# Patient Record
Sex: Male | Born: 1950 | Race: White | Hispanic: No | Marital: Married | Smoking: Former smoker
Health system: Southern US, Community
[De-identification: ages and names within clinical notes are randomized; demographics above are authoritative.]

## PROBLEM LIST (undated history)

## (undated) DIAGNOSIS — E538 Deficiency of other specified B group vitamins: Secondary | ICD-10-CM

## (undated) DIAGNOSIS — E785 Hyperlipidemia, unspecified: Secondary | ICD-10-CM

## (undated) DIAGNOSIS — G20A1 Parkinson's disease without dyskinesia, without mention of fluctuations: Secondary | ICD-10-CM

## (undated) DIAGNOSIS — H269 Unspecified cataract: Secondary | ICD-10-CM

## (undated) DIAGNOSIS — G500 Trigeminal neuralgia: Secondary | ICD-10-CM

## (undated) DIAGNOSIS — G20 Parkinson's disease: Secondary | ICD-10-CM

## (undated) DIAGNOSIS — M1990 Unspecified osteoarthritis, unspecified site: Secondary | ICD-10-CM

## (undated) DIAGNOSIS — I10 Essential (primary) hypertension: Secondary | ICD-10-CM

## (undated) DIAGNOSIS — K219 Gastro-esophageal reflux disease without esophagitis: Secondary | ICD-10-CM

## (undated) DIAGNOSIS — F32A Depression, unspecified: Secondary | ICD-10-CM

## (undated) DIAGNOSIS — C801 Malignant (primary) neoplasm, unspecified: Secondary | ICD-10-CM

## (undated) DIAGNOSIS — E039 Hypothyroidism, unspecified: Secondary | ICD-10-CM

## (undated) DIAGNOSIS — N189 Chronic kidney disease, unspecified: Secondary | ICD-10-CM

## (undated) HISTORY — DX: Malignant (primary) neoplasm, unspecified: C80.1

## (undated) HISTORY — PX: POLYPECTOMY: SHX149

## (undated) HISTORY — DX: Trigeminal neuralgia: G50.0

## (undated) HISTORY — DX: Essential (primary) hypertension: I10

## (undated) HISTORY — DX: Unspecified cataract: H26.9

## (undated) HISTORY — DX: Deficiency of other specified B group vitamins: E53.8

## (undated) HISTORY — DX: Hypothyroidism, unspecified: E03.9

## (undated) HISTORY — DX: Gastro-esophageal reflux disease without esophagitis: K21.9

## (undated) HISTORY — DX: Parkinson's disease without dyskinesia, without mention of fluctuations: G20.A1

## (undated) HISTORY — PX: COLONOSCOPY: SHX174

## (undated) HISTORY — DX: Parkinson's disease: G20

## (undated) HISTORY — DX: Hyperlipidemia, unspecified: E78.5

---
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
## 1989-05-31 HISTORY — PX: OTHER SURGICAL HISTORY: SHX169

## 1993-05-31 HISTORY — PX: OTHER SURGICAL HISTORY: SHX169

## 2003-06-01 HISTORY — PX: OTHER SURGICAL HISTORY: SHX169

## 2004-05-20 ENCOUNTER — Ambulatory Visit: Payer: Self-pay | Admitting: Internal Medicine

## 2004-08-03 ENCOUNTER — Ambulatory Visit: Payer: Self-pay | Admitting: Internal Medicine

## 2005-01-11 ENCOUNTER — Ambulatory Visit: Payer: Self-pay | Admitting: Internal Medicine

## 2005-07-20 ENCOUNTER — Encounter: Admission: RE | Admit: 2005-07-20 | Discharge: 2005-07-20 | Payer: Self-pay | Admitting: Neurology

## 2005-08-20 ENCOUNTER — Ambulatory Visit: Payer: Self-pay | Admitting: Endocrinology

## 2006-02-08 ENCOUNTER — Ambulatory Visit: Payer: Self-pay | Admitting: Internal Medicine

## 2006-05-31 HISTORY — PX: TRIGEMINAL NERVE DECOMPRESSION: SHX2579

## 2006-07-28 ENCOUNTER — Ambulatory Visit: Payer: Self-pay | Admitting: Internal Medicine

## 2006-09-02 ENCOUNTER — Ambulatory Visit: Payer: Self-pay | Admitting: Internal Medicine

## 2006-09-02 LAB — CONVERTED CEMR LAB
Basophils Absolute: 0 10*3/uL (ref 0.0–0.1)
Basophils Relative: 0.4 % (ref 0.0–1.0)
Bilirubin, Direct: 0.1 mg/dL (ref 0.0–0.3)
Eosinophils Absolute: 0.1 10*3/uL (ref 0.0–0.6)
Eosinophils Relative: 2.8 % (ref 0.0–5.0)
Hemoglobin: 14 g/dL (ref 13.0–17.0)
Monocytes Absolute: 0.5 10*3/uL (ref 0.2–0.7)
Neutro Abs: 1.9 10*3/uL (ref 1.4–7.7)
Neutrophils Relative %: 46.1 % (ref 43.0–77.0)
Platelets: 160 10*3/uL (ref 150–400)
WBC: 4.2 10*3/uL — ABNORMAL LOW (ref 4.5–10.5)

## 2006-09-23 ENCOUNTER — Ambulatory Visit: Payer: Self-pay | Admitting: Internal Medicine

## 2006-10-01 ENCOUNTER — Ambulatory Visit: Payer: Self-pay | Admitting: Family Medicine

## 2007-01-16 ENCOUNTER — Encounter
Admission: RE | Admit: 2007-01-16 | Discharge: 2007-04-16 | Payer: Self-pay | Admitting: Physical Medicine & Rehabilitation

## 2007-01-17 ENCOUNTER — Ambulatory Visit: Payer: Self-pay | Admitting: Physical Medicine & Rehabilitation

## 2007-01-23 ENCOUNTER — Ambulatory Visit: Payer: Self-pay | Admitting: Internal Medicine

## 2007-01-23 LAB — CONVERTED CEMR LAB
ALT: 27 units/L (ref 0–53)
AST: 20 units/L (ref 0–37)
Basophils Relative: 0.4 % (ref 0.0–1.0)
CO2: 27 meq/L (ref 19–32)
Creatinine, Ser: 1.1 mg/dL (ref 0.4–1.5)
Eosinophils Relative: 4.1 % (ref 0.0–5.0)
GFR calc non Af Amer: 74 mL/min
Glucose, Bld: 106 mg/dL — ABNORMAL HIGH (ref 70–99)
HCT: 40.5 % (ref 39.0–52.0)
HDL: 24 mg/dL — ABNORMAL LOW (ref >39.0)
Hemoglobin, Urine: NEGATIVE
Ketones, ur: NEGATIVE mg/dL
LDL Cholesterol: 68 mg/dL (ref 0–99)
Lymphocytes Relative: 44 % (ref 12.0–46.0)
MCV: 88.5 fL (ref 78.0–100.0)
Neutrophils Relative %: 40.2 % — ABNORMAL LOW (ref 43.0–77.0)
Nitrite: NEGATIVE
PSA: 0.65 ng/mL (ref 0.10–4.00)
RBC: 4.58 M/uL (ref 4.22–5.81)
Specific Gravity, Urine: >=1.03 (ref 1.000–1.03)
Total Bilirubin: 0.7 mg/dL (ref 0.3–1.2)
Total Protein: 6.4 g/dL (ref 6.0–8.3)
Triglycerides: 102 mg/dL (ref 0–149)
Urobilinogen, UA: 0.2 (ref 0.0–1.0)
VLDL: 20 mg/dL (ref 0–40)
WBC: 4.7 10*3/uL (ref 4.5–10.5)

## 2007-01-26 ENCOUNTER — Ambulatory Visit: Payer: Self-pay | Admitting: Internal Medicine

## 2007-01-27 ENCOUNTER — Encounter: Payer: Self-pay | Admitting: Internal Medicine

## 2007-01-27 DIAGNOSIS — I10 Essential (primary) hypertension: Secondary | ICD-10-CM

## 2007-01-27 DIAGNOSIS — G500 Trigeminal neuralgia: Secondary | ICD-10-CM | POA: Insufficient documentation

## 2007-01-27 DIAGNOSIS — J309 Allergic rhinitis, unspecified: Secondary | ICD-10-CM | POA: Insufficient documentation

## 2007-01-27 DIAGNOSIS — K219 Gastro-esophageal reflux disease without esophagitis: Secondary | ICD-10-CM

## 2007-02-28 ENCOUNTER — Ambulatory Visit: Payer: Self-pay | Admitting: Physical Medicine & Rehabilitation

## 2007-03-18 ENCOUNTER — Encounter: Payer: Self-pay | Admitting: Internal Medicine

## 2007-04-19 ENCOUNTER — Encounter
Admission: RE | Admit: 2007-04-19 | Discharge: 2007-05-19 | Payer: Self-pay | Admitting: Physical Medicine & Rehabilitation

## 2007-04-19 ENCOUNTER — Ambulatory Visit: Payer: Self-pay | Admitting: Physical Medicine & Rehabilitation

## 2007-05-17 ENCOUNTER — Ambulatory Visit: Payer: Self-pay | Admitting: Physical Medicine & Rehabilitation

## 2007-06-14 ENCOUNTER — Encounter
Admission: RE | Admit: 2007-06-14 | Discharge: 2007-09-12 | Payer: Self-pay | Admitting: Physical Medicine & Rehabilitation

## 2007-07-26 ENCOUNTER — Ambulatory Visit: Payer: Self-pay | Admitting: Physical Medicine & Rehabilitation

## 2007-09-07 ENCOUNTER — Ambulatory Visit: Payer: Self-pay | Admitting: Physical Medicine & Rehabilitation

## 2007-09-18 ENCOUNTER — Ambulatory Visit: Payer: Self-pay | Admitting: Internal Medicine

## 2007-09-18 DIAGNOSIS — R310 Gross hematuria: Secondary | ICD-10-CM

## 2007-09-18 LAB — CONVERTED CEMR LAB
Basophils Relative: 0.8 % (ref 0.0–1.0)
Bilirubin Urine: NEGATIVE
Calcium: 8.8 mg/dL (ref 8.4–10.5)
Crystals: NEGATIVE
Eosinophils Relative: 3.6 % (ref 0.0–5.0)
GFR calc non Af Amer: 61 mL/min
Glucose, Bld: 126 mg/dL — ABNORMAL HIGH (ref 70–99)
MCHC: 34.4 g/dL (ref 30.0–36.0)
MCV: 88.3 fL (ref 78.0–100.0)
Monocytes Absolute: 0.5 10*3/uL (ref 0.1–1.0)
Monocytes Relative: 8.4 % (ref 3.0–12.0)
Neutro Abs: 2.9 10*3/uL (ref 1.4–7.7)
Neutrophils Relative %: 53.2 % (ref 43.0–77.0)
RDW: 12.4 % (ref 11.5–14.6)
Sed Rate: 8 mm/hr (ref 0–16)
Sodium: 141 meq/L (ref 135–145)
Total Protein, Urine: NEGATIVE mg/dL
Urine Glucose: NEGATIVE mg/dL
Urobilinogen, UA: 0.2 (ref 0.0–1.0)
WBC: 5.5 10*3/uL (ref 4.5–10.5)
pH: 5.5 (ref 5.0–8.0)

## 2007-09-19 ENCOUNTER — Telehealth: Payer: Self-pay | Admitting: Internal Medicine

## 2007-09-20 ENCOUNTER — Telehealth: Payer: Self-pay | Admitting: Internal Medicine

## 2007-09-21 ENCOUNTER — Encounter: Payer: Self-pay | Admitting: Internal Medicine

## 2007-10-03 ENCOUNTER — Ambulatory Visit: Payer: Self-pay | Admitting: Internal Medicine

## 2007-10-03 DIAGNOSIS — W57XXXA Bitten or stung by nonvenomous insect and other nonvenomous arthropods, initial encounter: Secondary | ICD-10-CM

## 2007-10-03 DIAGNOSIS — T148 Other injury of unspecified body region: Secondary | ICD-10-CM | POA: Insufficient documentation

## 2007-10-11 ENCOUNTER — Encounter: Payer: Self-pay | Admitting: Internal Medicine

## 2007-10-17 ENCOUNTER — Encounter
Admission: RE | Admit: 2007-10-17 | Discharge: 2008-01-15 | Payer: Self-pay | Admitting: Physical Medicine & Rehabilitation

## 2007-10-19 ENCOUNTER — Ambulatory Visit: Payer: Self-pay | Admitting: Physical Medicine & Rehabilitation

## 2007-10-25 ENCOUNTER — Ambulatory Visit: Payer: Self-pay | Admitting: Internal Medicine

## 2007-10-26 ENCOUNTER — Ambulatory Visit: Payer: Self-pay | Admitting: Physical Medicine & Rehabilitation

## 2007-11-17 ENCOUNTER — Ambulatory Visit: Payer: Self-pay | Admitting: Physical Medicine & Rehabilitation

## 2007-11-17 ENCOUNTER — Telehealth: Payer: Self-pay | Admitting: Internal Medicine

## 2007-12-11 ENCOUNTER — Ambulatory Visit: Payer: Self-pay | Admitting: Physical Medicine & Rehabilitation

## 2007-12-19 ENCOUNTER — Encounter: Payer: Self-pay | Admitting: Internal Medicine

## 2007-12-26 ENCOUNTER — Ambulatory Visit: Payer: Self-pay | Admitting: Physical Medicine & Rehabilitation

## 2007-12-26 ENCOUNTER — Ambulatory Visit: Payer: Self-pay | Admitting: Internal Medicine

## 2007-12-26 LAB — CONVERTED CEMR LAB
CO2: 27 meq/L (ref 19–32)
Chloride: 103 meq/L (ref 96–112)
Creatinine, Ser: 1.2 mg/dL (ref 0.4–1.5)

## 2007-12-27 ENCOUNTER — Encounter: Admission: RE | Admit: 2007-12-27 | Discharge: 2007-12-27 | Payer: Self-pay | Admitting: Internal Medicine

## 2007-12-28 ENCOUNTER — Telehealth (INDEPENDENT_AMBULATORY_CARE_PROVIDER_SITE_OTHER): Payer: Self-pay | Admitting: *Deleted

## 2007-12-29 ENCOUNTER — Encounter: Payer: Self-pay | Admitting: Internal Medicine

## 2008-01-01 ENCOUNTER — Ambulatory Visit: Payer: Self-pay | Admitting: Internal Medicine

## 2008-01-03 ENCOUNTER — Telehealth: Payer: Self-pay | Admitting: Internal Medicine

## 2008-01-11 ENCOUNTER — Emergency Department (HOSPITAL_COMMUNITY): Admission: EM | Admit: 2008-01-11 | Discharge: 2008-01-11 | Payer: Self-pay | Admitting: Family Medicine

## 2008-01-12 ENCOUNTER — Telehealth (INDEPENDENT_AMBULATORY_CARE_PROVIDER_SITE_OTHER): Payer: Self-pay | Admitting: *Deleted

## 2008-03-12 ENCOUNTER — Telehealth: Payer: Self-pay | Admitting: Internal Medicine

## 2008-05-31 DIAGNOSIS — E039 Hypothyroidism, unspecified: Secondary | ICD-10-CM

## 2008-05-31 DIAGNOSIS — E538 Deficiency of other specified B group vitamins: Secondary | ICD-10-CM

## 2008-05-31 HISTORY — DX: Hypothyroidism, unspecified: E03.9

## 2008-05-31 HISTORY — DX: Deficiency of other specified B group vitamins: E53.8

## 2008-06-28 ENCOUNTER — Telehealth: Payer: Self-pay | Admitting: Internal Medicine

## 2008-07-30 ENCOUNTER — Ambulatory Visit: Payer: Self-pay | Admitting: Internal Medicine

## 2008-07-30 LAB — CONVERTED CEMR LAB
AST: 22 units/L (ref 0–37)
Alkaline Phosphatase: 45 units/L (ref 39–117)
BUN: 14 mg/dL (ref 6–23)
Basophils Relative: 0.5 % (ref 0.0–3.0)
Bilirubin Urine: NEGATIVE
Bilirubin, Direct: 0.1 mg/dL (ref 0.0–0.3)
Calcium: 9.7 mg/dL (ref 8.4–10.5)
Cholesterol: 159 mg/dL (ref 0–200)
Eosinophils Absolute: 0.1 10*3/uL (ref 0.0–0.7)
Eosinophils Relative: 2.9 % (ref 0.0–5.0)
GFR calc Af Amer: 80 mL/min
Glucose, Bld: 123 mg/dL — ABNORMAL HIGH (ref 70–99)
Hemoglobin: 14.4 g/dL (ref 13.0–17.0)
Ketones, ur: NEGATIVE mg/dL
LDL Cholesterol: 97 mg/dL (ref 0–99)
Leukocytes, UA: NEGATIVE
Lymphocytes Relative: 41.1 % (ref 12.0–46.0)
Monocytes Absolute: 0.5 10*3/uL (ref 0.1–1.0)
Neutro Abs: 1.9 10*3/uL (ref 1.4–7.7)
Neutrophils Relative %: 44 % (ref 43.0–77.0)
Nitrite: NEGATIVE
RBC: 4.68 M/uL (ref 4.22–5.81)
Specific Gravity, Urine: 1.02 (ref 1.000–1.035)
TSH: 5.07 microintl units/mL (ref 0.35–5.50)
Total Bilirubin: 0.8 mg/dL (ref 0.3–1.2)
Total CHOL/HDL Ratio: 5.5
Triglycerides: 169 mg/dL — ABNORMAL HIGH (ref 0–149)
Urine Glucose: NEGATIVE mg/dL
Urobilinogen, UA: 0.2 (ref 0.0–1.0)
VLDL: 34 mg/dL (ref 0–40)

## 2008-08-05 ENCOUNTER — Ambulatory Visit: Payer: Self-pay | Admitting: Internal Medicine

## 2008-08-05 DIAGNOSIS — R7989 Other specified abnormal findings of blood chemistry: Secondary | ICD-10-CM | POA: Insufficient documentation

## 2008-08-05 DIAGNOSIS — E785 Hyperlipidemia, unspecified: Secondary | ICD-10-CM

## 2008-08-29 ENCOUNTER — Telehealth: Payer: Self-pay | Admitting: Internal Medicine

## 2008-10-10 ENCOUNTER — Telehealth: Payer: Self-pay | Admitting: Internal Medicine

## 2008-11-01 ENCOUNTER — Telehealth: Payer: Self-pay | Admitting: Internal Medicine

## 2008-11-26 ENCOUNTER — Encounter (INDEPENDENT_AMBULATORY_CARE_PROVIDER_SITE_OTHER): Payer: Self-pay | Admitting: *Deleted

## 2008-12-04 ENCOUNTER — Ambulatory Visit: Payer: Self-pay | Admitting: Internal Medicine

## 2008-12-04 LAB — CONVERTED CEMR LAB
BUN: 18 mg/dL (ref 6–23)
Basophils Relative: 0.5 % (ref 0.0–3.0)
CO2: 30 meq/L (ref 19–32)
Calcium: 9.3 mg/dL (ref 8.4–10.5)
Creatinine, Ser: 1.1 mg/dL (ref 0.4–1.5)
Eosinophils Absolute: 0.1 10*3/uL (ref 0.0–0.7)
Eosinophils Relative: 3.1 % (ref 0.0–5.0)
GFR calc non Af Amer: 73.1 mL/min (ref >60)
HCT: 45 % (ref 39.0–52.0)
Hgb A1c MFr Bld: 5.6 % (ref 4.6–6.5)
MCHC: 34.3 g/dL (ref 30.0–36.0)
MCV: 90.7 fL (ref 78.0–100.0)
Neutro Abs: 2.3 10*3/uL (ref 1.4–7.7)
Potassium: 4.4 meq/L (ref 3.5–5.1)
RBC: 4.96 M/uL (ref 4.22–5.81)
RDW: 12.6 % (ref 11.5–14.6)
TSH: 5.21 microintl units/mL (ref 0.35–5.50)
Vitamin B-12: 231 pg/mL (ref 211–911)
WBC: 4.7 10*3/uL (ref 4.5–10.5)

## 2008-12-10 ENCOUNTER — Ambulatory Visit: Payer: Self-pay | Admitting: Internal Medicine

## 2008-12-10 DIAGNOSIS — E039 Hypothyroidism, unspecified: Secondary | ICD-10-CM

## 2008-12-10 DIAGNOSIS — E538 Deficiency of other specified B group vitamins: Secondary | ICD-10-CM | POA: Insufficient documentation

## 2008-12-27 ENCOUNTER — Telehealth: Payer: Self-pay | Admitting: Internal Medicine

## 2009-03-11 ENCOUNTER — Ambulatory Visit: Payer: Self-pay | Admitting: Internal Medicine

## 2009-03-12 LAB — CONVERTED CEMR LAB
ALT: 20 units/L (ref 0–53)
Albumin: 4 g/dL (ref 3.5–5.2)
BUN: 17 mg/dL (ref 6–23)
Basophils Absolute: 0 10*3/uL (ref 0.0–0.1)
Basophils Relative: 0.2 % (ref 0.0–3.0)
Bilirubin, Direct: 0.1 mg/dL (ref 0.0–0.3)
CO2: 29 meq/L (ref 19–32)
Chloride: 103 meq/L (ref 96–112)
Eosinophils Relative: 2.7 % (ref 0.0–5.0)
Glucose, Bld: 103 mg/dL — ABNORMAL HIGH (ref 70–99)
Lymphs Abs: 1.7 10*3/uL (ref 0.7–4.0)
MCHC: 34.4 g/dL (ref 30.0–36.0)
MCV: 91 fL (ref 78.0–100.0)
Monocytes Absolute: 0.5 10*3/uL (ref 0.1–1.0)
Neutro Abs: 2.4 10*3/uL (ref 1.4–7.7)
Platelets: 147 10*3/uL — ABNORMAL LOW (ref 150.0–400.0)
Potassium: 4.7 meq/L (ref 3.5–5.1)
RBC: 4.62 M/uL (ref 4.22–5.81)
Rhuematoid fact SerPl-aCnc: <20 intl units/mL (ref 0.0–20.0)
TSH: 3.32 microintl units/mL (ref 0.35–5.50)

## 2009-03-17 ENCOUNTER — Ambulatory Visit: Payer: Self-pay | Admitting: Internal Medicine

## 2009-03-17 DIAGNOSIS — Z720 Tobacco use: Secondary | ICD-10-CM | POA: Insufficient documentation

## 2009-03-17 DIAGNOSIS — Z87891 Personal history of nicotine dependence: Secondary | ICD-10-CM | POA: Insufficient documentation

## 2009-03-19 ENCOUNTER — Telehealth: Payer: Self-pay | Admitting: Internal Medicine

## 2009-06-17 ENCOUNTER — Ambulatory Visit: Payer: Self-pay | Admitting: Internal Medicine

## 2009-06-17 LAB — CONVERTED CEMR LAB
CO2: 27 meq/L (ref 19–32)
Eosinophils Absolute: 0.1 10*3/uL (ref 0.0–0.7)
Eosinophils Relative: 3.2 % (ref 0.0–5.0)
Glucose, Bld: 100 mg/dL — ABNORMAL HIGH (ref 70–99)
Lymphs Abs: 1.9 10*3/uL (ref 0.7–4.0)
MCHC: 33.8 g/dL (ref 30.0–36.0)
MCV: 91.3 fL (ref 78.0–100.0)
Monocytes Absolute: 0.4 10*3/uL (ref 0.1–1.0)
Monocytes Relative: 9.2 % (ref 3.0–12.0)
Neutro Abs: 1.8 10*3/uL (ref 1.4–7.7)
Platelets: 135 10*3/uL — ABNORMAL LOW (ref 150.0–400.0)
RBC: 4.83 M/uL (ref 4.22–5.81)
WBC: 4.2 10*3/uL — ABNORMAL LOW (ref 4.5–10.5)

## 2009-06-23 ENCOUNTER — Ambulatory Visit: Payer: Self-pay | Admitting: Internal Medicine

## 2009-08-11 ENCOUNTER — Telehealth: Payer: Self-pay | Admitting: Internal Medicine

## 2009-12-11 ENCOUNTER — Ambulatory Visit: Payer: Self-pay | Admitting: Internal Medicine

## 2009-12-11 LAB — CONVERTED CEMR LAB
ALT: 25 units/L (ref 0–53)
AST: 18 units/L (ref 0–37)
BUN: 13 mg/dL (ref 6–23)
Basophils Relative: 0.8 % (ref 0.0–3.0)
Bilirubin Urine: NEGATIVE
CO2: 29 meq/L (ref 19–32)
Calcium: 9.2 mg/dL (ref 8.4–10.5)
Creatinine, Ser: 1.1 mg/dL (ref 0.4–1.5)
Eosinophils Relative: 2.3 % (ref 0.0–5.0)
GFR calc non Af Amer: 76.86 mL/min (ref >60)
Glucose, Bld: 102 mg/dL — ABNORMAL HIGH (ref 70–99)
Lymphocytes Relative: 40.7 % (ref 12.0–46.0)
MCV: 89.7 fL (ref 78.0–100.0)
Neutrophils Relative %: 46 % (ref 43.0–77.0)
Nitrite: NEGATIVE
Specific Gravity, Urine: 1.01 (ref 1.000–1.030)
TSH: 5.14 microintl units/mL (ref 0.35–5.50)
Total Bilirubin: 0.6 mg/dL (ref 0.3–1.2)
Total CHOL/HDL Ratio: 4
Triglycerides: 76 mg/dL (ref 0.0–149.0)
Urine Glucose: NEGATIVE mg/dL
Vitamin B-12: 1077 pg/mL — ABNORMAL HIGH (ref 211–911)
pH: 5.5 (ref 5.0–8.0)

## 2009-12-16 ENCOUNTER — Telehealth: Payer: Self-pay | Admitting: Internal Medicine

## 2009-12-16 ENCOUNTER — Ambulatory Visit: Payer: Self-pay | Admitting: Internal Medicine

## 2009-12-16 DIAGNOSIS — R011 Cardiac murmur, unspecified: Secondary | ICD-10-CM

## 2009-12-19 ENCOUNTER — Encounter: Payer: Self-pay | Admitting: Internal Medicine

## 2010-05-01 ENCOUNTER — Telehealth: Payer: Self-pay | Admitting: Internal Medicine

## 2010-06-09 ENCOUNTER — Ambulatory Visit: Admit: 2010-06-09 | Payer: Self-pay | Admitting: Internal Medicine

## 2010-07-01 NOTE — Assessment & Plan Note (Signed)
Summary: 4-6 MO ROV / NWS #   Vital Signs:  Patient profile:   60 year old male Weight:      214 pounds Temp:     98.3 degrees F oral Pulse rate:   59 / minute BP sitting:   116 / 80  (left arm)  Vitals Entered By: Tora Perches (June 23, 2009 8:24 AM) CC: f/u Is Patient Diabetic? No   CC:  f/u.  Preventive Screening-Counseling & Management  Alcohol-Tobacco     Smoking Status: quit  Current Medications (verified): 1)  Trazodone Hcl 100 Mg Tabs (Trazodone Hcl) .Marland Kitchen.. 1 By Mouth At Bedtime 2)  Claritin 10 Mg Tabs (Loratadine) .... Once Daily 3)  Ranitidine Hcl 150 Mg  Tabs (Ranitidine Hcl) .... Two Times A Day 4)  Vitamin D3 1000 Unit  Tabs (Cholecalciferol) .Marland Kitchen.. 1 By Mouth Daily 5)  Vitamin B-12 Cr 1000 Mcg  Tbcr (Cyanocobalamin) .... Take One Tablet By Mouth Daily 6)  Synthroid 25 Mcg Tabs (Levothyroxine Sodium) .... 1/2 By Mouth Qd 7)  Cozaar 100 Mg Tabs (Losartan Potassium) .Marland Kitchen.. 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  Otoscopic examination reveals clear canals, tympanic membranes are intact bilaterally without bulging, retraction, inflammation or  discharge. Hearing is grossly normal bilaterally. Mouth:  throat clear Lungs:  Normal respiratory effort, chest expands symmetrically. Lungs are clear to auscultation, no crackles or wheezes. Heart:  Normal rate and regular rhythm. S1 and S2 normal without gallop, murmur, click, rub or other extra sounds. Extremities:  No clubbing, cyanosis, edema, or deformity noted with normal full range of motion of all joints.   Neurologic:  No cranial nerve deficits noted. Station and gait are normal. Plantar reflexes are down-going bilaterally. DTRs are symmetrical throughout. Sensory, motor and coordinative functions appear intact. Skin:  several 1-1.5 cm lesions on the abdomen in various stages. Definite surrounding erythema. Cervical Nodes:  No lymphadenopathy noted Inguinal Nodes:  No significant adenopathy Psych:  Cognition and judgment appear intact. Alert and cooperative with normal attention span and concentration. No apparent delusions, illusions, hallucinations   Impression & Recommendations:  Problem # 1:  VITAMIN B12 DEFICIENCY (ICD-266.2)  Problem # 2:  HYPOTHYROIDISM (ICD-244.9)  His updated medication list for this problem includes:    Synthroid 25 Mcg Tabs (Levothyroxine sodium) .Marland Kitchen... 1/2 by mouth qd  Problem # 3:  HYPOTHYROIDISM (ICD-244.9)  His updated medication list for this problem includes:    Synthroid 25 Mcg Tabs (Levothyroxine sodium) .Marland Kitchen... 1/2 by mouth qd  Complete Medication List: 1)  Trazodone Hcl 100 Mg Tabs (Trazodone hcl) .Marland Kitchen.. 1 by mouth at bedtime 2)  Claritin 10 Mg Tabs (Loratadine) .... Once daily 3)  Ranitidine Hcl 150 Mg Tabs (Ranitidine hcl) .... Two times a day 4)  Vitamin D3 1000 Unit Tabs (Cholecalciferol) .Marland Kitchen.. 1 by mouth daily 5)  Vitamin B-12 Cr  1000 Mcg Tbcr (Cyanocobalamin) .... Take one tablet by mouth daily 6)  Synthroid 25 Mcg Tabs (Levothyroxine sodium) .... 1/2 by mouth qd 7)  Cozaar 100 Mg Tabs (Losartan potassium) .Marland Kitchen.. 1 by mouth  daily  Patient Instructions: 1)  Please schedule a follow-up appointment in 4- 6 months well w/labs and B12 v70.0  266.20.

## 2010-07-01 NOTE — Progress Notes (Signed)
Summary: Rf Trazodone  Phone Note From Pharmacy   Caller: [facility] Summary of Call: rec rf request for Trazodone 100mg  1 by mouth at bedtime # 30.  I do not see this on active med list. Please advise Initial call taken by: Lanier Prude, [facility]),  May 01, 2010 1:17 PM  Follow-up for Phone Call        ok x 6 months  Follow-up by: Tresa Garter MD,  May 01, 2010 5:35 PM  Additional Follow-up for Phone Call Additional follow up Details #1::        Pt informed  Additional Follow-up by: Lamar Sprinkles, CMA,  May 01, 2010 5:40 PM    New/Updated Medications: TRAZODONE HCL 100 MG TABS (TRAZODONE HCL) 1 at bedtime as needed Prescriptions: TRAZODONE HCL 100 MG TABS (TRAZODONE HCL) 1 at bedtime as needed  #30 x 5   Entered by:   Lamar Sprinkles, CMA   Authorized by:   Tresa Garter MD   Signed by:   Lamar Sprinkles, CMA on 05/01/2010   Method used:   Telephoned to ...       [facility] [phone]* (retail)       [address] [place], [place]  [address]       Ph: [phone]       Fax: [phone]   RxID:   [id number]

## 2010-07-01 NOTE — Assessment & Plan Note (Signed)
Summary: cpx / # cd   Vital Signs:  Patient profile:   60 year old male Height:      69 inches (175.26 cm) Weight:      212 pounds (96.36 kg) BMI:     31.42 O2 Sat:      96 % on Room air Temp:     97.5 degrees F (36.39 degrees C) oral Pulse rate:   58 / minute Pulse rhythm:   regular Resp:     16 per minute BP sitting:   114 / 82  (left arm) Cuff size:   regular  Vitals Entered By: Lanier Prude, CMA(AAMA) (December 16, 2009 8:40 AM)  O2 Flow:  Room air CC: CPX Is Patient Diabetic? No   Primary Care Provider:  Norins  CC:  CPX.  History of Present Illness: The patient presents for a wellness examination   Current Medications (verified): 1)  Claritin 10 Mg Tabs (Loratadine) .... Once Daily 2)  Ranitidine Hcl 150 Mg  Tabs (Ranitidine Hcl) .... Two Times A Day 3)  Vitamin D3 1000 Unit  Tabs (Cholecalciferol) .Marland Kitchen.. 1 By Mouth Daily 4)  Vitamin B-12 Cr 1000 Mcg  Tbcr (Cyanocobalamin) .... Take One Tablet By Mouth Daily 5)  Synthroid 25 Mcg Tabs (Levothyroxine Sodium) .... 1/2 By Mouth Qd 6)  Cozaar 100 Mg Tabs (Losartan Potassium) .... 1/2 By Mouth Daily 7)  Fish Oil 1200 Mg Caps (Omega-3 Fatty Acids) .Marland Kitchen.. 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: 12 years 1st;  15 years ('93) 2nd one daughter, step daughter, step-son likes to bike  Past Surgical History: Inguinal herniorrhaphy-left Tonsillectomy gamma knife surgery for trigeminal neuralgia 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  Dr Garnette Czech Colon 8 y ago   Social History: Occupation: Presenter, 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: 12 years 1st;  15 years ('93) 2nd one daughter, step daughter, step-son likes to bike  Review of Systems  The patient denies anorexia, fever, weight loss, weight gain, vision loss, decreased hearing, hoarseness, chest pain, syncope, dyspnea on exertion, peripheral edema, prolonged cough, headaches, hemoptysis, abdominal pain, melena, hematochezia, severe indigestion/heartburn, hematuria, incontinence, genital sores, muscle weakness, suspicious skin lesions, transient blindness, difficulty walking, depression, unusual weight change, abnormal bleeding, enlarged lymph nodes, angioedema, and testicular masses.    Physical Exam  General:  Well-developed,well-nourished,in no acute distress; alert,appropriate and cooperative throughout examination Head:  Normocephalic and atraumatic without obvious abnormalities. No apparent alopecia or balding. Eyes:  No corneal or conjunctival inflammation noted. EOMI. Perrla.  Ears:  dry wax B Nose:  External nasal examination shows no deformity or inflammation. Nasal mucosa are pink and moist without lesions or exudates. Mouth:  Oral mucosa and oropharynx without lesions or exudates.  Teeth in good repair. Neck:  No deformities, masses, or tenderness noted. Lungs:  Normal respiratory effort, chest expands symmetrically. Lungs are clear to auscultation, no crackles or wheezes. Heart:  1/6 systolic heart murmur  Abdomen:  Bowel sounds positive,abdomen soft and non-tender without masses, organomegaly or hernias noted. Msk:  No deformity or scoliosis noted of thoracic or lumbar spine.   Extremities:  No clubbing, cyanosis, edema, or deformity noted with normal full range of motion of all joints.   Neurologic:  No cranial nerve deficits noted. Station and gait are normal. Plantar reflexes are down-going bilaterally. DTRs are symmetrical throughout. Sensory,  motor and coordinative functions appear intact. Skin:  several 1-1.5 cm lesions on the abdomen in various stages. Definite surrounding erythema. Cervical Nodes:  No lymphadenopathy noted Inguinal Nodes:  No significant adenopathy Psych:  Cognition and judgment appear intact. Alert and cooperative with normal attention span and concentration. No apparent delusions, illusions, hallucinations   Impression & Recommendations:  Problem # 1:  WELL ADULT EXAM (ICD-V70.0) Assessment New Health and age related issues were discussed. Available screening tests and vaccinations were discussed as well. Healthy life style including good diet and execise was discussed.   Problem # 2:  LVH on EKG/ with a very  faint heart murmur - most likely all nl Assessment: New We can get an ECHO later Get old EKG  Problem # 3:  VITAMIN B12 DEFICIENCY (ICD-266.2) Assessment: Comment Only On drug  therapy   Problem # 4:  HYPOTHYROIDISM (ICD-244.9) Assessment: Comment Only  His updated medication list for this problem includes:    Synthroid 25 Mcg Tabs (Levothyroxine sodium) .Marland Kitchen... 1 by mouth qd  Complete Medication List: 1)  Claritin 10 Mg Tabs (Loratadine) .... Once daily 2)  Ranitidine Hcl 150 Mg Tabs (Ranitidine hcl) .... Two times a day 3)  Vitamin D3 1000 Unit Tabs (Cholecalciferol) .Marland Kitchen.. 1 by mouth daily 4)  Vitamin B-12 Cr 1000 Mcg Tbcr (Cyanocobalamin) .... Take one tablet by mouth daily 5)  Synthroid 25 Mcg Tabs (Levothyroxine sodium) .Marland Kitchen.. 1 by mouth qd 6)  Fish Oil 1200 Mg Caps (Omega-3 fatty acids) .Marland Kitchen.. 1 once daily 7)  Cozaar 50 Mg Tabs (Losartan potassium) .Marland Kitchen.. 1 by mouth qd  Other Orders: EKG w/ Interpretation (93000) TD Toxoids IM 7 YR + ([address]) Admin 1st Vaccine (60454) Echo Referral (Echo)  Patient Instructions: 1)  Please schedule a follow-up appointment in 6 months. 2)  BMP prior to visit, ICD-9: 3)  Lipid Panel prior to visit, ICD-9: 4)  TSH prior to visit, ICD-9: 5)  CBC w/ Diff  prior to visit, ICD-9: 6)  PSA prior to visit, ICD-9: 244.80 266 20 995.20 7)  Vit B12 Prescriptions: SYNTHROID 25 MCG TABS (LEVOTHYROXINE SODIUM) 1 by mouth qd  #30 x 12   Entered and Authorized by:   Tresa Garter MD   Signed by:   Tresa Garter MD on 12/16/2009   Method used:   Electronically to        [facility]  [phone]* (retail)       [address]       [place], [place]  [address]       Ph: [phone] or [phone]       Fax: [phone]   RxID:   [id number] SYNTHROID 25 MCG TABS (LEVOTHYROXINE SODIUM) 1/2 by mouth qd  #30 x 12   Entered and Authorized by:   Tresa Garter MD   Signed by:   Tresa Garter MD on 12/16/2009   Method used:   Electronically to        [facility]  [phone]* (retail)       [address]       [place], [place]  [address]  Ph: [phone] or [phone]       Fax: [phone]   RxID:   [id number] COZAAR 50 MG TABS (LOSARTAN POTASSIUM) 1 by mouth qd  #30 x 12   Entered and Authorized by:   Tresa Garter MD   Signed by:   Tresa Garter MD on 12/16/2009   Method used:   Electronically to        [facility]  [phone]* (retail)       [address]       [place], [place]  [address]       Ph: [phone] or [phone]       Fax: [phone]   RxID:   [id number]    Immunizations Administered:  Tetanus Vaccine:    Vaccine Type: Td    Site: left deltoid    Mfr: Sanofi Pasteur    Dose: 0.5 ml    Route: IM    Given by: Lanier Prude, CMA(AAMA)    Exp. Date: 07/02/2011    Lot #: [id number]    VIS given: 04/18/07 version given December 16, 2009.

## 2010-07-01 NOTE — Miscellaneous (Signed)
Summary: Appointment Canceled  Appointment status changed to canceled by [employer] on 12/19/2009 10:59 AM.  Cancellation Comments --------------------- echo/murmur, systolic  Appointment Information ----------------------- Appt Type:  CARDIOLOGY ANCILLARY VISIT      Date:  Thursday, December 25, 2009      Time:  1:00 PM for 60 min   Urgency:  Routine   Made By:  Hoy Finlay Scheduler  To Visit:  [id number]    Reason:  echo/murmur, systolic  Appt Comments ------------- -- 12/19/09 10:59: ([facility]) CANCELED -- echo/murmur, systolic -- 12/16/09 9:57: ([facility]) BOOKED -- Routine CARDIOLOGY ANCILLARY VISIT at 12/25/2009 1:00 PM for 60 min echo/murmur, systolic

## 2010-07-01 NOTE — Progress Notes (Signed)
Summary: EKG  Phone Note Call from Patient Call back at Home Phone [phone]   Caller: Spouse-Diane Summary of Call: Spouse left message on triage stating that patient has had several normal EKGs in the past. An electrocardiogram was done today. She would like to know if EKG can be re-done prior to electrocardiogram due to insurance. She also would like to know if the patient should start Coq10. Please advise. Initial call taken by: Lucious Groves CMA,  December 16, 2009 4:11 PM  Follow-up for Phone Call        No need for CoQ Yes previous EKGs were nl. EKG can be redone. What is the problem w/insurence? Follow-up by: Tresa Garter MD,  December 16, 2009 5:51 PM  Additional Follow-up for Phone Call Additional follow up Details #1::        Do you need to see pt for office visit? Or nurse visit only?  Is heart murmur new? It is a sound dx by exam by MD? Not detected by EKG correct? Murmur should be enough for echo coverage I believe. Additional Follow-up by: Lamar Sprinkles, CMA,  December 16, 2009 6:09 PM    Additional Follow-up for Phone Call Additional follow up Details #2::    Spoke w/MD, No office visit or additional EKG needed prior to echo. Murmur was slight and MD needs confirmation that everything is ok w/pt's heart, which is reason for echo. Need to clarify the reason why insurance may not cover test. left mess to call office back..............Marland KitchenLamar Sprinkles, CMA  December 17, 2009 5:47 PM  Spoke w/pt's wife. They will have to pay out of pocket for ECHO (approx 2,000) due to insurance. No matter what code is used. Pt also is still in 90 day window, during this first 90 days insurance can change the premium at any time. They are worried that this test may cause an increase in premium. Can this wait until next office visit in January or at least until the 90 day period? If it is absolutely necessary pt will pay for test but they wanted to make sure. Please advise. Follow-up by: Lamar Sprinkles,  CMA,  December 18, 2009 11:06 AM  Additional Follow-up for Phone Call Additional follow up Details #3:: Details for Additional Follow-up Action Taken: Yes, let's wait. Thx Additional Follow-up by: Tresa Garter MD,  December 19, 2009 7:28 AM  informed pt /Ami Bullins CMA  December 19, 2009 9:07 AM

## 2010-07-01 NOTE — Progress Notes (Signed)
Summary: STOPPED MED  Phone Note Call from Patient Call back at Home Phone [phone] Call back at [phone]   Summary of Call: Patient is requesting trazodone to be removed from his med list, he has stopped taking this medication.  Initial call taken by: Lamar Sprinkles, CMA,  August 11, 2009 2:16 PM  Follow-up for Phone Call        OK Follow-up by: Tresa Garter MD,  August 13, 2009 5:11 PM

## 2010-08-06 ENCOUNTER — Other Ambulatory Visit: Payer: Self-pay

## 2010-08-13 ENCOUNTER — Ambulatory Visit: Payer: Self-pay | Admitting: Internal Medicine

## 2010-10-13 NOTE — Procedures (Signed)
NAME:  Adrian [name], Adrian [name] NO.:  [id number]   MEDICAL RECORD NO.:  [id number]          PATIENT TYPE:  REC   LOCATION:  [facility]                          FACILITY:  [facility]   PHYSICIAN:  Erick Colace, M.D.DATE OF BIRTH:  [birth year]   DATE OF PROCEDURE:  DATE OF DISCHARGE:                               OPERATIVE REPORT   Mr. Waterworth returns today.  He is for acupuncture treatment.  He has had  about one week intervals between visits and has maintained some pain  control during that period of time.  The pain was 2-3/10.   Treatment today consists of bilateral LI's namely four bilateral ST's  x36.   Left ST-2, BL-2, SI-19 and DU-24.5 as well as GB-13 E-Stim 20 Hz in the  facial needles and 4 Hz in the limb needles.   Patient tolerated the procedure well, 30 minute treatment time.  We will  see him back in 10 days.      Erick Colace, M.D.  Electronically Signed     AEK/MEDQ  D:  03/14/2007 10:58:17  T:  03/14/2007 20:14:55  Job:  [id number]

## 2010-10-13 NOTE — Procedures (Signed)
NAME:  Adrian [name], Adrian [name] NO.:  [id number]   MEDICAL RECORD NO.:  [id number]          PATIENT TYPE:  REC   LOCATION:  [facility]                          FACILITY:  [facility]   PHYSICIAN:  Erick Colace, M.D.DATE OF BIRTH:  [birth year]   DATE OF PROCEDURE:  05/18/2007  DATE OF DISCHARGE:                               OPERATIVE REPORT   PROCEDURE:  Acupuncture.   INDICATIONS FOR PROCEDURE:  Trigeminal neuralgia which is only partially  responsive to other treatments.  Has had good relief with one interval  between treatments.  Acupuncture performed on the left has V2 trigeminal  neuralgia.  Points DU24.5, GB13, SI19, BL2, ST2.  20 hertz stim between  SI19 and BL2, ST2, and 4 hertz stimulation between LI4 and ST36  bilaterally.  Return in one month.  Treatment time 30 minutes.      Erick Colace, M.D.  Electronically Signed     AEK/MEDQ  D:  05/18/2007 09:35:22  T:  05/18/2007 16:16:20  Job:  [id number]

## 2010-10-13 NOTE — Procedures (Signed)
NAME:  [name], Adrian [name]:  [id number]   MEDICAL RECORD [name]:  [id number]          PATIENT TYPE:  REC   LOCATION:  [facility]                          FACILITY:  [facility]   PHYSICIAN:  Erick Colace, M.D.DATE OF BIRTH:  [birth year]   DATE OF PROCEDURE:  01/24/2007  DATE OF DISCHARGE:                               OPERATIVE REPORT   Acupuncture treatment #1 for trigeminal neuralgia.   Treatment today consisted of points placed at left SI19, left ST2, left  CL2, left ST4, left RN24 e stim  between SI19 and ST2 150 Hz x20  minutes.  The patient tolerated the procedure well.  Repeat one week.  Will increase to  30 minute session.      Erick Colace, M.D.  Electronically Signed     AEK/MEDQ  D:  01/24/2007 16:25:31  T:  01/25/2007 11:27:03  Job:  [id number]   cc:   Rosalyn Gess. Norins, MD  [address]  [place]  [place] [address]

## 2010-10-13 NOTE — Procedures (Signed)
NAME:  ESKER, DEVER NO.:  [id number]   MEDICAL RECORD NO.:  [id number]           PATIENT TYPE:   LOCATION:                                 FACILITY:   PHYSICIAN:  Erick Colace, M.D.DATE OF BIRTH:  12-03-1950   DATE OF PROCEDURE:  DATE OF DISCHARGE:                               OPERATIVE REPORT   PROCEDURE:  Acupuncture treatment #5.   OPERATOR:  Erick Colace, M.D.   INDICATIONS FOR PROCEDURE:  For trigeminal neuralgia.   DESCRIPTION OF PROCEDURE:  The needle is placed at DU-24.5, T2, SI-19,  GB-13, estim  between SI-19 and T2 and the L2.  Treatment time was 30  minutes.  The patient tolerated the procedure well.      Erick Colace, M.D.  Electronically Signed     AEK/MEDQ  D:  02/21/2007 15:22:27  T:  02/22/2007 09:34:53  Job:  [id number]

## 2010-10-13 NOTE — Procedures (Signed)
NAME:  Adrian [name], Adrian [name] NO.:  [id number]   MEDICAL RECORD NO.:  [id number]          PATIENT TYPE:  REC   LOCATION:  [facility]                          FACILITY:  [facility]   PHYSICIAN:  Erick Colace, M.D.DATE OF BIRTH:  Aug 01, 1950   DATE OF PROCEDURE:  04/19/2007  DATE OF DISCHARGE:                               OPERATIVE REPORT   Acupuncture treatment performed on the left for trigeminal neuralgia.  DU24.5, GB13, SI19, BL2, and ST2, inbetween SI19, ST1, SI19, and BL2, as  well as LI4 and ST36.  4 hertz stimulation, 30 minutes, with the  exception of 20 hertz between BL2 and SI19.  Return in one month.      Erick Colace, M.D.  Electronically Signed     AEK/MEDQ  D:  04/20/2007 11:12:09  T:  04/20/2007 17:44:04  Job:  [id number]

## 2010-10-13 NOTE — Procedures (Signed)
NAME:  Adrian [name], Adrian [name] NO.:  [id number]   MEDICAL RECORD NO.:  [id number]          PATIENT TYPE:  REC   LOCATION:  [facility]                          FACILITY:  [facility]   PHYSICIAN:  Erick Colace, M.D.DATE OF BIRTH:  28-Jun-1950   DATE OF PROCEDURE:  02/14/2007  DATE OF DISCHARGE:                               OPERATIVE REPORT   REASON FOR VISIT:  Acupuncture treatment #4.   He is being treated for trigeminal neuralgia on V1, V2 on the left side.  Needle was placed on the left at SI19 bilateral BL2, bilateral ST2, and  left GB13 as well as DU24.5.  The stim was between right BL2 and ST2 and  between left SI19 to BL2, left SI19 to ST2 4 Hz x 30 minutes.  The  patient tolerated the procedure well.  Pain level 3 to 4 out of 10  currently.      Erick Colace, M.D.  Electronically Signed     AEK/MEDQ  D:  02/14/2007 15:46:58  T:  02/15/2007 08:05:03  Job:  [id number]   cc:   Rosalyn Gess. Norins, MD  [address]  [place]  [place] [address]

## 2010-10-13 NOTE — Assessment & Plan Note (Signed)
[facility]                                 ON-CALL NOTE   SENAN, UREY                           MRN:          [id number]  DATE:09/17/2007                            DOB:          [birth year]    TELEPHONE TRIAGE NOTE:   TIME RECEIVED:  11:38 a.m.   CALLER:  The patient's wife.   PRIMARY CARE PHYSICIAN:  Rosalyn Gess. Norins, M.D.   Arnoldo Hooker NUMBER:  [phone].   The patient say some blood in his urine last night, and it happened  again this morning.  He has no symptoms at all.  No burning, no pain, no  fever, etc.  My advice is to drink plenty of water today and to be seen  in the clinic tomorrow.     Tera Mater. Clent Ridges, MD  Electronically Signed    SAF/MedQ  DD: 09/17/2007  DT: 09/17/2007  Job #: [id number]

## 2010-10-13 NOTE — Procedures (Signed)
NAME:  Adrian [name], Adrian [name] NO.:  [id number]   MEDICAL RECORD NO.:  [id number]          PATIENT TYPE:  REC   LOCATION:  [facility]                          FACILITY:  [facility]   PHYSICIAN:  Erick Colace, M.D.DATE OF BIRTH:  01/27/1951   DATE OF PROCEDURE:  DATE OF DISCHARGE:                               OPERATIVE REPORT   ACUPUNCTURE TREATMENT   INDICATIONS FOR PROCEDURE:  Trigeminal neuralgia which is only partially  responsive to other treatments.  He has had good relief of a 6 week  interval between treatments.   Acupuncture performed on the left at SI 19, BL 2, ST 2, bilateral LI 4,  bilateral ST 36, left GB 13 and left D 24.5.  E-stem between SI 19, BL  2, ST 2, 20 Hz, and between LI 4, ST 36, 4 Hz.  Treatment time 30  minutes.  Return in 6 weeks for repeat treatment.      Erick Colace, M.D.  Electronically Signed     AEK/MEDQ  D:  09/07/2007 08:38:11  T:  09/07/2007 08:58:31  Job:  [id number]

## 2010-10-13 NOTE — Procedures (Signed)
NAME:  OZ, GAMMEL NO.:  [id number]   MEDICAL RECORD NO.:  [id number]          PATIENT TYPE:  REC   LOCATION:  [facility]                          FACILITY:  [facility]   PHYSICIAN:  Erick Colace, M.D.DATE OF BIRTH:  June 15, 1950   DATE OF PROCEDURE:  11/17/2007  DATE OF DISCHARGE:                               OPERATIVE REPORT   Mr. Groeneveld returns today for acupuncture treatment.  His flare up of  trigeminal neuralgia pain is subsiding down to 3, was about 4-5 at last  visit, which was about 3 weeks ago.   Treatments today consists of needles placed at acupuncture points, 24.5,  GB 13 on the left and left SI 19, BL 2, and ST 2.  E-Stim needle 20 Hz  x30 minutes.  In addition, needles placed between LI 4, ST 36, 4 Hz x30  minutes.  The patient tolerated the procedure well.  Postprocedure  instructions was given.  Return in 3 weeks.      Erick Colace, M.D.  Electronically Signed     AEK/MEDQ  D:  11/17/2007 14:01:43  T:  11/18/2007 03:37:00  Job:  [id number]

## 2010-10-13 NOTE — Procedures (Signed)
NAME:  Adrian [name], Adrian [name] NO.:  [id number]   MEDICAL RECORD NO.:  [id number]          PATIENT TYPE:  REC   LOCATION:  [facility]                          FACILITY:  [facility]   PHYSICIAN:  Erick Colace, M.D.DATE OF BIRTH:  07-14-1950   DATE OF PROCEDURE:  01/31/2007  DATE OF DISCHARGE:                               OPERATIVE REPORT   Acupuncture treatment for trigeminal neuralgia.  Treatment #2.  Treatment today consists of points placed left SI19, left BL2, left ST2,  left GB13.  Electrical stimulation between SI19,CL2 and SI19, SE2 4 hz x  30 minutes.  The patient tolerated the procedure well.      Erick Colace, M.D.  Electronically Signed     AEK/MEDQ  D:  01/31/2007 17:48:49  T:  02/01/2007 05:50:51  Job:  [id number]   cc:   Rosalyn Gess. Norins, MD  [address]  [place]  [place] [address]

## 2010-10-13 NOTE — Procedures (Signed)
NAME:  Adrian [name], Adrian [name] NO.:  [id number]   MEDICAL RECORD NO.:  [id number]          PATIENT TYPE:  REC   LOCATION:  [facility]                          FACILITY:  [facility]   PHYSICIAN:  Erick Colace, M.D.DATE OF BIRTH:  Oct 13, 1950   DATE OF PROCEDURE:  DATE OF DISCHARGE:                               OPERATIVE REPORT   Acupuncture treatment.  Last treatment performed 3 weeks ago.  Has had  gradually diminishing tingling pain in his facial region.  His pain is  down to about a 2 at this point.  He had an exacerbation and we went 5  weeks in terms of spacing between treatments.   Treatment today consisted of gallbladder 13 on the left, DU 24.5, as  well as left SI 19, BL 2, ST 2, 20 Hz stem x30 minutes.  In addition, a  needle was placed between large intestine 4, ST 36.4 Hz x30 minutes.  The patient tolerated the procedure well.  Repeat in 3 weeks.  If doing  well at that point, extend out the 4.      Erick Colace, M.D.  Electronically Signed     AEK/MEDQ  D:  12/11/2007 08:57:32  T:  12/12/2007 01:24:15  Job:  [id number]

## 2010-10-13 NOTE — Procedures (Signed)
NAME:  Adrian [name], Adrian [name] NO.:  [id number]   MEDICAL RECORD NO.:  [id number]          PATIENT TYPE:  REC   LOCATION:  [facility]                          FACILITY:  [facility]   PHYSICIAN:  Erick Colace, M.D.DATE OF BIRTH:  [birth year]   DATE OF PROCEDURE:  02/28/2007  DATE OF DISCHARGE:                               OPERATIVE REPORT   REASON FOR VISIT:  The patient returns for acupuncture treatment,  trigeminal neuralgia.   SUBJECTIVE:  He has had good relief for the first 2 days, then had some  flareup of pain 2 days later, but then some continued relief for the  last 2 days.   No other new issues.  No sinus pain.   TREATMENT:  Needles placed at DU24.5, left GB13, left ST2, left BL2 and  left SI 19.  E-stim between left ST2 and SI 19 and between left BL2 and  SI 19 4 Hz x 30 minutes.  The patient tolerated the procedure well.  Current pain level 3 to 4.      Erick Colace, M.D.  Electronically Signed     AEK/MEDQ  D:  02/28/2007 09:53:59  T:  02/28/2007 11:32:19  Job:  [id number]

## 2010-10-13 NOTE — Procedures (Signed)
NAME:  Adrian [name], Adrian [name] NO.:  [id number]   MEDICAL RECORD NO.:  [id number]          PATIENT TYPE:  REC   LOCATION:  [facility]                          FACILITY:  [facility]   PHYSICIAN:  Erick Colace, M.D.DATE OF BIRTH:  01-Dec-1950   DATE OF PROCEDURE:  DATE OF DISCHARGE:                               OPERATIVE REPORT   Treatment consisted of left SI-19, left ST-2, left BL-2, and left GB-14  as well as DU-25.5 as well as bilateral LI-4 and ST-36.   All limb needles with 4 Hz stim, the facial with 20.  Patient tolerated  the procedure well.      Erick Colace, M.D.  Electronically Signed     AEK/MEDQ  D:  03/14/2007 10:59:09  T:  03/14/2007 20:16:32  Job:  [id number]

## 2010-10-13 NOTE — Procedures (Signed)
NAME:  Adrian [name], CASEBOLT NO.:  [id number]   MEDICAL RECORD NO.:  [id number]          PATIENT TYPE:  REC   LOCATION:  [facility]                          FACILITY:  [facility]   PHYSICIAN:  Erick Colace, M.D.DATE OF BIRTH:  [birth year]   DATE OF PROCEDURE:  10/19/2007  DATE OF DISCHARGE:                               OPERATIVE REPORT   PROCEDURE:  Acupuncture.   Last treatment September 07, 2007.   INDICATIONS FOR PROCEDURE:  Trigeminal neuralgia which is only partially  responsive to other treatments.  He feels like the last treatment only  lasted about 4 weeks.   DESCRIPTION OF PROCEDURE:  Acupuncture performed on the left SI-19, BL-  2, ST-2, bilateral LI-4, bilateral ST-36, left GB-13, and left DU-24.5.  E-stem between SI-19, BL-2, ST-2 at 4 Hz and LI-4, ST-36 at 4 Hz.  Treatment time; 30 minutes.  Return in 4 weeks for repeat treatment.      Erick Colace, M.D.  Electronically Signed     AEK/MEDQ  D:  10/19/2007 13:20:02  T:  10/19/2007 13:46:32  Job:  [id number]

## 2010-10-13 NOTE — Procedures (Signed)
NAME:  LUI, BELLIS NO.:  [id number]   MEDICAL RECORD NO.:  [id number]          PATIENT TYPE:  REC   LOCATION:  [facility]                          FACILITY:  [facility]   PHYSICIAN:  Erick Colace, M.D.DATE OF BIRTH:  03-Jan-1951   DATE OF PROCEDURE:  DATE OF DISCHARGE:                               OPERATIVE REPORT   Mr. Reaver returns today for acupuncture treatment.  He had a flare-up of  his trigeminal neuralgia pain, causing 4-5/10 pain rather consistently  now.  He had last treatment approximately 1 week ago.   Treatment today consisted of needles placed at acupuncture points, DU  24.5, left GB 13.  Needles also placed at SI 19, BL 2, ST 2, with  electrical stimulation between the needles, 20 Hz x30 minutes.  In  addition, needles placed between LI 4, ST 36, 4 Hz x30 minutes.  The  patient tolerated the procedure well.  Postprocedure instructions were  given.       Erick Colace, M.D.  Electronically Signed     AEK/MEDQ  D:  10/26/2007 15:31:43  T:  10/27/2007 02:43:22  Job:  [id number]

## 2010-10-13 NOTE — Procedures (Signed)
NAME:  HALO, LASKI NO.:  [id number]   MEDICAL RECORD NO.:  [id number]          PATIENT TYPE:  REC   LOCATION:  [facility]                          FACILITY:  [facility]   PHYSICIAN:  Erick Colace, M.D.DATE OF BIRTH:  [birth year]   DATE OF PROCEDURE:  02/07/2007  DATE OF DISCHARGE:                               OPERATIVE REPORT   PROCEDURE PERFORMED:  Acupuncture trigeminal neuralgia.   This is the third treatment and he felt about a three day relief after  the last treatment.  Needles placed on the left side at left SI19, left  BL2, left ST2, left GB13.  Electrical stimulation between SI19 and  between BL2 as well as SI19 to ST2 4 Hz x30 minutes.  The patient  tolerated the procedure well.      Erick Colace, M.D.  Electronically Signed     AEK/MEDQ  D:  02/07/2007 16:45:25  T:  02/08/2007 11:24:59  Job:  [id number]   cc:   Rosalyn Gess. Norins, MD  [address]  [place]  [place] [address]

## 2010-10-13 NOTE — Procedures (Signed)
NAME:  JOSEF, TOURIGNY NO.:  [id number]   MEDICAL RECORD NO.:  [id number]          PATIENT TYPE:  REC   LOCATION:  [facility]                          FACILITY:  [facility]   PHYSICIAN:  Erick Colace, M.D.DATE OF BIRTH:  1950-08-02   DATE OF PROCEDURE:  DATE OF DISCHARGE:                               OPERATIVE REPORT   PROCEDURE:  Acupuncture.   INDICATIONS:  Trigeminal neuralgia with only partial relief, using  medication and surgical management.   He has had improvement in pain over the last 10 days.  This has been the  longest he has gone between acupuncture treatments, and his pain is  still down at a 2/10 on most days.  Needles placed at DU-24.5 on the  left, GB-13 on the left, SD-2, left SI-19.  E-Stim between SD-2, SI-19;  in between left BL-2 and SI-19, 20 Hz x30 minutes as well as needle Stim  between LI-4 and ST-36 bilaterally, 4 Hz.   Patient tolerate procedure well.      Erick Colace, M.D.  Electronically Signed     AEK/MEDQ  D:  03/24/2007 15:34:20  T:  03/25/2007 20:10:57  Job:  [id number]

## 2010-10-13 NOTE — Procedures (Signed)
NAME:  Adrian [name], Adrian [name] NO.:  [id number]   MEDICAL RECORD NO.:  [id number]          PATIENT TYPE:  REC   LOCATION:  [facility]                          FACILITY:  [facility]   PHYSICIAN:  Erick Colace, M.D.DATE OF BIRTH:  [birth year]   DATE OF PROCEDURE:  06/15/2007  DATE OF DISCHARGE:                               OPERATIVE REPORT   PROCEDURE:  Acupuncture.   INDICATION:  Trigeminal neuralgia only partially responsive to other  treatments.  He has had good relief over the last four weeks.   DESCRIPTION OF PROCEDURE:  Points DU 24.5, GP 13, BO 2, ST 2, on the  left utilized, as well as bilateral LI 4 and ST 36.  Twenty hertz in  between SI 19, BL 2 and ST 2 and four hertz in between LI 4 and ST 36  bilaterally.   PLAN:  Return in 5 weeks.  The patient tolerated the procedure well.   TREATMENT TIME:  30 minutes.      Erick Colace, M.D.  Electronically Signed     AEK/MEDQ  D:  06/15/2007 11:12:05  T:  06/15/2007 12:16:56  Job:  [id number]

## 2010-10-13 NOTE — Group Therapy Note (Signed)
CONSULT REQUESTED BY:  Dr. Wyonia Hough.   REASON FOR CONSULTATION:  Evaluation of left-sided trigeminal neuralgia.   HISTORY:  The patient is a 60 year old male who had onset of left-sided  facial pain, mainly in the maxillary area and around the ear, starting  shortly after having some dental work in that area.  This was March of  2006 and was initially treated with antibiotics, had some dental imaging  studies, none of which showed any structural lesion or any evidence of  infection.  He went to his PCP, Dr. Debby Bud, who felt this was likely  trigeminal neuralgia, and was referred to neurology and the neurology  evaluation was performed January 12, 2005, which concurred with probable  trigeminal neuralgia.  He was started on carbamazepine which helped with  the pain, but he could not tolerate the sedation and cognitive side  effects.  He, therefore, was switched to Lyrica which did help initially  and he went up to 150 t.i.d.  Upon reevaluation by Dr. Orlin Hilding on  June 06, 2005, was started on a taper, given that he had a good  effect.  However, after being tapered off, he felt his symptoms recur.  At some point, his pain spread from the V2 segment to V1 and V3 as well  with retrial done, Lyrica, but was not successful, and, therefore, went  to a trigeminal pain clinic in [place], and underwent gamma knife  resection, 2007.  He was pain free for several months after this;  however, the pain started coming back this year.  He has had no change  in his symptoms.  He denies any visual problems or balance problems or  swallowing problems.  He does note a bit of numbness on the left side of  the face.  He has no problem with chewing or eating.  His pain is rated  as a 3-4 out of 10 which is certainly better than it was prior to the  gamma knife surgery.  His sleep is good.  Cold tends to exacerbate his  pain.  Rest and aerobic exercise seems to make it better.  His  functional status is  totally independent.  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   He has had a nerve study in the past.   SOCIAL HISTORY:  Married, his wife accompanies him today.   FAMILY HISTORY:  Positive for heart disease, diabetes, high blood  pressure.   PHYSICAL EXAMINATION:  His blood pressure today is 128/73, pulse 18, O2  sat 97% on room air.  He is a moderately obese male in no acute distress.  He is oriented x3.  Affect is alert, gait is normal.  He is able to toe walk, heel walk into a tandem gait.  His Romberg is  negative.  His cranial nerves III, IV, and VI are intact.  His cranial  nerve V has mild diminution to pinprick on the left compared to the  right side in V1, V2, V3.  He has normal neck range of motion, normal  upper extremity strength and reflexes as well as lower extremity  strength and reflexes.  He had no facial asymmetry noted.  He has no  facial swelling or discoloration or rashes.   IMPRESSION:  1. Trigeminal neuralgia involving V1, V2, V3.  His pain is in the mild      to moderate range, and his functional status is independent.  He  does have symptoms, however, that are sufficiently bothersome to      seek further treatment.  We have gone over treatment options.  The      following options were discussed with the patient and his wife:      One is trialing different types of pain medicines.  There are a      number of anticonvulsants, as well as tricyclics that can be      trialed to see if we can find appropriate balance of effective      relief and minimizing side effects.  2. We discussed other more invasive treatments such as trigeminal      nerve block, and if temporarily successful going on to trigeminal      nerve radiofrequency procedure.  Given the mild to moderate      severity of the pain, this was not felt to be something worth      pursuing at this time.  3. Acupuncture and other alternative therapies.  As I discussed with       the patient, an acupuncture trial of weekly visits, totaling at      least 6, would be needed to adequately assess efficacy of this      procedure.  I would also recommend that this be done with      electrical stimulation of the needles.  We went over the mechanism      action, including endorphin stimulation, and stimulation of the      descending inhibitory pain systems.  I also gave him my medical      acupuncture website, [email or web address] which he will also      check.  He would like to schedule visits and will have him do that      today, and initiate treatment at that time.   Thank you very much for this interesting consultation.      Erick Colace, M.D.  Electronically Signed     AEK/MedQ  D:  01/17/2007 13:46:58  T:  01/18/2007 09:45:55  Job #:  [id number]   cc:   Rosalyn Gess. Norins, MD  [address]  [place]  [place] [address]

## 2010-10-13 NOTE — Procedures (Signed)
NAME:  Adrian [name], Adrian [name] NO.:  [id number]   MEDICAL RECORD NO.:  [id number]          PATIENT TYPE:  REC   LOCATION:  [facility]                          FACILITY:  [facility]   PHYSICIAN:  Erick Colace, M.D.DATE OF BIRTH:  03-16-1951   DATE OF PROCEDURE:  07/27/2007  DATE OF DISCHARGE:                               OPERATIVE REPORT   PROCEDURE:  Acupuncture.   INDICATIONS FOR PROCEDURE:  Trigeminal neuralgia only partially  responsive to other treatments including medication management and Gamma  knife surgery.   DESCRIPTION OF PROCEDURE:  DU24.5, left GB13, left BL2, left ST2,  utilized as well as bilateral LI4 and ST36.  20 hertz inbetween SI19,  BL2, and ST2 and 4 hertz stimulation between bilateral LI4 and ST36.  The patient to return in five weeks.  The patient tolerated the  procedure well.  Treatment time 30 minutes.      Erick Colace, M.D.  Electronically Signed     AEK/MEDQ  D:  07/27/2007 08:45:19  T:  07/27/2007 16:54:56  Job:  [id number]

## 2010-10-16 NOTE — Letter (Signed)
July 28, 2006    Clinton D. Maple Hudson, MD, FCCP, FACP  [facility]-Pulmonary Dept  [address]  [place]  [place] [address]   RE:  Adrian [name], Adrian [name]  MRN:  [id number]  /  DOB:  07-13-1950   Dear Levonne Spiller,   Thank you very much for seeing Mr. 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   Mr. Werth has been followed on a regular basis for general medical care.  He has had a problem since he had a Hymenoptera sting some time ago now  being hypersensitive, with increasing problems with pruritus.  He  notices that whenever he does have an episode of pruritus and scratches,  or if he has chronic rubbing irritation-like from his belt, he will  develop erythematous, macular, papular-type rash that appears like welts  or in a long string of red lesions in the area where he scratched.   The patient has generally been healthy.  He is followed for GERD and  chondromalacia.  He does have some mild hypertension for which he takes  Micardis and he does take aspirin.  The patient's last laboratory in our  office was from April 2005 and were totally normal with a normal CBC  with a normal eosinophil count, normal chemistries, normal liver  functions, and normal serum glucose.   Because of the patient's ongoing pruritus and rash, at this point, I  felt that it would be helpful for him to have an allergy evaluation to  rule that out as a cause as opposed to straight forward idiopathic  urticaria.   If I can provide any additional information about this patient or if it  is not included in his chart, please do not hesitate to contact me.  As  always, I appreciate your assistance in the evaluation of patients' and  look forward to hearing from you.    Sincerely,      Rosalyn Gess. Norins, MD  Electronically Signed    MEN/MedQ  DD: 07/28/2006  DT: 07/28/2006  Job #: [id number]

## 2010-10-16 NOTE — Assessment & Plan Note (Signed)
[facility]                             PULMONARY OFFICE NOTE   Adrian [name], Adrian [name]                      MRN:          [id number]  DATE:09/23/2006                            DOB:          06-28-1950    PROBLEM:  Urticaria.   HISTORY:  This gentleman had originally experienced onset of chronic  urticaria after a bee sting.  At his prior visit we had put him on a  prednisone taper. He has had no more outbreaks since he stopped  steroids. He does continue maintenance fexofenadine 180 mg plus  ranitidine 150 mg for broad antihistamine coverage.  He has a feeling  that he comes close to breakouts occasionally with some minor itching,  but he has never tipped over again so far.  There has been no wheeze,  no acute process. He is not aware of fever, sweats or adenopathy.   OBJECTIVE:  Weight 236 pounds.  BP 130/82.  Pulse regular 64.  Room air  saturation 95%.  SKIN:  Is clear.  No urticaria.  He looks well.  No adenopathy, no obvious inflammation.   LABORATORY:  ANA negative.  IgE level easily within normal at 14.5.  Sedimentation rate 5.  WBC 4,200 with 11.3% monocytes (normal range is 3  to 11).  Chemistry profile normal.   IMPRESSION:  We cannot tell if his ongoing antihistamines are  suppressing urticaria which would otherwise flare, but steroids made a  difference and so far he is stable, so we are going to continue present  therapy.   Urticaria, prednisone sensitive.  The original identified trigger was an  insect sting, which has been removed.   PLAN:  Schedule return in 4 months, earlier p.r.n.     Clinton D. Maple Hudson, MD, Tonny Bollman, FACP  Electronically Signed    CDY/MedQ  DD: 09/23/2006  DT: 09/23/2006  Job #: [id number]

## 2010-10-16 NOTE — Assessment & Plan Note (Signed)
[facility]                             PULMONARY OFFICE NOTE   RICH, PAPROCKI                      MRN:          [id number]  DATE:09/02/2006                            DOB:          06-07-1950    PULMONARY OFFICE/ALLERGY CONSULTATION   PROBLEM:  Allergy consultation at the kind request of Dr. Debby Bud for  this 60 year old man concerned about urticaria and itching.   HISTORY:  He had hives around age 27 or 7 when he handled toadstools.  Otherwise, he has had no history of significant skin problems.  Six  months ago, he had an insect sting leaving a stinger and venom sac,  which he was able to easily pull out.  The insect was unidentified, but  was caught in his clothing while he was bike riding.  He had a large  local reaction, which eventually faded.  Since then, he has had  generalized skin sensitivity.  He will develop urticarial rash and  pruritus at pressure areas, around belt lines, et Karie Soda, and wherever  he scratches himself.  It is not triggered by heat or shower.  Allegra  180 mg plus ranitidine 150 mg have helped some, but he quickly develops  the same problems again if he drops those antihistamines.  He thinks he  is made worse by eating peanuts, although that does not trigger symptoms  by itself.  The key is mechanical scratching, rubbing, or pressure on  his skin.   MEDICATIONS:  1. Micardis 40 mg.  2. Aspirin 81 mg.  3. Trazodone 50 mg.  4. Fexofenadine 180 mg.  5. Ranitidine 150 mg.   No medication allergy.   REVIEW OF SYSTEMS:  Generalized itching with visible rash in the areas  of pressure or trauma as noted.  No hot or swollen joints, wheeze, or  cough, nausea or vomiting, or abdominal cramps, adenopathy, or fever.   PAST HISTORY:  Hypertension, mild seasonal allergic rhinitis, worse when  younger.  Urticaria at age 3 or 5.  Dermographism.  Trigeminal neuralgia  treated with trazodone.  Hernia and knee surgeries,  and radial  keratotomy.  No history of intolerance to latex, contrast dye, or  aspirin.   SOCIAL HISTORY:  Quit smoking 10 years ago, 1 glass of wine daily.  Married with children.  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   ENVIRONMENTAL:  He uses scent-free soaps, fabric softeners, et Karie Soda,  and is not aware of any triggering chemical exposures.  He was born in  [place], [place], and told in his teens that based on a chest x-ray image  he had had valley fever.   OBJECTIVE:  Weight 238 pounds, BP 122/68, pulse regular 56, room air  saturation 97%.  He is alert in no evident distress.  SKIN:  Clear now.  No adenopathy.  HEENT:  Conjunctivae and oropharynx clear.  No thyromegaly.  CHEST:  Quiet and clear.  HEART:  Sounds regular without murmur.  ABDOMEN:  No enlargement of liver or spleen.  EXTREMITIES:  No cyanosis, clubbing, or edema.  IMPRESSION:  Onset of urticaria after a triggering bee sting.  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  I told him that the usual expectation would be that  this would fade gradually over a period of 2 to 3 years, unless some  sustaining process was discovered.   PLAN:  1. He will continue his ranitidine and fexofenadine.  2. Prednisone 8-day taper from 40 mg for trial.  3. Blood for CBC with diff, IgE, sedimentation rate, and ANA.  4. Schedule return in 3 weeks, earlier p.r.n.  I appreciate the chance      to meet him.     Clinton D. Maple Hudson, MD, Tonny Bollman, FACP  Electronically Signed    CDY/MedQ  DD: 09/02/2006  DT: 09/03/2006  Job #: [id number]   cc:   Rosalyn Gess. Norins, MD

## 2010-10-16 NOTE — Assessment & Plan Note (Signed)
[facility]                                 ON-CALL NOTE   RYDELL, WIEGEL                           MRN:          [id number]  DATE:10/01/2006                            DOB:          08-Jan-1951    TIME OF INTERACTION:  Oct 01, 2006 at 7:33 in the morning.   PHONE NUMBER:  [phone].   OBJECTIVE:  The patient has blisters on the gluteal crease which he is  worried about possibly having MRSA.  They had been there for about 2  weeks.  It spread somewhat.  Erythematous.   ASSESSMENT:  Possible cellulitis.   PLAN:  We will see him in the office at 10:45.   PRIMARY CARE [facility] Wilhelmi:  Dr. Debby Bud.  Home office is [place].     Arta Silence, MD  Electronically Signed    RNS/MedQ  DD: 10/01/2006  DT: 10/01/2006  Job #: [id number]

## 2010-12-09 ENCOUNTER — Encounter: Payer: Self-pay | Admitting: Internal Medicine

## 2010-12-16 ENCOUNTER — Ambulatory Visit: Payer: Self-pay | Admitting: Endocrinology

## 2010-12-21 ENCOUNTER — Other Ambulatory Visit (INDEPENDENT_AMBULATORY_CARE_PROVIDER_SITE_OTHER): Payer: BC Managed Care – PPO

## 2010-12-21 DIAGNOSIS — Z0389 Encounter for observation for other suspected diseases and conditions ruled out: Secondary | ICD-10-CM

## 2010-12-21 DIAGNOSIS — Z0000 Encounter for general adult medical examination without abnormal findings: Secondary | ICD-10-CM

## 2010-12-21 LAB — HEPATIC FUNCTION PANEL
ALT: 27 U/L (ref 0–53)
AST: 18 U/L (ref 0–37)
Alkaline Phosphatase: 49 U/L (ref 39–117)
Bilirubin, Direct: 0.1 mg/dL (ref 0.0–0.3)
Total Protein: 6.8 g/dL (ref 6.0–8.3)

## 2010-12-21 LAB — BASIC METABOLIC PANEL
BUN: 17 mg/dL (ref 6–23)
CO2: 28 mEq/L (ref 19–32)
Chloride: 104 mEq/L (ref 96–112)
Glucose, Bld: 104 mg/dL — ABNORMAL HIGH (ref 70–99)
Potassium: 4.5 mEq/L (ref 3.5–5.1)

## 2010-12-21 LAB — LIPID PANEL
HDL: 38.4 mg/dL — ABNORMAL LOW (ref >39.00)
LDL Cholesterol: 72 mg/dL (ref 0–99)

## 2010-12-21 LAB — CBC WITH DIFFERENTIAL/PLATELET
Basophils Relative: 0.6 % (ref 0.0–3.0)
Eosinophils Relative: 3.9 % (ref 0.0–5.0)
Lymphocytes Relative: 46.1 % — ABNORMAL HIGH (ref 12.0–46.0)
Lymphs Abs: 2.5 10*3/uL (ref 0.7–4.0)
MCHC: 33.9 g/dL (ref 30.0–36.0)
MCV: 90.1 fl (ref 78.0–100.0)
Monocytes Absolute: 0.5 10*3/uL (ref 0.1–1.0)
Monocytes Relative: 9.8 % (ref 3.0–12.0)
Neutrophils Relative %: 39.6 % — ABNORMAL LOW (ref 43.0–77.0)
Platelets: 127 10*3/uL — ABNORMAL LOW (ref 150.0–400.0)
RDW: 13.5 % (ref 11.5–14.6)

## 2010-12-21 LAB — URINALYSIS
Ketones, ur: NEGATIVE
Leukocytes, UA: NEGATIVE
Nitrite: NEGATIVE
Total Protein, Urine: NEGATIVE
pH: 6 (ref 5.0–8.0)

## 2010-12-21 LAB — TSH: TSH: 6 u[IU]/mL — ABNORMAL HIGH (ref 0.35–5.50)

## 2010-12-24 ENCOUNTER — Ambulatory Visit (INDEPENDENT_AMBULATORY_CARE_PROVIDER_SITE_OTHER): Payer: BC Managed Care – PPO | Admitting: Internal Medicine

## 2010-12-24 ENCOUNTER — Encounter: Payer: Self-pay | Admitting: Internal Medicine

## 2010-12-24 VITALS — BP 130/84 | HR 84 | Temp 98.3°F | Resp 16 | Ht 69.75 in | Wt 218.0 lb

## 2010-12-24 DIAGNOSIS — Z0000 Encounter for general adult medical examination without abnormal findings: Secondary | ICD-10-CM | POA: Insufficient documentation

## 2010-12-24 DIAGNOSIS — E039 Hypothyroidism, unspecified: Secondary | ICD-10-CM

## 2010-12-24 MED ORDER — TRAZODONE HCL 100 MG PO TABS: 100.0000 mg | ORAL_TABLET | Freq: Every day | ORAL | Status: DC

## 2010-12-24 MED ORDER — LOSARTAN POTASSIUM 100 MG PO TABS: 100.0000 mg | ORAL_TABLET | Freq: Every day | ORAL | Status: DC

## 2010-12-24 MED ORDER — LEVOTHYROXINE SODIUM 50 MCG PO TABS: 50.0000 ug | ORAL_TABLET | Freq: Every day | ORAL | Status: DC

## 2010-12-24 NOTE — Patient Instructions (Signed)
Wt Readings from Last 3 Encounters:  12/24/10 218 lb (98.884 kg)  12/16/09 212 lb (96.163 kg)  06/23/09 214 lb (97.07 kg)

## 2010-12-24 NOTE — Progress Notes (Signed)
  Subjective:    Patient ID: Adrian [name], male    DOB: [birth year], 60 y.o.   MRN: [id number]  HPI  The patient is here for a wellness exam. The patient has been doing well overall without major physical or psychological issues going on lately. The patient needs to address  chronic hypertension that has been well controlled with medicines; to address chronic  hyperlipidemia controlled with diet  Review of Systems  Constitutional: Positive for unexpected weight change (wt gain). Negative for appetite change and fatigue.  HENT: Negative for nosebleeds, congestion, sore throat, sneezing, trouble swallowing and neck pain.   Eyes: Negative for itching and visual disturbance.  Respiratory: Negative for cough.   Cardiovascular: Negative for chest pain, palpitations and leg swelling.  Gastrointestinal: Negative for nausea, diarrhea, blood in stool and abdominal distention.  Genitourinary: Negative for frequency and hematuria.  Musculoskeletal: Negative for back pain, joint swelling and gait problem.  Skin: Negative for rash.  Neurological: Negative for dizziness, tremors, speech difficulty and weakness.  Psychiatric/Behavioral: Negative for suicidal ideas, sleep disturbance, dysphoric mood and agitation. The patient is not nervous/anxious.        Objective:   Physical Exam  Constitutional: He is oriented to person, place, and time. He appears well-developed and well-nourished. No distress.  HENT:  Head: Normocephalic and atraumatic.  Right Ear: External ear normal.  Left Ear: External ear normal.  Nose: Nose normal.  Mouth/Throat: Oropharynx is clear and moist. No oropharyngeal exudate.  Eyes: Conjunctivae and EOM are normal. Pupils are equal, round, and reactive to light. Right eye exhibits no discharge. Left eye exhibits no discharge. No scleral icterus.  Neck: Normal range of motion. Neck supple. No JVD present. No tracheal deviation present. No thyromegaly present.  Cardiovascular:  Normal rate, regular rhythm, normal heart sounds and intact distal pulses.  Exam reveals no gallop and no friction rub.   No murmur heard. Pulmonary/Chest: Effort normal and breath sounds normal. No stridor. No respiratory distress. He has no wheezes. He has no rales. He exhibits no tenderness.  Abdominal: Soft. Bowel sounds are normal. He exhibits no distension and no mass. There is no tenderness. There is no rebound and no guarding.  Genitourinary: Rectum normal, prostate normal and penis normal. Guaiac negative stool. No penile tenderness.  Musculoskeletal: Normal range of motion. He exhibits no edema and no tenderness.  Lymphadenopathy:    He has no cervical adenopathy.  Neurological: He is alert and oriented to person, place, and time. He has normal reflexes. No cranial nerve deficit. He exhibits normal muscle tone. Coordination normal.  Skin: Skin is warm and dry. No rash noted. He is not diaphoretic. No erythema. No pallor.  Psychiatric: His behavior is normal. Judgment and thought content normal.       Lab Results  Component Value Date   WBC 5.5 12/21/2010   HGB 14.1 12/21/2010   HCT 41.7 12/21/2010   PLT 127.0* 12/21/2010   CHOL 142 12/21/2010   TRIG 159.0* 12/21/2010   HDL 38.40* 12/21/2010   ALT 27 12/21/2010   AST 18 12/21/2010   NA 138 12/21/2010   K 4.5 12/21/2010   CL 104 12/21/2010   CREATININE 1.1 12/21/2010   BUN 17 12/21/2010   CO2 28 12/21/2010   TSH 6.00* 12/21/2010   PSA 0.61 12/21/2010   INR 1.0 RATIO 09/18/2007   HGBA1C 5.6 12/04/2008      Assessment & Plan:

## 2011-03-01 ENCOUNTER — Ambulatory Visit: Payer: BC Managed Care – PPO

## 2011-03-02 ENCOUNTER — Ambulatory Visit: Payer: BC Managed Care – PPO

## 2011-03-04 ENCOUNTER — Ambulatory Visit (INDEPENDENT_AMBULATORY_CARE_PROVIDER_SITE_OTHER): Payer: BC Managed Care – PPO | Admitting: *Deleted

## 2011-03-04 DIAGNOSIS — Z23 Encounter for immunization: Secondary | ICD-10-CM

## 2011-03-09 ENCOUNTER — Ambulatory Visit: Payer: BC Managed Care – PPO

## 2011-10-19 ENCOUNTER — Encounter: Payer: Self-pay | Admitting: Gastroenterology

## 2011-12-29 ENCOUNTER — Other Ambulatory Visit: Payer: Self-pay | Admitting: Internal Medicine

## 2011-12-31 ENCOUNTER — Telehealth: Payer: Self-pay | Admitting: *Deleted

## 2011-12-31 DIAGNOSIS — Z0389 Encounter for observation for other suspected diseases and conditions ruled out: Secondary | ICD-10-CM

## 2011-12-31 DIAGNOSIS — Z0000 Encounter for general adult medical examination without abnormal findings: Secondary | ICD-10-CM

## 2011-12-31 NOTE — Telephone Encounter (Signed)
Message copied by Merrilyn Puma on Fri Dec 31, 2011  2:44 PM ------      Message from: Etheleen Sia      Created: Wed Dec 29, 2011  9:20 AM      Regarding: LABS       PHYSICAL LABS FOR OCT 28 APPT

## 2011-12-31 NOTE — Telephone Encounter (Signed)
Labs entered.

## 2012-03-06 ENCOUNTER — Encounter: Payer: Self-pay | Admitting: Gastroenterology

## 2012-03-21 ENCOUNTER — Ambulatory Visit (AMBULATORY_SURGERY_CENTER): Payer: BC Managed Care – PPO | Admitting: *Deleted

## 2012-03-21 ENCOUNTER — Other Ambulatory Visit (INDEPENDENT_AMBULATORY_CARE_PROVIDER_SITE_OTHER): Payer: BC Managed Care – PPO

## 2012-03-21 ENCOUNTER — Encounter: Payer: Self-pay | Admitting: Gastroenterology

## 2012-03-21 VITALS — Ht 70.0 in | Wt 208.0 lb

## 2012-03-21 DIAGNOSIS — Z0389 Encounter for observation for other suspected diseases and conditions ruled out: Secondary | ICD-10-CM

## 2012-03-21 DIAGNOSIS — Z0000 Encounter for general adult medical examination without abnormal findings: Secondary | ICD-10-CM

## 2012-03-21 DIAGNOSIS — Z1211 Encounter for screening for malignant neoplasm of colon: Secondary | ICD-10-CM

## 2012-03-21 LAB — BASIC METABOLIC PANEL
CO2: 30 mEq/L (ref 19–32)
Chloride: 105 mEq/L (ref 96–112)
Potassium: 4.2 mEq/L (ref 3.5–5.1)
Sodium: 139 mEq/L (ref 135–145)

## 2012-03-21 LAB — HEPATIC FUNCTION PANEL
ALT: 25 U/L (ref 0–53)
AST: 18 U/L (ref 0–37)
Alkaline Phosphatase: 43 U/L (ref 39–117)
Bilirubin, Direct: 0.1 mg/dL (ref 0.0–0.3)
Total Bilirubin: 0.7 mg/dL (ref 0.3–1.2)
Total Protein: 6.4 g/dL (ref 6.0–8.3)

## 2012-03-21 LAB — URINALYSIS, ROUTINE W REFLEX MICROSCOPIC
Ketones, ur: NEGATIVE
Leukocytes, UA: NEGATIVE
Specific Gravity, Urine: 1.015 (ref 1.000–1.030)
Total Protein, Urine: NEGATIVE
pH: 6.5 (ref 5.0–8.0)

## 2012-03-21 LAB — CBC WITH DIFFERENTIAL/PLATELET
Basophils Relative: 0.5 % (ref 0.0–3.0)
Eosinophils Absolute: 0.2 10*3/uL (ref 0.0–0.7)
HCT: 41.9 % (ref 39.0–52.0)
Lymphs Abs: 2 10*3/uL (ref 0.7–4.0)
MCHC: 33.4 g/dL (ref 30.0–36.0)
MCV: 91.3 fl (ref 78.0–100.0)
Monocytes Absolute: 0.5 10*3/uL (ref 0.1–1.0)
Neutro Abs: 1.7 10*3/uL (ref 1.4–7.7)
Neutrophils Relative %: 38.1 % — ABNORMAL LOW (ref 43.0–77.0)
RBC: 4.59 Mil/uL (ref 4.22–5.81)

## 2012-03-21 LAB — LIPID PANEL
Total CHOL/HDL Ratio: 4
Triglycerides: 74 mg/dL (ref 0.0–149.0)

## 2012-03-21 MED ORDER — MOVIPREP 100 G PO SOLR: ORAL | Status: DC

## 2012-03-23 LAB — PSA: PSA: 0.58 ng/mL (ref 0.10–4.00)

## 2012-03-27 ENCOUNTER — Ambulatory Visit (INDEPENDENT_AMBULATORY_CARE_PROVIDER_SITE_OTHER): Payer: BC Managed Care – PPO | Admitting: Internal Medicine

## 2012-03-27 ENCOUNTER — Encounter: Payer: Self-pay | Admitting: Internal Medicine

## 2012-03-27 VITALS — BP 132/84 | HR 80 | Temp 98.5°F | Resp 16 | Ht 70.0 in | Wt 209.0 lb

## 2012-03-27 DIAGNOSIS — E785 Hyperlipidemia, unspecified: Secondary | ICD-10-CM

## 2012-03-27 DIAGNOSIS — Z0000 Encounter for general adult medical examination without abnormal findings: Secondary | ICD-10-CM

## 2012-03-27 DIAGNOSIS — I10 Essential (primary) hypertension: Secondary | ICD-10-CM

## 2012-03-27 DIAGNOSIS — Z23 Encounter for immunization: Secondary | ICD-10-CM

## 2012-03-27 DIAGNOSIS — R799 Abnormal finding of blood chemistry, unspecified: Secondary | ICD-10-CM

## 2012-03-27 DIAGNOSIS — E039 Hypothyroidism, unspecified: Secondary | ICD-10-CM

## 2012-03-27 DIAGNOSIS — E538 Deficiency of other specified B group vitamins: Secondary | ICD-10-CM

## 2012-03-27 MED ORDER — LEVOTHYROXINE SODIUM 50 MCG PO TABS: 50.0000 ug | ORAL_TABLET | Freq: Every day | ORAL | Status: DC

## 2012-03-27 MED ORDER — TRAZODONE HCL 100 MG PO TABS: 50.0000 mg | ORAL_TABLET | Freq: Every day | ORAL | Status: DC

## 2012-03-27 MED ORDER — ASPIRIN 81 MG PO TABS: 81.0000 mg | ORAL_TABLET | Freq: Every day | ORAL | Status: DC

## 2012-03-27 MED ORDER — LOSARTAN POTASSIUM 100 MG PO TABS: 50.0000 mg | ORAL_TABLET | Freq: Every day | ORAL | Status: DC

## 2012-03-27 NOTE — Assessment & Plan Note (Signed)
We discussed age appropriate health related issues, including available/recomended screening tests and vaccinations. We discussed a need for adhering to healthy diet and exercise. Labs/EKG were reviewed/ordered. All questions were answered.   

## 2012-03-27 NOTE — Assessment & Plan Note (Signed)
Continue with current prescription therapy as reflected on the Med list.  

## 2012-03-27 NOTE — Assessment & Plan Note (Signed)
On MVI 

## 2012-03-27 NOTE — Progress Notes (Signed)
Subjective:    Patient ID: Adrian [name], male    DOB: 04-13-1951, 61 y.o.   MRN: [id number]  HPI  The patient is here for a wellness exam. The patient has been doing well overall without major physical or psychological issues going on lately.  The patient needs to address  chronic hypertension that has been well controlled with medicines; to address chronic abn CBC  BP Readings from Last 3 Encounters:  03/27/12 132/84  12/24/10 130/84  12/16/09 114/82   Wt Readings from Last 3 Encounters:  03/27/12 209 lb (94.802 kg)  03/21/12 208 lb (94.348 kg)  12/24/10 218 lb (98.884 kg)      Review of Systems  Constitutional: Positive for unexpected weight change (wt gain). Negative for appetite change and fatigue.  HENT: Negative for nosebleeds, congestion, sore throat, sneezing, trouble swallowing and neck pain.   Eyes: Negative for itching and visual disturbance.  Respiratory: Negative for cough.   Cardiovascular: Negative for chest pain, palpitations and leg swelling.  Gastrointestinal: Negative for nausea, diarrhea, blood in stool and abdominal distention.  Genitourinary: Negative for frequency and hematuria.  Musculoskeletal: Negative for back pain, joint swelling and gait problem.  Skin: Negative for rash.  Neurological: Negative for dizziness, tremors, speech difficulty and weakness.  Psychiatric/Behavioral: Negative for suicidal ideas, disturbed wake/sleep cycle, dysphoric mood and agitation. The patient is not nervous/anxious.        Objective:   Physical Exam  Constitutional: He is oriented to person, place, and time. He appears well-developed and well-nourished. No distress.  HENT:  Head: Normocephalic and atraumatic.  Right Ear: External ear normal.  Left Ear: External ear normal.  Nose: Nose normal.  Mouth/Throat: Oropharynx is clear and moist. No oropharyngeal exudate.  Eyes: Conjunctivae normal and EOM are normal. Pupils are equal, round, and reactive to light.  Right eye exhibits no discharge. Left eye exhibits no discharge. No scleral icterus.  Neck: Normal range of motion. Neck supple. No JVD present. No tracheal deviation present. No thyromegaly present.  Cardiovascular: Normal rate, regular rhythm, normal heart sounds and intact distal pulses.  Exam reveals no gallop and no friction rub.   No murmur heard. Pulmonary/Chest: Effort normal and breath sounds normal. No stridor. No respiratory distress. He has no wheezes. He has no rales. He exhibits no tenderness.  Abdominal: Soft. Bowel sounds are normal. He exhibits no distension and no mass. There is no tenderness. There is no rebound and no guarding.  Genitourinary: Rectum normal, prostate normal and penis normal. Guaiac negative stool. No penile tenderness.  Musculoskeletal: Normal range of motion. He exhibits no edema and no tenderness.  Lymphadenopathy:    He has no cervical adenopathy.  Neurological: He is alert and oriented to person, place, and time. He has normal reflexes. No cranial nerve deficit. He exhibits normal muscle tone. Coordination normal.  Skin: Skin is warm and dry. No rash noted. He is not diaphoretic. No erythema. No pallor.  Psychiatric: His behavior is normal. Judgment and thought content normal.       Lab Results  Component Value Date   WBC 4.4* 03/21/2012   HGB 14.0 03/21/2012   HCT 41.9 03/21/2012   PLT 145.0* 03/21/2012   CHOL 149 03/21/2012   TRIG 74.0 03/21/2012   HDL 39.10 03/21/2012   ALT 25 03/21/2012   AST 18 03/21/2012   NA 139 03/21/2012   K 4.2 03/21/2012   CL 105 03/21/2012   CREATININE 1.1 03/21/2012   BUN 21 03/21/2012  CO2 30 03/21/2012   TSH 3.48 03/21/2012   PSA 0.58 03/21/2012   INR 1.0 RATIO 09/18/2007   HGBA1C 5.6 12/04/2008      Assessment & Plan:

## 2012-03-27 NOTE — Assessment & Plan Note (Signed)
resolved 

## 2012-03-27 NOTE — Assessment & Plan Note (Signed)
Chronic - stopping ASA did not correct abn CBC

## 2012-04-04 ENCOUNTER — Encounter: Payer: Self-pay | Admitting: Gastroenterology

## 2012-04-04 ENCOUNTER — Ambulatory Visit (AMBULATORY_SURGERY_CENTER): Payer: BC Managed Care – PPO | Admitting: Gastroenterology

## 2012-04-04 VITALS — BP 121/75 | HR 54 | Temp 97.8°F | Resp 21 | Ht 70.0 in | Wt 208.0 lb

## 2012-04-04 DIAGNOSIS — D126 Benign neoplasm of colon, unspecified: Secondary | ICD-10-CM

## 2012-04-04 DIAGNOSIS — Z1211 Encounter for screening for malignant neoplasm of colon: Secondary | ICD-10-CM

## 2012-04-04 MED ORDER — SODIUM CHLORIDE 0.9 % IV SOLN: 500.0000 mL | INTRAVENOUS | Status: DC

## 2012-04-04 NOTE — Patient Instructions (Addendum)
YOU HAD AN ENDOSCOPIC PROCEDURE TODAY AT THE [facility]: Refer to the procedure report that was given to you for any specific questions about what was found during the examination.  If the procedure report does not answer your questions, please call your gastroenterologist to clarify.  If you requested that your care partner not be given the details of your procedure findings, then the procedure report has been included in a sealed envelope for you to review at your convenience later.  YOU SHOULD EXPECT: Some feelings of bloating in the abdomen. Passage of more gas than usual.  Walking can help get rid of the air that was put into your GI tract during the procedure and reduce the bloating. If you had a lower endoscopy (such as a colonoscopy or flexible sigmoidoscopy) you may notice spotting of blood in your stool or on the toilet paper. If you underwent a bowel prep for your procedure, then you may not have a normal bowel movement for a few days.  DIET: Your first meal following the procedure should be a light meal and then it is ok to progress to your normal diet.  A half-sandwich or bowl of soup is an example of a good first meal.  Heavy or fried foods are harder to digest and may make you feel nauseous or bloated.  Likewise meals heavy in dairy and vegetables can cause extra gas to form and this can also increase the bloating.  Drink plenty of fluids but you should avoid alcoholic beverages for 24 hours.  ACTIVITY: Your care partner should take you home directly after the procedure.  You should plan to take it easy, moving slowly for the rest of the day.  You can resume normal activity the day after the procedure however you should NOT DRIVE or use heavy machinery for 24 hours (because of the sedation medicines used during the test).    SYMPTOMS TO REPORT IMMEDIATELY: A gastroenterologist can be reached at any hour.  During normal business hours, 8:30 AM to 5:00 PM Monday through Friday,  call [phone].  After hours and on weekends, please call the GI answering service at [phone] who will take a message and have the physician on call contact you.   Following lower endoscopy (colonoscopy or flexible sigmoidoscopy):  Excessive amounts of blood in the stool  Significant tenderness or worsening of abdominal pains  Swelling of the abdomen that is new, acute  Fever of 100F or higher  Following upper endoscopy (EGD)  Vomiting of blood or coffee ground material  New chest pain or pain under the shoulder blades  Painful or persistently difficult swallowing  New shortness of breath  Fever of 100F or higher  Black, tarry-looking stools  FOLLOW UP: If any biopsies were taken you will be contacted by phone or by letter within the next 1-3 weeks.  Call your gastroenterologist if you have not heard about the biopsies in 3 weeks.  Our staff will call the home number listed on your records the next business day following your procedure to check on you and address any questions or concerns that you may have at that time regarding the information given to you following your procedure. This is a courtesy call and so if there is no answer at the home number and we have not heard from you through the emergency physician on call, we will assume that you have returned to your regular daily activities without incident.  SIGNATURES/CONFIDENTIALITY: You and/or your care   partner have signed paperwork which will be entered into your electronic medical record.  These signatures attest to the fact that that the information above on your After Visit Summary has been reviewed and is understood.  Full responsibility of the confidentiality of this discharge information lies with you and/or your care-partner.  

## 2012-04-04 NOTE — Progress Notes (Signed)
Patient did not experience any of the following events: a burn prior to discharge; a fall within the facility; wrong site/side/patient/procedure/implant event; or a hospital transfer or hospital admission upon discharge from the facility. (G8907) Patient did not have preoperative order for IV antibiotic SSI prophylaxis. (G8918)  

## 2012-04-04 NOTE — Progress Notes (Signed)
NAC noted-VSS

## 2012-04-04 NOTE — Op Note (Signed)
[facility] [address] [place], [address]   COLONOSCOPY PROCEDURE REPORT PATIENT: Adrian [name], Adrian [name]  MR#: [id number] BIRTHDATE: 1950-12-11 , 61  yrs. old GENDER: Male ENDOSCOPIST: Meryl Dare, MD, [facility] PROCEDURE DATE:  04/04/2012 PROCEDURE:   Colonoscopy with snare polypectomy ASA CLASS:   Class II INDICATIONS:average risk screening. MEDICATIONS: MAC sedation, administered by CRNA and propofol (Diprivan) 300mg  IV DESCRIPTION OF PROCEDURE:   After the risks benefits and alternatives of the procedure were thoroughly explained, informed consent was obtained.  A digital rectal exam revealed no abnormalities of the rectum.   The LB [id number]  endoscope was introduced through the anus and advanced to the cecum, which was identified by both the appendix and ileocecal valve. No adverse events experienced.   The quality of the prep was good, using MoviPrep  The instrument was then slowly withdrawn as the colon was fully examined.  COLON FINDINGS: A sessile polyp measuring 1.5 cm in size was found in the ascending colon.  A piecemeal polypectomy was performed using snare cautery.  The resection was complete and the polyp tissue was completely retrieved.  A sessile polyp measuring 1 cm in size was found in the transverse colon.  A polypectomy was performed using snare cautery.  The resection was complete and the polyp tissue was completely retrieved.   Moderate diverticulosis was noted in the sigmoid colon and descending colon.   The colon was otherwise normal.  There was no diverticulosis, inflammation, polyps or cancers unless previously stated.  Retroflexed views revealed small internal hemorrhoids. The time to cecum=3 minutes 29 seconds.  Withdrawal time=13 minutes 18 seconds.  The scope was withdrawn and the procedure completed. COMPLICATIONS: There were no complications.  ENDOSCOPIC IMPRESSION: 1.   Sessile polyp in the ascending colon; piecemeal  polypectomy performed using snare cauter; 2.   Sessile polyp in the transverse colon; polypectomy performed using snare cautery 3.   Moderate diverticulosis was noted in the sigmoid colon and descending colon 4.   Small internal hemorrhoids  RECOMMENDATIONS: 1.  Hold aspirin, aspirin products, and anti-inflammatory medication for 2 weeks 2.  Await biopsy results 3.  Repeat Colonoscopy in 1 year to assess ascending colon polypectomy site 4.  High fiber diet with liberal fluid intake  eSigned:  Meryl Dare, MD, [facility] 04/04/2012 3:42 PM

## 2012-04-05 ENCOUNTER — Telehealth: Payer: Self-pay | Admitting: *Deleted

## 2012-04-05 NOTE — Telephone Encounter (Signed)
  Follow up Call-  Call back number 04/04/2012  Post procedure Call Back phone  # [phone]  Permission to leave phone message Yes     Patient questions:  Do you have a fever, pain , or abdominal swelling? no Pain Score  0 *  Have you tolerated food without any problems? yes  Have you been able to return to your normal activities? yes  Do you have any questions about your discharge instructions: Diet   no Medications  no Follow up visit  no  Do you have questions or concerns about your Care? no  Actions: * If pain score is 4 or above: No action needed, pain <4.

## 2012-04-10 ENCOUNTER — Encounter: Payer: Self-pay | Admitting: Gastroenterology

## 2013-01-13 ENCOUNTER — Encounter: Payer: Self-pay | Admitting: Gastroenterology

## 2013-02-06 ENCOUNTER — Encounter: Payer: Self-pay | Admitting: Gastroenterology

## 2013-02-28 ENCOUNTER — Encounter: Payer: Self-pay | Admitting: Gastroenterology

## 2013-03-01 ENCOUNTER — Ambulatory Visit (INDEPENDENT_AMBULATORY_CARE_PROVIDER_SITE_OTHER): Payer: BC Managed Care – PPO | Admitting: *Deleted

## 2013-03-01 DIAGNOSIS — Z23 Encounter for immunization: Secondary | ICD-10-CM

## 2013-03-06 ENCOUNTER — Other Ambulatory Visit: Payer: Self-pay | Admitting: Internal Medicine

## 2013-03-06 DIAGNOSIS — Z0000 Encounter for general adult medical examination without abnormal findings: Secondary | ICD-10-CM

## 2013-03-06 DIAGNOSIS — E538 Deficiency of other specified B group vitamins: Secondary | ICD-10-CM

## 2013-03-06 DIAGNOSIS — Z7721 Contact with and (suspected) exposure to potentially hazardous body fluids: Secondary | ICD-10-CM

## 2013-03-06 DIAGNOSIS — N320 Bladder-neck obstruction: Secondary | ICD-10-CM

## 2013-03-21 ENCOUNTER — Ambulatory Visit: Payer: BC Managed Care – PPO

## 2013-03-22 ENCOUNTER — Ambulatory Visit (INDEPENDENT_AMBULATORY_CARE_PROVIDER_SITE_OTHER): Payer: BC Managed Care – PPO | Admitting: *Deleted

## 2013-03-22 DIAGNOSIS — Z23 Encounter for immunization: Secondary | ICD-10-CM

## 2013-03-23 ENCOUNTER — Other Ambulatory Visit (INDEPENDENT_AMBULATORY_CARE_PROVIDER_SITE_OTHER): Payer: BC Managed Care – PPO

## 2013-03-23 DIAGNOSIS — Z0000 Encounter for general adult medical examination without abnormal findings: Secondary | ICD-10-CM

## 2013-03-23 DIAGNOSIS — N320 Bladder-neck obstruction: Secondary | ICD-10-CM

## 2013-03-23 DIAGNOSIS — E538 Deficiency of other specified B group vitamins: Secondary | ICD-10-CM

## 2013-03-23 DIAGNOSIS — Z7721 Contact with and (suspected) exposure to potentially hazardous body fluids: Secondary | ICD-10-CM

## 2013-03-23 LAB — URINALYSIS
Bilirubin Urine: NEGATIVE
Hgb urine dipstick: NEGATIVE
Ketones, ur: NEGATIVE
Leukocytes, UA: NEGATIVE
Nitrite: NEGATIVE
Urobilinogen, UA: 0.2 (ref 0.0–1.0)
pH: 6 (ref 5.0–8.0)

## 2013-03-23 LAB — LIPID PANEL
HDL: 33.2 mg/dL — ABNORMAL LOW (ref >39.00)
LDL Cholesterol: 86 mg/dL (ref 0–99)
Total CHOL/HDL Ratio: 5
Triglycerides: 156 mg/dL — ABNORMAL HIGH (ref 0.0–149.0)

## 2013-03-23 LAB — BASIC METABOLIC PANEL
CO2: 27 mEq/L (ref 19–32)
Calcium: 9.3 mg/dL (ref 8.4–10.5)
Chloride: 103 mEq/L (ref 96–112)
Creatinine, Ser: 1.1 mg/dL (ref 0.4–1.5)
Glucose, Bld: 89 mg/dL (ref 70–99)
Sodium: 139 mEq/L (ref 135–145)

## 2013-03-23 LAB — CBC WITH DIFFERENTIAL/PLATELET
Basophils Absolute: 0 10*3/uL (ref 0.0–0.1)
Basophils Relative: 0.5 % (ref 0.0–3.0)
Eosinophils Absolute: 0.2 10*3/uL (ref 0.0–0.7)
Hemoglobin: 14.4 g/dL (ref 13.0–17.0)
Lymphocytes Relative: 42.3 % (ref 12.0–46.0)
Lymphs Abs: 2 10*3/uL (ref 0.7–4.0)
MCHC: 33.7 g/dL (ref 30.0–36.0)
MCV: 88.9 fl (ref 78.0–100.0)
Monocytes Absolute: 0.5 10*3/uL (ref 0.1–1.0)
Neutro Abs: 1.9 10*3/uL (ref 1.4–7.7)
Neutrophils Relative %: 41.2 % — ABNORMAL LOW (ref 43.0–77.0)
RBC: 4.8 Mil/uL (ref 4.22–5.81)
RDW: 13.7 % (ref 11.5–14.6)

## 2013-03-23 LAB — HEPATIC FUNCTION PANEL
ALT: 31 U/L (ref 0–53)
Albumin: 4 g/dL (ref 3.5–5.2)
Bilirubin, Direct: 0.1 mg/dL (ref 0.0–0.3)
Total Protein: 6.5 g/dL (ref 6.0–8.3)

## 2013-03-23 LAB — VITAMIN B12: Vitamin B-12: 650 pg/mL (ref 211–911)

## 2013-03-27 ENCOUNTER — Ambulatory Visit (AMBULATORY_SURGERY_CENTER): Payer: Self-pay | Admitting: *Deleted

## 2013-03-27 ENCOUNTER — Encounter: Payer: Self-pay | Admitting: Gastroenterology

## 2013-03-27 VITALS — Ht 70.0 in | Wt 222.4 lb

## 2013-03-27 DIAGNOSIS — Z86010 Personal history of colonic polyps: Secondary | ICD-10-CM

## 2013-03-27 MED ORDER — MOVIPREP 100 G PO SOLR: 1.0000 | Freq: Once | ORAL | Status: DC

## 2013-03-27 NOTE — Progress Notes (Signed)
Denies allergies to eggs or soy products. Denies complications with sedation or anesthesia. 

## 2013-03-28 ENCOUNTER — Ambulatory Visit (INDEPENDENT_AMBULATORY_CARE_PROVIDER_SITE_OTHER): Payer: BC Managed Care – PPO | Admitting: Internal Medicine

## 2013-03-28 ENCOUNTER — Encounter: Payer: Self-pay | Admitting: Internal Medicine

## 2013-03-28 VITALS — BP 122/86 | HR 57 | Temp 97.8°F | Ht 70.0 in | Wt 220.5 lb

## 2013-03-28 DIAGNOSIS — D126 Benign neoplasm of colon, unspecified: Secondary | ICD-10-CM

## 2013-03-28 DIAGNOSIS — Z0000 Encounter for general adult medical examination without abnormal findings: Secondary | ICD-10-CM

## 2013-03-28 DIAGNOSIS — E039 Hypothyroidism, unspecified: Secondary | ICD-10-CM

## 2013-03-28 DIAGNOSIS — K635 Polyp of colon: Secondary | ICD-10-CM | POA: Insufficient documentation

## 2013-03-28 DIAGNOSIS — I10 Essential (primary) hypertension: Secondary | ICD-10-CM

## 2013-03-28 DIAGNOSIS — E538 Deficiency of other specified B group vitamins: Secondary | ICD-10-CM

## 2013-03-28 DIAGNOSIS — R799 Abnormal finding of blood chemistry, unspecified: Secondary | ICD-10-CM

## 2013-03-28 MED ORDER — HALOBETASOL PROPIONATE 0.05 % EX CREA: 1.0000 "application " | TOPICAL_CREAM | Freq: Two times a day (BID) | CUTANEOUS | Status: DC | PRN

## 2013-03-28 MED ORDER — TRAZODONE HCL 100 MG PO TABS: 50.0000 mg | ORAL_TABLET | Freq: Every day | ORAL | Status: DC

## 2013-03-28 MED ORDER — GENTAMICIN SULFATE 0.1 % EX OINT: TOPICAL_OINTMENT | Freq: Three times a day (TID) | CUTANEOUS | Status: DC

## 2013-03-28 MED ORDER — LOSARTAN POTASSIUM 100 MG PO TABS: 50.0000 mg | ORAL_TABLET | Freq: Every day | ORAL | Status: DC

## 2013-03-28 MED ORDER — LEVOTHYROXINE SODIUM 50 MCG PO TABS: 50.0000 ug | ORAL_TABLET | Freq: Every day | ORAL | Status: DC

## 2013-03-28 NOTE — Assessment & Plan Note (Signed)
Last test was ok

## 2013-03-28 NOTE — Assessment & Plan Note (Signed)
We discussed age appropriate health related issues, including available/recomended screening tests and vaccinations. We discussed a need for adhering to healthy diet and exercise. Labs/EKG were reviewed/ordered. All questions were answered.   

## 2013-03-28 NOTE — Assessment & Plan Note (Signed)
Continue with current prescription therapy as reflected on the Med list.  

## 2013-03-28 NOTE — Progress Notes (Signed)
Subjective:    HPI  The patient is here for a wellness exam. The patient has been doing well overall without major physical or psychological issues going on lately.  The patient needs to address  chronic hypertension that has been well controlled with medicines; to address chronic abn CBC  BP Readings from Last 3 Encounters:  03/28/13 122/86  04/04/12 121/75  03/27/12 132/84   Wt Readings from Last 3 Encounters:  03/28/13 220 lb 8 oz (100.018 kg)  03/27/13 222 lb 6.4 oz (100.88 kg)  04/04/12 208 lb (94.348 kg)      Review of Systems  Constitutional: Positive for unexpected weight change (wt gain). Negative for appetite change and fatigue.  HENT: Negative for congestion, nosebleeds, sneezing, sore throat and trouble swallowing.   Eyes: Negative for itching and visual disturbance.  Respiratory: Negative for cough.   Cardiovascular: Negative for chest pain, palpitations and leg swelling.  Gastrointestinal: Negative for nausea, diarrhea, blood in stool and abdominal distention.  Genitourinary: Negative for frequency and hematuria.  Musculoskeletal: Negative for back pain, gait problem, joint swelling and neck pain.  Skin: Negative for rash.  Neurological: Negative for dizziness, tremors, speech difficulty and weakness.  Psychiatric/Behavioral: Negative for suicidal ideas, sleep disturbance, dysphoric mood and agitation. The patient is not nervous/anxious.        Objective:   Physical Exam  Constitutional: He is oriented to person, place, and time. He appears well-developed and well-nourished. No distress.  HENT:  Head: Normocephalic and atraumatic.  Right Ear: External ear normal.  Left Ear: External ear normal.  Nose: Nose normal.  Mouth/Throat: Oropharynx is clear and moist. No oropharyngeal exudate.  Eyes: Conjunctivae and EOM are normal. Pupils are equal, round, and reactive to light. Right eye exhibits no discharge. Left eye exhibits no discharge. No scleral  icterus.  Neck: Normal range of motion. Neck supple. No JVD present. No tracheal deviation present. No thyromegaly present.  Cardiovascular: Normal rate, regular rhythm, normal heart sounds and intact distal pulses.  Exam reveals no gallop and no friction rub.   No murmur heard. Pulmonary/Chest: Effort normal and breath sounds normal. No stridor. No respiratory distress. He has no wheezes. He has no rales. He exhibits no tenderness.  Abdominal: Soft. Bowel sounds are normal. He exhibits no distension and no mass. There is no tenderness. There is no rebound and no guarding.  Genitourinary: Penis normal. Guaiac negative stool. No penile tenderness.  Per GI  Musculoskeletal: Normal range of motion. He exhibits no edema and no tenderness.  Lymphadenopathy:    He has no cervical adenopathy.  Neurological: He is alert and oriented to person, place, and time. He has normal reflexes. No cranial nerve deficit. He exhibits normal muscle tone. Coordination normal.  Skin: Skin is warm and dry. No rash noted. He is not diaphoretic. No erythema. No pallor.  Psychiatric: His behavior is normal. Judgment and thought content normal.       Lab Results  Component Value Date   WBC 4.6 03/23/2013   HGB 14.4 03/23/2013   HCT 42.7 03/23/2013   PLT 141.0* 03/23/2013   CHOL 150 03/23/2013   TRIG 156.0* 03/23/2013   HDL 33.20* 03/23/2013   ALT 31 03/23/2013   AST 20 03/23/2013   NA 139 03/23/2013   K 4.3 03/23/2013   CL 103 03/23/2013   CREATININE 1.1 03/23/2013   BUN 23 03/23/2013   CO2 27 03/23/2013   TSH 3.48 03/21/2012   PSA 0.63 03/23/2013   INR 1.0  RATIO 09/18/2007   HGBA1C 5.6 12/04/2008      Assessment & Plan:

## 2013-03-28 NOTE — Assessment & Plan Note (Signed)
Colonoscopy in 2014

## 2013-04-10 ENCOUNTER — Encounter: Payer: BC Managed Care – PPO | Admitting: Gastroenterology

## 2013-04-20 ENCOUNTER — Ambulatory Visit (INDEPENDENT_AMBULATORY_CARE_PROVIDER_SITE_OTHER): Payer: BC Managed Care – PPO

## 2013-04-20 DIAGNOSIS — Z23 Encounter for immunization: Secondary | ICD-10-CM

## 2013-05-01 ENCOUNTER — Encounter: Payer: BC Managed Care – PPO | Admitting: Gastroenterology

## 2013-05-03 ENCOUNTER — Ambulatory Visit (AMBULATORY_SURGERY_CENTER): Payer: BC Managed Care – PPO | Admitting: Gastroenterology

## 2013-05-03 ENCOUNTER — Encounter: Payer: Self-pay | Admitting: Gastroenterology

## 2013-05-03 VITALS — BP 129/81 | HR 54 | Temp 97.7°F | Resp 32 | Ht 70.0 in | Wt 222.0 lb

## 2013-05-03 DIAGNOSIS — D126 Benign neoplasm of colon, unspecified: Secondary | ICD-10-CM

## 2013-05-03 DIAGNOSIS — Z86010 Personal history of colonic polyps: Secondary | ICD-10-CM

## 2013-05-03 MED ORDER — SODIUM CHLORIDE 0.9 % IV SOLN: 500.0000 mL | INTRAVENOUS | Status: DC

## 2013-05-03 NOTE — Patient Instructions (Signed)

## 2013-05-03 NOTE — Op Note (Signed)
[facility] [address] [place], [address]   COLONOSCOPY PROCEDURE REPORT  PATIENT: Adrian, [name]  MR#: [id number] BIRTHDATE: 03/29/1951 , 62  yrs. old GENDER: Male ENDOSCOPIST: Meryl Dare, MD, [facility] PROCEDURE DATE:  05/03/2013 PROCEDURE:   Colonoscopy with snare polypectomy First Screening Colonoscopy - Avg.  risk and is 50 yrs.  old or older - No.  Prior Negative Screening - Now for repeat screening. N/A  History of Adenoma - Now for follow-up colonoscopy & has been > or = to 3 yrs.  No.  It has been less than 3 yrs since last colonoscopy.  Medical reason.  Polyps Removed Today? Yes. ASA CLASS:   Class II INDICATIONS:Patient's personal history of adenomatous colon polyps and piecemeal polypectomy in 2013. MEDICATIONS: MAC sedation, administered by CRNA and propofol (Diprivan) 300mg  IV DESCRIPTION OF PROCEDURE:   After the risks benefits and alternatives of the procedure were thoroughly explained, informed consent was obtained.  A digital rectal exam revealed no abnormalities of the rectum.   The LB [id number]  endoscope was introduced through the anus and advanced to the cecum, which was identified by both the appendix and ileocecal valve. No adverse events experienced.   The quality of the prep was good, using MoviPrep  The instrument was then slowly withdrawn as the colon was fully examined.  COLON FINDINGS: Two sessile polyps measuring 5-9 mm in size were found in the transverse colon.  A polypectomy was performed with a cold snare and using snare cautery. There was no residual polyp noted in the ascending colon. Moderate diverticulosis was noted in the descending and sigmoid colon.  The colon was otherwise normal. There was no diverticulosis, inflammation, polyps or cancers unless previously stated.  Retroflexed views revealed internal hemorrhoids. The time to cecum=2 minutes 24 seconds.  Withdrawal time=11 minutes 37 seconds.  The scope was  withdrawn and the procedure completed. COMPLICATIONS: There were no complications.  ENDOSCOPIC IMPRESSION: 1.   Two sessile polyps, 5-9 mm, in the transverse colon; polypectomy performed with a cold snare and using snare cautery 2.   Moderate diverticulosis in the descending and sigmoid colon 3.   Internal hemorrhoids  RECOMMENDATIONS: 1.  Hold aspirin, aspirin products, and anti-inflammatory medication for 2 weeks. 2.  Await pathology results 3.  High fiber diet with liberal fluid intake. 4.  Repeat Colonoscopy in 3 years.  eSigned:  Meryl Dare, MD, [facility] 05/03/2013 2:22 PM

## 2013-05-03 NOTE — Progress Notes (Signed)
Called to room to assist during endoscopic procedure.  Patient ID and intended procedure confirmed with present staff. Received instructions for my participation in the procedure from the performing physician.  

## 2013-05-03 NOTE — Progress Notes (Signed)
Report to pacu rn, vss, bbs=clear 

## 2013-05-03 NOTE — Progress Notes (Signed)
No complaints noted in the recovery room. Maw   

## 2013-05-03 NOTE — Progress Notes (Signed)
No egg or soy allergy. ewm 

## 2013-05-04 ENCOUNTER — Telehealth: Payer: Self-pay

## 2013-05-04 NOTE — Telephone Encounter (Signed)
No answer, left message

## 2013-05-08 ENCOUNTER — Encounter: Payer: Self-pay | Admitting: Gastroenterology

## 2013-09-21 ENCOUNTER — Ambulatory Visit: Payer: BC Managed Care – PPO

## 2013-09-22 ENCOUNTER — Encounter: Payer: Self-pay | Admitting: Family Medicine

## 2013-09-22 ENCOUNTER — Ambulatory Visit (INDEPENDENT_AMBULATORY_CARE_PROVIDER_SITE_OTHER): Payer: BC Managed Care – PPO | Admitting: Family Medicine

## 2013-09-22 VITALS — BP 158/88 | HR 55 | Temp 97.2°F | Wt 230.0 lb

## 2013-09-22 DIAGNOSIS — J019 Acute sinusitis, unspecified: Secondary | ICD-10-CM

## 2013-09-22 MED ORDER — AMOXICILLIN-POT CLAVULANATE 875-125 MG PO TABS: 1.0000 | ORAL_TABLET | Freq: Two times a day (BID) | ORAL | Status: DC

## 2013-09-22 NOTE — Progress Notes (Signed)
   Subjective:    Patient ID: Adrian [name], male    DOB: 1951/03/16, 63 y.o.   MRN: [id number]  HPI Here for one week of sinus pressure, HA, ST, chest tightness and a dry cough.   Review of Systems  Constitutional: Negative.   HENT: Positive for congestion and sinus pressure.   Eyes: Negative.   Respiratory: Positive for cough and chest tightness.        Objective:   Physical Exam  Constitutional: He appears well-developed and well-nourished.  HENT:  Right Ear: External ear normal.  Left Ear: External ear normal.  Nose: Nose normal.  Mouth/Throat: Oropharynx is clear and moist.  Eyes: Conjunctivae are normal.  Pulmonary/Chest: Effort normal and breath sounds normal.  Lymphadenopathy:    He has no cervical adenopathy.          Assessment & Plan:  Add Mucinex

## 2013-09-24 ENCOUNTER — Ambulatory Visit: Payer: BC Managed Care – PPO

## 2013-09-24 ENCOUNTER — Ambulatory Visit: Payer: BC Managed Care – PPO | Admitting: Internal Medicine

## 2013-10-02 ENCOUNTER — Encounter: Payer: Self-pay | Admitting: Internal Medicine

## 2013-10-02 ENCOUNTER — Ambulatory Visit (INDEPENDENT_AMBULATORY_CARE_PROVIDER_SITE_OTHER)
Admission: RE | Admit: 2013-10-02 | Discharge: 2013-10-02 | Disposition: A | Discharge status: Home / Self Care | Payer: BC Managed Care – PPO | Source: Ambulatory Visit | Attending: Internal Medicine | Admitting: Internal Medicine

## 2013-10-02 ENCOUNTER — Ambulatory Visit (INDEPENDENT_AMBULATORY_CARE_PROVIDER_SITE_OTHER): Payer: BC Managed Care – PPO | Admitting: Internal Medicine

## 2013-10-02 ENCOUNTER — Ambulatory Visit: Payer: BC Managed Care – PPO

## 2013-10-02 VITALS — BP 140/80 | HR 80 | Temp 99.4°F | Resp 16 | Wt 229.0 lb

## 2013-10-02 DIAGNOSIS — R059 Cough, unspecified: Secondary | ICD-10-CM

## 2013-10-02 DIAGNOSIS — J45909 Unspecified asthma, uncomplicated: Secondary | ICD-10-CM | POA: Insufficient documentation

## 2013-10-02 DIAGNOSIS — R05 Cough: Secondary | ICD-10-CM

## 2013-10-02 MED ORDER — LEVOFLOXACIN 500 MG PO TABS: 500.0000 mg | ORAL_TABLET | Freq: Every day | ORAL | Status: DC

## 2013-10-02 MED ORDER — FLUTICASONE FUROATE-VILANTEROL 100-25 MCG/INH IN AEPB: 1.0000 | INHALATION_SPRAY | Freq: Every day | RESPIRATORY_TRACT | Status: DC

## 2013-10-02 MED ORDER — PROMETHAZINE-CODEINE 6.25-10 MG/5ML PO SYRP: 5.0000 mL | ORAL_SOLUTION | ORAL | Status: DC | PRN

## 2013-10-02 NOTE — Progress Notes (Deleted)
Pre visit review using our clinic review tool, if applicable. No additional management support is needed unless otherwise documented below in the visit note. 

## 2013-10-02 NOTE — Assessment & Plan Note (Signed)
CXR Start Breo Levaquin x 10 or 14 days

## 2013-10-02 NOTE — Patient Instructions (Signed)
Use over-the-counter  "cold" medicines  such as "Tylenol cold" , "Advil cold",  "Mucinex" or" Mucinex D"  for cough and congestion.   Avoid decongestants if you have high blood pressure and use "Afrin" nasal spray for nasal congestion as directed instead. Use" Delsym" or" Robitussin" cough syrup varietis for cough.  You can use plain "Tylenol" or "Advil" for fever, chills and achyness.  Please, make an appointment if you are not better or if you're worse.  

## 2013-10-02 NOTE — Progress Notes (Signed)
   Subjective:    Patient ID: Adrian [name], male    DOB: 1950-08-30, 63 y.o.   MRN: [id number]  URI  This is a new problem. The current episode started 1 to 4 weeks ago (3 wks). The problem has been gradually worsening. The maximum temperature recorded prior to his arrival was 100 - 100.9 F. The fever has been present for 5 days or more. Associated symptoms include congestion, coughing, headaches, rhinorrhea, sinus pain, a sore throat and wheezing. He has tried decongestant and acetaminophen (zpac) for the symptoms. The treatment provided no relief.      Review of Systems  Constitutional: Positive for fever.  HENT: Positive for congestion, rhinorrhea, sinus pressure and sore throat.   Respiratory: Positive for cough, chest tightness, shortness of breath and wheezing.   Genitourinary: Negative for frequency.  Neurological: Positive for weakness and headaches.       Objective:   Physical Exam  Constitutional: He is oriented to person, place, and time. He appears well-developed. No distress.  NAD  HENT:  Mouth/Throat: Oropharynx is clear and moist. No oropharyngeal exudate.  Eyes: Conjunctivae are normal. Pupils are equal, round, and reactive to light. Left eye exhibits no discharge.  Neck: Normal range of motion. No JVD present. No thyromegaly present.  Cardiovascular: Normal rate, regular rhythm, normal heart sounds and intact distal pulses.  Exam reveals no gallop and no friction rub.   No murmur heard. Pulmonary/Chest: Effort normal. No respiratory distress. He has wheezes. He has rales. He exhibits no tenderness.  Abdominal: Soft. Bowel sounds are normal. He exhibits no distension and no mass. There is no tenderness. There is no rebound and no guarding.  Musculoskeletal: Normal range of motion. He exhibits no edema and no tenderness.  Lymphadenopathy:    He has no cervical adenopathy.  Neurological: He is alert and oriented to person, place, and time. He has normal reflexes. No  cranial nerve deficit. He exhibits normal muscle tone. He displays a negative Romberg sign. Coordination and gait normal.  No meningeal signs  Skin: Skin is warm and dry. No rash noted.  Psychiatric: He has a normal mood and affect. His behavior is normal. Judgment and thought content normal.    I personally provided Breo inhaler use teaching. After the teaching patient was able to demonstrate it's use effectively. All questions were answered        Assessment & Plan:

## 2013-10-02 NOTE — Assessment & Plan Note (Signed)
Breo Levaquin CXR Prom-cod syr

## 2013-11-28 ENCOUNTER — Ambulatory Visit (INDEPENDENT_AMBULATORY_CARE_PROVIDER_SITE_OTHER): Payer: BC Managed Care – PPO | Admitting: Internal Medicine

## 2013-11-28 ENCOUNTER — Encounter: Payer: Self-pay | Admitting: Internal Medicine

## 2013-11-28 VITALS — BP 110/70 | HR 72 | Temp 98.3°F | Resp 16 | Wt 225.0 lb

## 2013-11-28 DIAGNOSIS — I10 Essential (primary) hypertension: Secondary | ICD-10-CM

## 2013-11-28 DIAGNOSIS — R05 Cough: Secondary | ICD-10-CM

## 2013-11-28 DIAGNOSIS — J309 Allergic rhinitis, unspecified: Secondary | ICD-10-CM

## 2013-11-28 DIAGNOSIS — R059 Cough, unspecified: Secondary | ICD-10-CM

## 2013-11-28 DIAGNOSIS — J45901 Unspecified asthma with (acute) exacerbation: Secondary | ICD-10-CM

## 2013-11-28 MED ORDER — PROMETHAZINE-CODEINE 6.25-10 MG/5ML PO SYRP: 5.0000 mL | ORAL_SOLUTION | ORAL | Status: DC | PRN

## 2013-11-28 MED ORDER — AZITHROMYCIN 250 MG PO TABS: ORAL_TABLET | ORAL | Status: DC

## 2013-11-28 MED ORDER — METHYLPREDNISOLONE ACETATE 80 MG/ML IJ SUSP
80.0000 mg | Freq: Once | INTRAMUSCULAR | Status: AC
  Administered 2013-11-28: 80 mg via INTRAMUSCULAR

## 2013-11-28 MED ORDER — FLUTICASONE FUROATE-VILANTEROL 100-25 MCG/INH IN AEPB: 1.0000 | INHALATION_SPRAY | Freq: Every day | RESPIRATORY_TRACT | Status: DC

## 2013-11-28 NOTE — Progress Notes (Signed)
Pre visit review using our clinic review tool, if applicable. No additional management support is needed unless otherwise documented below in the visit note. 

## 2013-11-28 NOTE — Assessment & Plan Note (Signed)
Prom-cod syr Zpac Breo Depomedrol 80 mg im

## 2013-11-29 ENCOUNTER — Ambulatory Visit: Payer: BC Managed Care – PPO | Admitting: Internal Medicine

## 2013-12-04 ENCOUNTER — Encounter: Payer: Self-pay | Admitting: Internal Medicine

## 2013-12-06 ENCOUNTER — Other Ambulatory Visit: Payer: Self-pay | Admitting: Internal Medicine

## 2013-12-06 MED ORDER — AZITHROMYCIN 250 MG PO TABS: ORAL_TABLET | ORAL | Status: DC

## 2013-12-18 ENCOUNTER — Other Ambulatory Visit: Payer: Self-pay | Admitting: Dermatology

## 2013-12-18 NOTE — Assessment & Plan Note (Signed)
See Rx 

## 2013-12-18 NOTE — Assessment & Plan Note (Signed)
Continue with current prescription therapy as reflected on the Med list.  

## 2013-12-18 NOTE — Progress Notes (Signed)
Patient ID: Adrian [name], male   DOB: [birth year], 63 y.o.   MRN: [id number]   Subjective:    Patient ID: Adrian [name], male    DOB: 11/24/1950, 63 y.o.   MRN: [id number]  URI  This is a new problem. The current episode started in the past 7 days (3 wks). The problem has been gradually worsening. The maximum temperature recorded prior to his arrival was 100 - 100.9 F. The fever has been present for 3 to 4 days. Associated symptoms include congestion, coughing, headaches, rhinorrhea, sinus pain, a sore throat and wheezing. Pertinent negatives include no chest pain. He has tried decongestant and acetaminophen (zpac) for the symptoms. The treatment provided mild relief.      Review of Systems  Constitutional: Positive for fever.  HENT: Positive for congestion, rhinorrhea, sinus pressure and sore throat.   Respiratory: Positive for cough, chest tightness, shortness of breath and wheezing.   Cardiovascular: Negative for chest pain.  Genitourinary: Negative for frequency.  Neurological: Positive for weakness and headaches.       Objective:   Physical Exam  Constitutional: He is oriented to person, place, and time. He appears well-developed. No distress.  NAD  HENT:  Mouth/Throat: Oropharynx is clear and moist. No oropharyngeal exudate.  Eyes: Conjunctivae are normal. Pupils are equal, round, and reactive to light. Left eye exhibits no discharge.  Neck: Normal range of motion. No JVD present. No thyromegaly present.  Cardiovascular: Normal rate, regular rhythm, normal heart sounds and intact distal pulses.  Exam reveals no gallop and no friction rub.   No murmur heard. Pulmonary/Chest: Effort normal. No respiratory distress. He has wheezes. He has rales. He exhibits no tenderness.  Abdominal: Soft. Bowel sounds are normal. He exhibits no distension and no mass. There is no tenderness. There is no rebound and no guarding.  Musculoskeletal: Normal range of motion. He exhibits no edema and no  tenderness.  Lymphadenopathy:    He has no cervical adenopathy.  Neurological: He is alert and oriented to person, place, and time. He has normal reflexes. No cranial nerve deficit. He exhibits normal muscle tone. He displays a negative Romberg sign. Coordination and gait normal.  No meningeal signs  Skin: Skin is warm and dry. No rash noted.  Psychiatric: He has a normal mood and affect. His behavior is normal. Judgment and thought content normal.            Assessment & Plan:

## 2013-12-27 ENCOUNTER — Ambulatory Visit (INDEPENDENT_AMBULATORY_CARE_PROVIDER_SITE_OTHER): Payer: BC Managed Care – PPO

## 2013-12-27 DIAGNOSIS — Z23 Encounter for immunization: Secondary | ICD-10-CM

## 2014-02-22 ENCOUNTER — Ambulatory Visit (INDEPENDENT_AMBULATORY_CARE_PROVIDER_SITE_OTHER): Payer: BC Managed Care – PPO | Admitting: *Deleted

## 2014-02-22 DIAGNOSIS — Z23 Encounter for immunization: Secondary | ICD-10-CM

## 2014-02-26 ENCOUNTER — Ambulatory Visit: Payer: BC Managed Care – PPO

## 2014-03-29 ENCOUNTER — Encounter: Payer: BC Managed Care – PPO | Admitting: Internal Medicine

## 2014-04-08 ENCOUNTER — Other Ambulatory Visit (INDEPENDENT_AMBULATORY_CARE_PROVIDER_SITE_OTHER): Payer: BC Managed Care – PPO

## 2014-04-08 ENCOUNTER — Encounter: Payer: Self-pay | Admitting: Internal Medicine

## 2014-04-08 ENCOUNTER — Ambulatory Visit (INDEPENDENT_AMBULATORY_CARE_PROVIDER_SITE_OTHER): Payer: BC Managed Care – PPO | Admitting: Internal Medicine

## 2014-04-08 VITALS — BP 140/90 | HR 57 | Temp 98.5°F | Ht 70.0 in | Wt 216.0 lb

## 2014-04-08 DIAGNOSIS — E538 Deficiency of other specified B group vitamins: Secondary | ICD-10-CM

## 2014-04-08 DIAGNOSIS — K2100 Gastro-esophageal reflux disease with esophagitis, without bleeding: Secondary | ICD-10-CM

## 2014-04-08 DIAGNOSIS — I10 Essential (primary) hypertension: Secondary | ICD-10-CM

## 2014-04-08 DIAGNOSIS — Z0000 Encounter for general adult medical examination without abnormal findings: Secondary | ICD-10-CM

## 2014-04-08 DIAGNOSIS — E038 Other specified hypothyroidism: Secondary | ICD-10-CM

## 2014-04-08 DIAGNOSIS — E034 Atrophy of thyroid (acquired): Secondary | ICD-10-CM

## 2014-04-08 DIAGNOSIS — R319 Hematuria, unspecified: Secondary | ICD-10-CM | POA: Insufficient documentation

## 2014-04-08 LAB — URINALYSIS
BILIRUBIN URINE: NEGATIVE
Hgb urine dipstick: NEGATIVE
Ketones, ur: NEGATIVE
Leukocytes, UA: NEGATIVE
NITRITE: NEGATIVE
Specific Gravity, Urine: <=1.005 — AB (ref 1.000–1.030)
TOTAL PROTEIN, URINE-UPE24: NEGATIVE
Urine Glucose: NEGATIVE
Urobilinogen, UA: 0.2 (ref 0.0–1.0)
pH: 6 (ref 5.0–8.0)

## 2014-04-08 LAB — LIPID PANEL
CHOL/HDL RATIO: 5
Cholesterol: 173 mg/dL (ref 0–200)
HDL: 33.6 mg/dL — ABNORMAL LOW (ref >39.00)
LDL Cholesterol: 116 mg/dL — ABNORMAL HIGH (ref 0–99)
NonHDL: 139.4
TRIGLYCERIDES: 115 mg/dL (ref 0.0–149.0)
VLDL: 23 mg/dL (ref 0.0–40.0)

## 2014-04-08 LAB — CBC WITH DIFFERENTIAL/PLATELET
BASOS PCT: 0.3 % (ref 0.0–3.0)
Basophils Absolute: 0 10*3/uL (ref 0.0–0.1)
Eosinophils Absolute: 0.2 10*3/uL (ref 0.0–0.7)
Eosinophils Relative: 2.9 % (ref 0.0–5.0)
HCT: 45.2 % (ref 39.0–52.0)
Hemoglobin: 14.9 g/dL (ref 13.0–17.0)
Lymphocytes Relative: 27 % (ref 12.0–46.0)
Lymphs Abs: 2.1 10*3/uL (ref 0.7–4.0)
MCHC: 33 g/dL (ref 30.0–36.0)
MCV: 89.2 fl (ref 78.0–100.0)
Monocytes Absolute: 0.5 10*3/uL (ref 0.1–1.0)
Monocytes Relative: 7 % (ref 3.0–12.0)
Neutro Abs: 4.9 10*3/uL (ref 1.4–7.7)
Neutrophils Relative %: 62.8 % (ref 43.0–77.0)
PLATELETS: 173 10*3/uL (ref 150.0–400.0)
RBC: 5.07 Mil/uL (ref 4.22–5.81)
RDW: 13.4 % (ref 11.5–15.5)
WBC: 7.8 10*3/uL (ref 4.0–10.5)

## 2014-04-08 LAB — HEPATIC FUNCTION PANEL
ALBUMIN: 3.9 g/dL (ref 3.5–5.2)
ALT: 31 U/L (ref 0–53)
AST: 20 U/L (ref 0–37)
Alkaline Phosphatase: 70 U/L (ref 39–117)
BILIRUBIN DIRECT: 0.1 mg/dL (ref 0.0–0.3)
TOTAL PROTEIN: 7.3 g/dL (ref 6.0–8.3)
Total Bilirubin: 0.8 mg/dL (ref 0.2–1.2)

## 2014-04-08 LAB — TSH: TSH: 4.93 u[IU]/mL — ABNORMAL HIGH (ref 0.35–4.50)

## 2014-04-08 LAB — BASIC METABOLIC PANEL
BUN: 18 mg/dL (ref 6–23)
CHLORIDE: 101 meq/L (ref 96–112)
CO2: 27 mEq/L (ref 19–32)
Calcium: 9.7 mg/dL (ref 8.4–10.5)
Creatinine, Ser: 1.3 mg/dL (ref 0.4–1.5)
GFR: 58.69 mL/min — ABNORMAL LOW (ref >60.00)
GLUCOSE: 102 mg/dL — AB (ref 70–99)
Potassium: 4.7 mEq/L (ref 3.5–5.1)
Sodium: 138 mEq/L (ref 135–145)

## 2014-04-08 LAB — PSA: PSA: 0.75 ng/mL (ref 0.10–4.00)

## 2014-04-08 MED ORDER — VITAMIN D 1000 UNITS PO TABS: 1000.0000 [IU] | ORAL_TABLET | Freq: Every day | ORAL | Status: AC

## 2014-04-08 MED ORDER — LOSARTAN POTASSIUM 100 MG PO TABS: 50.0000 mg | ORAL_TABLET | Freq: Every day | ORAL | Status: DC

## 2014-04-08 MED ORDER — LEVOTHYROXINE SODIUM 50 MCG PO TABS: 50.0000 ug | ORAL_TABLET | Freq: Every day | ORAL | Status: DC

## 2014-04-08 MED ORDER — LEVOTHYROXINE SODIUM 75 MCG PO TABS: 75.0000 ug | ORAL_TABLET | Freq: Every day | ORAL | Status: DC

## 2014-04-08 MED ORDER — TRAZODONE HCL 100 MG PO TABS: 50.0000 mg | ORAL_TABLET | Freq: Every day | ORAL | Status: DC

## 2014-04-08 NOTE — Assessment & Plan Note (Signed)
Continue with current prescription therapy as reflected on the Med list.  

## 2014-04-08 NOTE — Assessment & Plan Note (Signed)
We discussed age appropriate health related issues, including available/recomended screening tests and vaccinations. We discussed a need for adhering to healthy diet and exercise. Labs/EKG were reviewed/ordered. All questions were answered.   

## 2014-04-08 NOTE — Progress Notes (Signed)
Pre visit review using our clinic review tool, if applicable. No additional management support is needed unless otherwise documented below in the visit note. 

## 2014-05-20 ENCOUNTER — Other Ambulatory Visit: Payer: Self-pay | Admitting: Internal Medicine

## 2014-05-31 ENCOUNTER — Encounter: Payer: Self-pay | Admitting: Internal Medicine

## 2014-05-31 DIAGNOSIS — C801 Malignant (primary) neoplasm, unspecified: Secondary | ICD-10-CM

## 2014-05-31 HISTORY — DX: Malignant (primary) neoplasm, unspecified: C80.1

## 2014-06-03 ENCOUNTER — Other Ambulatory Visit: Payer: Self-pay | Admitting: Internal Medicine

## 2014-06-03 ENCOUNTER — Other Ambulatory Visit: Payer: Self-pay

## 2014-06-03 ENCOUNTER — Other Ambulatory Visit: Payer: Self-pay | Admitting: *Deleted

## 2014-06-03 MED ORDER — LEVOTHYROXINE SODIUM 75 MCG PO TABS: 75.0000 ug | ORAL_TABLET | Freq: Every day | ORAL | Status: DC

## 2014-06-04 ENCOUNTER — Encounter: Payer: Self-pay | Admitting: Internal Medicine

## 2014-06-04 ENCOUNTER — Other Ambulatory Visit: Payer: Self-pay | Admitting: *Deleted

## 2014-06-04 ENCOUNTER — Ambulatory Visit (INDEPENDENT_AMBULATORY_CARE_PROVIDER_SITE_OTHER): Payer: BLUE CROSS/BLUE SHIELD | Admitting: Internal Medicine

## 2014-06-04 VITALS — BP 130/80 | HR 54 | Temp 98.2°F | Wt 226.0 lb

## 2014-06-04 DIAGNOSIS — J069 Acute upper respiratory infection, unspecified: Secondary | ICD-10-CM

## 2014-06-04 MED ORDER — LEVOTHYROXINE SODIUM 75 MCG PO TABS: 75.0000 ug | ORAL_TABLET | Freq: Every day | ORAL | Status: DC

## 2014-06-04 MED ORDER — LEVOFLOXACIN 500 MG PO TABS: 500.0000 mg | ORAL_TABLET | Freq: Every day | ORAL | Status: DC

## 2014-06-04 MED ORDER — PROMETHAZINE-CODEINE 6.25-10 MG/5ML PO SYRP: 5.0000 mL | ORAL_SOLUTION | ORAL | Status: DC | PRN

## 2014-06-04 NOTE — Assessment & Plan Note (Signed)
Sinusitis, bronchitis 1/16  Levaquin Prom-cod syr

## 2014-06-04 NOTE — Progress Notes (Signed)
Pre visit review using our clinic review tool, if applicable. No additional management support is needed unless otherwise documented below in the visit note. 

## 2014-06-04 NOTE — Progress Notes (Signed)
Patient ID: Adrian [name], male   DOB: 01/12/1951, 64 y.o.   MRN: [id number]   Subjective:    Patient ID: Adrian [name], male    DOB: 06/05/1950, 64 y.o.   MRN: [id number]  URI  The current episode started 1 to 4 weeks ago. The maximum temperature recorded prior to his arrival was 100.4 - 100.9 F. Associated symptoms include congestion, coughing, headaches and rhinorrhea. Pertinent negatives include no chest pain, sore throat or wheezing.      Review of Systems  Constitutional: Positive for fever.  HENT: Positive for congestion, rhinorrhea and sinus pressure. Negative for sore throat.   Respiratory: Positive for cough, chest tightness and shortness of breath. Negative for wheezing.   Cardiovascular: Negative for chest pain.  Genitourinary: Negative for frequency.  Neurological: Positive for weakness and headaches.       Objective:   Physical Exam  Constitutional: He is oriented to person, place, and time. He appears well-developed. No distress.  NAD  HENT:  Mouth/Throat: Oropharynx is clear and moist.  Eyes: Conjunctivae are normal. Pupils are equal, round, and reactive to light.  Neck: Normal range of motion. No JVD present. No thyromegaly present.  Cardiovascular: Normal rate, regular rhythm, normal heart sounds and intact distal pulses.  Exam reveals no gallop and no friction rub.   No murmur heard. Pulmonary/Chest: Effort normal and breath sounds normal. No respiratory distress. He has no wheezes. He has no rales. He exhibits no tenderness.  Abdominal: Soft. Bowel sounds are normal. He exhibits no distension and no mass. There is no tenderness. There is no rebound and no guarding.  Musculoskeletal: Normal range of motion. He exhibits no edema or tenderness.  Lymphadenopathy:    He has no cervical adenopathy.  Neurological: He is alert and oriented to person, place, and time. He has normal reflexes. No cranial nerve deficit. He exhibits normal muscle tone. He displays a  negative Romberg sign. Coordination and gait normal.  No meningeal signs  Skin: Skin is warm and dry. No rash noted.  Psychiatric: He has a normal mood and affect. His behavior is normal. Judgment and thought content normal.            Assessment & Plan:

## 2014-07-16 ENCOUNTER — Other Ambulatory Visit: Payer: Self-pay | Admitting: Internal Medicine

## 2014-11-15 ENCOUNTER — Other Ambulatory Visit: Payer: Self-pay | Admitting: Internal Medicine

## 2014-12-20 ENCOUNTER — Other Ambulatory Visit: Payer: Self-pay | Admitting: Internal Medicine

## 2014-12-25 ENCOUNTER — Telehealth: Payer: Self-pay | Admitting: Internal Medicine

## 2014-12-25 MED ORDER — JAPAN ENCEPHALITIS VIRUS VACC ~~LOC~~ SOLR: 0.5000 mL | Freq: Once | SUBCUTANEOUS | Status: DC

## 2014-12-25 NOTE — Telephone Encounter (Signed)
Needs Jap B enc vaccine

## 2014-12-26 ENCOUNTER — Other Ambulatory Visit: Payer: Self-pay | Admitting: *Deleted

## 2014-12-26 MED ORDER — JAPAN ENCEPHALITIS VIRUS VACC ~~LOC~~ SOLR: 0.5000 mL | Freq: Once | SUBCUTANEOUS | Status: DC

## 2015-02-20 ENCOUNTER — Ambulatory Visit (INDEPENDENT_AMBULATORY_CARE_PROVIDER_SITE_OTHER): Payer: BLUE CROSS/BLUE SHIELD

## 2015-02-20 DIAGNOSIS — Z23 Encounter for immunization: Secondary | ICD-10-CM

## 2015-02-21 ENCOUNTER — Ambulatory Visit: Payer: BLUE CROSS/BLUE SHIELD

## 2015-04-10 ENCOUNTER — Ambulatory Visit (INDEPENDENT_AMBULATORY_CARE_PROVIDER_SITE_OTHER): Payer: BLUE CROSS/BLUE SHIELD | Admitting: Internal Medicine

## 2015-04-10 ENCOUNTER — Other Ambulatory Visit (INDEPENDENT_AMBULATORY_CARE_PROVIDER_SITE_OTHER): Payer: BLUE CROSS/BLUE SHIELD

## 2015-04-10 ENCOUNTER — Encounter: Payer: Self-pay | Admitting: Internal Medicine

## 2015-04-10 VITALS — BP 116/80 | HR 59 | Ht 70.0 in | Wt 212.0 lb

## 2015-04-10 DIAGNOSIS — Z0000 Encounter for general adult medical examination without abnormal findings: Secondary | ICD-10-CM

## 2015-04-10 DIAGNOSIS — Z23 Encounter for immunization: Secondary | ICD-10-CM

## 2015-04-10 DIAGNOSIS — E538 Deficiency of other specified B group vitamins: Secondary | ICD-10-CM

## 2015-04-10 DIAGNOSIS — E034 Atrophy of thyroid (acquired): Secondary | ICD-10-CM

## 2015-04-10 LAB — CBC WITH DIFFERENTIAL/PLATELET
BASOS ABS: 0 10*3/uL (ref 0.0–0.1)
Basophils Relative: 0.4 % (ref 0.0–3.0)
EOS ABS: 0.3 10*3/uL (ref 0.0–0.7)
Eosinophils Relative: 4.9 % (ref 0.0–5.0)
HEMATOCRIT: 43.4 % (ref 39.0–52.0)
HEMOGLOBIN: 14.7 g/dL (ref 13.0–17.0)
LYMPHS PCT: 25.5 % (ref 12.0–46.0)
Lymphs Abs: 1.7 10*3/uL (ref 0.7–4.0)
MCHC: 33.8 g/dL (ref 30.0–36.0)
MCV: 86.9 fl (ref 78.0–100.0)
Monocytes Absolute: 0.6 10*3/uL (ref 0.1–1.0)
Monocytes Relative: 8.6 % (ref 3.0–12.0)
NEUTROS ABS: 4.1 10*3/uL (ref 1.4–7.7)
Neutrophils Relative %: 60.6 % (ref 43.0–77.0)
PLATELETS: 150 10*3/uL (ref 150.0–400.0)
RBC: 4.99 Mil/uL (ref 4.22–5.81)
RDW: 13.7 % (ref 11.5–15.5)
WBC: 6.8 10*3/uL (ref 4.0–10.5)

## 2015-04-10 LAB — PSA: PSA: 0.71 ng/mL (ref 0.10–4.00)

## 2015-04-10 LAB — URINALYSIS
BILIRUBIN URINE: NEGATIVE
Ketones, ur: NEGATIVE
Leukocytes, UA: NEGATIVE
NITRITE: NEGATIVE
Specific Gravity, Urine: 1.02 (ref 1.000–1.030)
TOTAL PROTEIN, URINE-UPE24: NEGATIVE
URINE GLUCOSE: NEGATIVE
Urobilinogen, UA: 0.2 (ref 0.0–1.0)
pH: 6 (ref 5.0–8.0)

## 2015-04-10 LAB — BASIC METABOLIC PANEL
BUN: 23 mg/dL (ref 6–23)
CALCIUM: 9.6 mg/dL (ref 8.4–10.5)
CO2: 28 mEq/L (ref 19–32)
CREATININE: 1.77 mg/dL — AB (ref 0.40–1.50)
Chloride: 103 mEq/L (ref 96–112)
GFR: 41.34 mL/min — AB (ref >60.00)
Glucose, Bld: 112 mg/dL — ABNORMAL HIGH (ref 70–99)
Potassium: 4.7 mEq/L (ref 3.5–5.1)
SODIUM: 140 meq/L (ref 135–145)

## 2015-04-10 LAB — TSH: TSH: 2.51 u[IU]/mL (ref 0.35–4.50)

## 2015-04-10 LAB — LIPID PANEL
CHOL/HDL RATIO: 3
Cholesterol: 134 mg/dL (ref 0–200)
HDL: 40.2 mg/dL (ref >39.00)
LDL Cholesterol: 77 mg/dL (ref 0–99)
NONHDL: 93.97
TRIGLYCERIDES: 86 mg/dL (ref 0.0–149.0)
VLDL: 17.2 mg/dL (ref 0.0–40.0)

## 2015-04-10 LAB — HEPATIC FUNCTION PANEL
ALT: 17 U/L (ref 0–53)
AST: 12 U/L (ref 0–37)
Albumin: 4.2 g/dL (ref 3.5–5.2)
Alkaline Phosphatase: 63 U/L (ref 39–117)
BILIRUBIN DIRECT: 0.1 mg/dL (ref 0.0–0.3)
TOTAL PROTEIN: 6.9 g/dL (ref 6.0–8.3)
Total Bilirubin: 0.6 mg/dL (ref 0.2–1.2)

## 2015-04-10 MED ORDER — HALOBETASOL PROPIONATE 0.05 % EX CREA: TOPICAL_CREAM | CUTANEOUS | Status: DC

## 2015-04-10 MED ORDER — LEVOTHYROXINE SODIUM 75 MCG PO TABS: 75.0000 ug | ORAL_TABLET | Freq: Every day | ORAL | Status: DC

## 2015-04-10 MED ORDER — TRAZODONE HCL 100 MG PO TABS: 50.0000 mg | ORAL_TABLET | Freq: Every day | ORAL | Status: DC

## 2015-04-10 MED ORDER — GENTAMICIN SULFATE 0.1 % EX OINT: TOPICAL_OINTMENT | Freq: Three times a day (TID) | CUTANEOUS | Status: DC

## 2015-04-10 MED ORDER — LOSARTAN POTASSIUM 100 MG PO TABS: 50.0000 mg | ORAL_TABLET | Freq: Every day | ORAL | Status: DC

## 2015-04-10 NOTE — Assessment & Plan Note (Signed)
On Levothroid 

## 2015-04-10 NOTE — Progress Notes (Signed)
Subjective:  Patient ID: Adrian [name], male    DOB: April 29, 1951  Age: 64 y.o. MRN: [id number]  CC: Annual Exam   HPI Marie Correa presents for a well exam. Pt came back from [place] on Nov 1st. Pt lost wt  Outpatient Prescriptions Prior to Visit  Medication Sig Dispense Refill  . aspirin 81 MG tablet Take 1 tablet (81 mg total) by mouth at bedtime. 30 tablet 11  . levothyroxine (SYNTHROID, LEVOTHROID) 75 MCG tablet Take 1 tablet (75 mcg total) by mouth daily. 90 tablet 1  . loratadine (CLARITIN) 10 MG tablet Take 10 mg by mouth daily.      Marland Kitchen losartan (COZAAR) 100 MG tablet TAKE ONE-HALF TABLET BY MOUTH ONCE DAILY 90 tablet 3  . Magnesium 100 MG CAPS Take 1 capsule by mouth daily.    . Melatonin 10 MG TABS Take 1 each by mouth at bedtime.      . Multiple Vitamins-Minerals (CENTRUM SILVER ADULT 50+ PO) Take 1 tablet by mouth at bedtime.    . Probiotic Product (PROBIOTIC DAILY PO) Take 1 tablet by mouth at bedtime.    . traZODone (DESYREL) 100 MG tablet TAKE ONE-HALF TABLET BY MOUTH AT BEDTIME 90 tablet 0  . VALERIAN ROOT PO Take 1,500 mg by mouth at bedtime.    . Zinc 50 MG TABS Take 1 tablet by mouth every morning.      . [place] encephalitis (JE-VAX) injection Inject 0.5 mLs into the skin once. Repeat in 1 month 2 each 0  . levofloxacin (LEVAQUIN) 500 MG tablet Take 1 tablet (500 mg total) by mouth daily. 10 tablet 0  . Fluticasone Furoate-Vilanterol (BREO ELLIPTA) 100-25 MCG/INH AEPB Inhale 1 Act into the lungs daily. (Patient not taking: Reported on 04/10/2015) 1 each 3  . GARCINIA CAMBOGIA-CHROMIUM PO Take 1 tablet by mouth.    Marland Kitchen gentamicin ointment (GARAMYCIN) 0.1 % Apply topically 3 (three) times daily. (Patient not taking: Reported on 04/10/2015) 30 g 0  . guaiFENesin (MUCINEX) 600 MG 12 hr tablet Take by mouth 2 (two) times daily.    . halobetasol (ULTRAVATE) 0.05 % cream APPLY CREAM TOPICALLY TWICE DAILY AS NEEDED (Patient not taking: Reported on 04/10/2015) 50 g 1  .  promethazine-codeine (PHENERGAN WITH CODEINE) 6.25-10 MG/5ML syrup Take 5 mLs by mouth every 4 (four) hours as needed. (Patient not taking: Reported on 04/10/2015) 300 mL 0  . ranitidine (ZANTAC) 150 MG tablet Take 150 mg by mouth at bedtime.      No facility-administered medications prior to visit.    ROS Review of Systems  Constitutional: Negative for appetite change, fatigue and unexpected weight change.  HENT: Negative for congestion, nosebleeds, sneezing, sore throat and trouble swallowing.   Eyes: Negative for itching and visual disturbance.  Respiratory: Negative for cough.   Cardiovascular: Negative for chest pain, palpitations and leg swelling.  Gastrointestinal: Negative for nausea, diarrhea, blood in stool and abdominal distention.  Genitourinary: Negative for frequency and hematuria.  Musculoskeletal: Negative for back pain, joint swelling, gait problem and neck pain.  Skin: Negative for rash.  Neurological: Negative for dizziness, tremors, speech difficulty and weakness.  Psychiatric/Behavioral: Negative for suicidal ideas, sleep disturbance, dysphoric mood and agitation. The patient is not nervous/anxious.     Objective:  BP 116/80 mmHg  Pulse 59  Ht 5\' 10"  (1.778 m)  Wt 212 lb (96.163 kg)  BMI 30.42 kg/m2  SpO2 97%  BP Readings from Last 3 Encounters:  04/10/15 116/80  06/04/14 130/80  04/08/14 140/90    Wt Readings from Last 3 Encounters:  04/10/15 212 lb (96.163 kg)  06/04/14 226 lb (102.513 kg)  04/08/14 216 lb (97.977 kg)    Physical Exam  Constitutional: He is oriented to person, place, and time. He appears well-developed and well-nourished. No distress.  HENT:  Head: Normocephalic and atraumatic.  Right Ear: External ear normal.  Left Ear: External ear normal.  Nose: Nose normal.  Mouth/Throat: Oropharynx is clear and moist. No oropharyngeal exudate.  Eyes: Conjunctivae and EOM are normal. Pupils are equal, round, and reactive to light. Right eye  exhibits no discharge. Left eye exhibits no discharge. No scleral icterus.  Neck: Normal range of motion. Neck supple. No JVD present. No tracheal deviation present. No thyromegaly present.  Cardiovascular: Normal rate, regular rhythm, normal heart sounds and intact distal pulses.  Exam reveals no gallop and no friction rub.   No murmur heard. Pulmonary/Chest: Effort normal and breath sounds normal. No stridor. No respiratory distress. He has no wheezes. He has no rales. He exhibits no tenderness.  Abdominal: Soft. Bowel sounds are normal. He exhibits no distension and no mass. There is no tenderness. There is no rebound and no guarding.  Genitourinary: Rectum normal, prostate normal and penis normal. Guaiac negative stool. No penile tenderness.  Musculoskeletal: Normal range of motion. He exhibits no edema or tenderness.  Lymphadenopathy:    He has no cervical adenopathy.  Neurological: He is alert and oriented to person, place, and time. He has normal reflexes. No cranial nerve deficit. He exhibits normal muscle tone. Coordination normal.  Skin: Skin is warm and dry. No rash noted. He is not diaphoretic. No erythema. No pallor.  Psychiatric: He has a normal mood and affect. His behavior is normal. Judgment and thought content normal.  scar on the nose tip  Lab Results  Component Value Date   WBC 7.8 04/08/2014   HGB 14.9 04/08/2014   HCT 45.2 04/08/2014   PLT 173.0 04/08/2014   GLUCOSE 102* 04/08/2014   CHOL 173 04/08/2014   TRIG 115.0 04/08/2014   HDL 33.60* 04/08/2014   LDLCALC 116* 04/08/2014   ALT 31 04/08/2014   AST 20 04/08/2014   NA 138 04/08/2014   K 4.7 04/08/2014   CL 101 04/08/2014   CREATININE 1.3 04/08/2014   BUN 18 04/08/2014   CO2 27 04/08/2014   TSH 4.93* 04/08/2014   PSA 0.75 04/08/2014   INR 1.0 RATIO 09/18/2007   HGBA1C 5.6 12/04/2008    Dg Chest 2 View  10/02/2013  CLINICAL DATA:  Cough and congestion EXAM: CHEST  2 VIEW COMPARISON:  None. FINDINGS:  Cardiac shadow is within normal limits. The lungs are well aerated without focal infiltrate or sizable effusion. A few tiny calcified granulomas are seen. No acute bony abnormality is noted. IMPRESSION: No active cardiopulmonary disease. Electronically Signed   By: Inez Catalina M.D.   On: 10/02/2013 16:57    Assessment & Plan:   There are no diagnoses linked to this encounter. I have discontinued Mr. Jacques's levofloxacin and [place] encephalitis. I am also having him maintain his loratadine, ranitidine, Melatonin, Zinc, Multiple Vitamins-Minerals (CENTRUM SILVER ADULT 50+ PO), Probiotic Product (PROBIOTIC DAILY PO), VALERIAN ROOT PO, aspirin, GARCINIA CAMBOGIA-CHROMIUM PO, Magnesium, gentamicin ointment, Fluticasone Furoate-Vilanterol, losartan, guaiFENesin, promethazine-codeine, halobetasol, traZODone, and levothyroxine.  No orders of the defined types were placed in this encounter.     Follow-up: No Follow-up on file.  Walker Kehr, MD

## 2015-04-10 NOTE — Progress Notes (Signed)
Pre visit review using our clinic review tool, if applicable. No additional management support is needed unless otherwise documented below in the visit note. 

## 2015-04-10 NOTE — Assessment & Plan Note (Signed)
We discussed age appropriate health related issues, including available/recomended screening tests and vaccinations. We discussed a need for adhering to healthy diet and exercise. Labs/EKG were reviewed/ordered. All questions were answered.   

## 2015-04-10 NOTE — Addendum Note (Signed)
Addended by: Cresenciano Lick on: 04/10/2015 10:02 AM   Modules accepted: Orders

## 2015-04-10 NOTE — Assessment & Plan Note (Signed)
On B12 

## 2015-06-01 HISTORY — PX: CATARACT EXTRACTION W/ INTRAOCULAR LENS  IMPLANT, BILATERAL: SHX1307

## 2015-06-03 ENCOUNTER — Encounter: Payer: Self-pay | Admitting: Internal Medicine

## 2015-06-04 ENCOUNTER — Ambulatory Visit (INDEPENDENT_AMBULATORY_CARE_PROVIDER_SITE_OTHER): Payer: BLUE CROSS/BLUE SHIELD | Admitting: Internal Medicine

## 2015-06-04 ENCOUNTER — Encounter: Payer: Self-pay | Admitting: Internal Medicine

## 2015-06-04 VITALS — BP 120/82 | HR 56 | Temp 98.5°F | Wt 219.0 lb

## 2015-06-04 DIAGNOSIS — G500 Trigeminal neuralgia: Secondary | ICD-10-CM

## 2015-06-04 DIAGNOSIS — H9209 Otalgia, unspecified ear: Secondary | ICD-10-CM | POA: Insufficient documentation

## 2015-06-04 DIAGNOSIS — H9202 Otalgia, left ear: Secondary | ICD-10-CM | POA: Diagnosis not present

## 2015-06-04 MED ORDER — TRAMADOL-ACETAMINOPHEN 37.5-325 MG PO TABS: 0.5000 | ORAL_TABLET | Freq: Four times a day (QID) | ORAL | Status: DC | PRN

## 2015-06-04 MED ORDER — ACYCLOVIR 800 MG PO TABS: 800.0000 mg | ORAL_TABLET | Freq: Every day | ORAL | Status: DC

## 2015-06-04 MED ORDER — MELOXICAM 15 MG PO TABS: 0.5000 mg | ORAL_TABLET | Freq: Every day | ORAL | Status: DC | PRN

## 2015-06-04 NOTE — Assessment & Plan Note (Signed)
1/17 flare up of neuralgia Empiric Acyclovir Meloxicam pc prn Tramadol/apap prn   Potential benefits of a short/long term opioids use as well as potential risks (i.e. addiction risk, apnea etc) and complications (i.e. Somnolence, constipation and others) were explained to the patient and were aknowledged.

## 2015-06-04 NOTE — Progress Notes (Signed)
Subjective:  Patient ID: Adrian [name], male    DOB: Feb 08, 1951  Age: 65 y.o. MRN: [id number]  CC: Ear Pain   HPI Adrian [name] [facility] presents for L ear pain x 1 week. Pain is 6-7/10 at times. No rash. 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 aspirin 81 MG tablet Take 1 tablet (81 mg total) by mouth at bedtime. 30 tablet 11  . Fluticasone Furoate-Vilanterol (BREO ELLIPTA) 100-25 MCG/INH AEPB Inhale 1 Act into the lungs daily. 1 each 3  . GARCINIA CAMBOGIA-CHROMIUM PO Take 1 tablet by mouth.    Marland Kitchen gentamicin ointment (GARAMYCIN) 0.1 % Apply topically 3 (three) times daily. 30 g 0  . guaiFENesin (MUCINEX) 600 MG 12 hr tablet Take by mouth 2 (two) times daily.    . halobetasol (ULTRAVATE) 0.05 % cream APPLY CREAM TOPICALLY TWICE DAILY AS NEEDED 50 g 1  . levothyroxine (SYNTHROID, LEVOTHROID) 75 MCG tablet Take 1 tablet (75 mcg total) by mouth daily. 90 tablet 3  . loratadine (CLARITIN) 10 MG tablet Take 10 mg by mouth daily.      Marland Kitchen losartan (COZAAR) 100 MG tablet Take 0.5 tablets (50 mg total) by mouth daily. 90 tablet 3  . Magnesium 100 MG CAPS Take 1 capsule by mouth daily.    . Melatonin 10 MG TABS Take 1 each by mouth at bedtime.      . Multiple Vitamins-Minerals (CENTRUM SILVER ADULT 50+ PO) Take 1 tablet by mouth at bedtime.    . Probiotic Product (PROBIOTIC DAILY PO) Take 1 tablet by mouth at bedtime.    . ranitidine (ZANTAC) 150 MG tablet Take 150 mg by mouth at bedtime.     . traZODone (DESYREL) 100 MG tablet Take 0.5 tablets (50 mg total) by mouth at bedtime. 90 tablet 3  . VALERIAN ROOT PO Take 1,500 mg by mouth at bedtime.    . Zinc 50 MG TABS Take 1 tablet by mouth every morning.      . promethazine-codeine (PHENERGAN WITH CODEINE) 6.25-10 MG/5ML syrup Take 5 mLs by mouth every 4 (four) hours as needed. 300 mL 0   No facility-administered medications prior to visit.    ROS Review  of Systems  Constitutional: Negative for fever, appetite change, fatigue and unexpected weight change.  HENT: Positive for ear pain and sinus pressure. Negative for congestion, nosebleeds, sneezing, sore throat and trouble swallowing.   Eyes: Negative for itching and visual disturbance.  Respiratory: Negative for cough.   Cardiovascular: Negative for chest pain, palpitations and leg swelling.  Gastrointestinal: Negative for nausea, diarrhea, blood in stool and abdominal distention.  Genitourinary: Negative for frequency and hematuria.  Musculoskeletal: Negative for back pain, joint swelling, gait problem and neck pain.  Skin: Negative for rash.  Neurological: Negative for dizziness, tremors, speech difficulty and weakness.  Psychiatric/Behavioral: Negative for sleep disturbance, dysphoric mood and agitation. The patient is not nervous/anxious.     Objective:  BP 120/82 mmHg  Pulse 56  Temp(Src) 98.5 F (36.9 C) (Oral)  Wt 219 lb (99.338 kg)  SpO2 97%  BP Readings from Last 3 Encounters:  06/04/15 120/82  04/10/15 116/80  06/04/14 130/80    Wt Readings from Last 3 Encounters:  06/04/15 219 lb (99.338 kg)  04/10/15 212 lb (96.163 kg)  06/04/14 226 lb (102.513 kg)    Physical Exam  Constitutional: He is oriented to person,  place, and time. He appears well-developed. No distress.  NAD  HENT:  Mouth/Throat: Oropharynx is clear and moist.  Eyes: Conjunctivae are normal. Pupils are equal, round, and reactive to light.  Neck: Normal range of motion. No JVD present. No thyromegaly present.  Cardiovascular: Normal rate, regular rhythm, normal heart sounds and intact distal pulses.  Exam reveals no gallop and no friction rub.   No murmur heard. Pulmonary/Chest: Effort normal and breath sounds normal. No respiratory distress. He has no wheezes. He has no rales. He exhibits no tenderness.  Abdominal: Soft. Bowel sounds are normal. He exhibits no distension and no mass. There is no  tenderness. There is no rebound and no guarding.  Musculoskeletal: Normal range of motion. He exhibits no edema or tenderness.  Lymphadenopathy:    He has no cervical adenopathy.  Neurological: He is alert and oriented to person, place, and time. He has normal reflexes. No cranial nerve deficit. He exhibits normal muscle tone. He displays a negative Romberg sign. Coordination and gait normal.  Skin: Skin is warm and dry. No rash noted.  Psychiatric: He has a normal mood and affect. His behavior is normal. Judgment and thought content normal.  L face is sensitive to touch above L eye; L TM WNL  Lab Results  Component Value Date   WBC 6.8 04/10/2015   HGB 14.7 04/10/2015   HCT 43.4 04/10/2015   PLT 150.0 04/10/2015   GLUCOSE 112* 04/10/2015   CHOL 134 04/10/2015   TRIG 86.0 04/10/2015   HDL 40.20 04/10/2015   LDLCALC 77 04/10/2015   ALT 17 04/10/2015   AST 12 04/10/2015   NA 140 04/10/2015   K 4.7 04/10/2015   CL 103 04/10/2015   CREATININE 1.77* 04/10/2015   BUN 23 04/10/2015   CO2 28 04/10/2015   TSH 2.51 04/10/2015   PSA 0.71 04/10/2015   INR 1.0 RATIO 09/18/2007   HGBA1C 5.6 12/04/2008    Dg Chest 2 View  10/02/2013  CLINICAL DATA:  Cough and congestion EXAM: CHEST  2 VIEW COMPARISON:  None. FINDINGS: Cardiac shadow is within normal limits. The lungs are well aerated without focal infiltrate or sizable effusion. A few tiny calcified granulomas are seen. No acute bony abnormality is noted. IMPRESSION: No active cardiopulmonary disease. Electronically Signed   By: Adrian [name] M.D.   On: 10/02/2013 16:57    Assessment & Plan:   Adrian [name] was seen today for ear pain.  Diagnoses and all orders for this visit:  NEURALGIA, TRIGEMINAL  Ear pain, left  Other orders -     acyclovir (ZOVIRAX) 800 MG tablet; Take 1 tablet (800 mg total) by mouth 5 (five) times daily. -     meloxicam (MOBIC) 15 MG tablet; Take 0.5 tablets (7.5 mg total) by mouth daily as needed for pain. -      traMADol-acetaminophen (ULTRACET) 37.5-325 MG tablet; Take 0.5-1 tablets by mouth every 6 (six) hours as needed for moderate pain or severe pain.   I have discontinued Adrian [name]'s promethazine-codeine. I am also having him start on acyclovir, meloxicam, and traMADol-acetaminophen. Additionally, I am having him maintain his loratadine, ranitidine, Melatonin, Zinc, Multiple Vitamins-Minerals (CENTRUM SILVER ADULT 50+ PO), Probiotic Product (PROBIOTIC DAILY PO), VALERIAN ROOT PO, aspirin, GARCINIA CAMBOGIA-CHROMIUM PO, Magnesium, Fluticasone Furoate-Vilanterol, guaiFENesin, levothyroxine, losartan, traZODone, halobetasol, and gentamicin ointment.  Meds ordered this encounter  Medications  . acyclovir (ZOVIRAX) 800 MG tablet    Sig: Take 1 tablet (800 mg total) by mouth 5 (five) times daily.  Dispense:  35 tablet    Refill:  0  . meloxicam (MOBIC) 15 MG tablet    Sig: Take 0.5 tablets (7.5 mg total) by mouth daily as needed for pain.    Dispense:  30 tablet    Refill:  3  . traMADol-acetaminophen (ULTRACET) 37.5-325 MG tablet    Sig: Take 0.5-1 tablets by mouth every 6 (six) hours as needed for moderate pain or severe pain.    Dispense:  100 tablet    Refill:  1     Follow-up: Return in about 4 weeks (around 07/02/2015) for a follow-up visit.  Walker Kehr, MD

## 2015-06-04 NOTE — Assessment & Plan Note (Addendum)
2007 L s/p gamma knife decompression at [facility] 1/17 flare up Empiric Acyclovir Meloxicam pc prn Tramadol/apap prn   Potential benefits of a short/long term opioids use as well as potential risks (i.e. addiction risk, apnea etc) and complications (i.e. Somnolence, constipation and others) were explained to the patient and were aknowledged.

## 2015-06-04 NOTE — Progress Notes (Signed)
Pre visit review using our clinic review tool, if applicable. No additional management support is needed unless otherwise documented below in the visit note. 

## 2015-09-30 ENCOUNTER — Other Ambulatory Visit: Payer: Self-pay | Admitting: Internal Medicine

## 2015-10-01 NOTE — Telephone Encounter (Signed)
Lm with pharmacy with rx and provider information.

## 2015-10-15 ENCOUNTER — Encounter: Payer: Self-pay | Admitting: Internal Medicine

## 2015-10-20 ENCOUNTER — Other Ambulatory Visit: Payer: Self-pay | Admitting: Internal Medicine

## 2015-10-20 DIAGNOSIS — M25579 Pain in unspecified ankle and joints of unspecified foot: Secondary | ICD-10-CM

## 2015-10-24 ENCOUNTER — Encounter: Payer: Self-pay | Admitting: Internal Medicine

## 2015-10-27 ENCOUNTER — Other Ambulatory Visit: Payer: Self-pay | Admitting: Internal Medicine

## 2015-12-31 NOTE — Progress Notes (Signed)
[name] [name], [place] [address] Phone: [phone] Subjective:    I'm seeing this patient by the request  of:    CC: Right foot pain, right hand pain  [id number]  Adrian [name] is a 65 y.o. male coming in with complaint of  Right foot pain. Seems to be the large toe on his right foot.Patient sates his been going on several months. Completely localized around the first toe. Patient states that it seems to be getting bigger. States that hurts more when he walks barefoot or in certain shoes. Patient does like cycling the findings of very difficult to do certain activities because of the discomfort. Seems to be affecting some of the exercises he's been trying to do recently. Denies any nighttime pain. Denies any redness. Rates the severity pain is 6 out of 10  Also complaining of right hand pain. Seems to be more centered around his thumb. Worse when he moves his thumb in certain areas. Patient states patient states that sometimes is a grinding sensation. Sometimes feels weak. Usually a sharp pain that seems to go away after minutes. Has not notice any swelling. Does not remember any true injury. Rates the severity pain is 4 out of 10     Past Medical History:  Diagnosis Date  . Allergic rhinitis   . Cataract   . GERD (gastroesophageal reflux disease)   . Hyperlipidemia   . Hypertension   . Hypothyroidism 2010   mild  . Hypothyroidism   . Trigeminal neuralgia    L.  . Vitamin B12 deficiency 2010   Past Surgical History:  Procedure Laterality Date  . arthroscopic surgery for chondromalacia  1995  . COLONOSCOPY    . gamma knife surgery for trigeminal neuralgia  2005  . inguinal herniorrhahpy  1991   L.  Marland Kitchen POLYPECTOMY    . TONSILLECTOMY  1959  . TRIGEMINAL NERVE DECOMPRESSION  2008   Left  Dr. Claybon Jabs   Social History   Social History  . Marital status: Married    Spouse name: N/A  . Number of children: N/A  . Years of  education: N/A   Occupational History  . 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 Smoking status: Former Smoker    Quit date: 05/31/1992  . Smokeless tobacco: Never Used  . Alcohol use 4.2 oz/week    7 Glasses of wine per week  . Drug use: No  . Sexual activity: Yes   Other Topics Concern  . Not on file   Social History Narrative   UCD   Married x2: 12 years 30st; 15 years ('48) 2nd   One daughter, step daugfhter, step son   Likes to bike   Allergies  Allergen Reactions  . Tape Rash    Irritation  . Triple Antibiotic [Bacitracin-Neomycin-Polymyxin] Rash   Family History  Problem Relation Age of Onset  . Diabetes Other     1st degree relative  . Prostate cancer      1st degree relative <50  . Heart disease Mother 74    MI  . Diabetes Mother   . Cancer Father 53    prostate  . Cancer Sister 64    breast ca  . Stroke Brother   . Colon cancer Neg Hx   . Esophageal cancer Neg Hx   . Rectal cancer Neg Hx   . Stomach cancer Neg Hx  Past medical history, social, surgical and family history all reviewed in electronic medical record.  No pertanent information unless stated regarding to the chief complaint.   Review of Systems: No headache, visual changes, nausea, vomiting, diarrhea, constipation, dizziness, abdominal pain, skin rash, fevers, chills, night sweats, weight loss, swollen lymph nodes, body aches, joint swelling, muscle aches, chest pain, shortness of breath, mood changes.   Objective  There were no vitals taken for this visit.  General: No apparent distress alert and oriented x3 mood and affect normal, dressed appropriately.  HEENT: Pupils equal, extraocular movements intact  Respiratory: Patient's speak in full sentences and does not appear short of breath  Cardiovascular: No lower extremity edema, non tender, no erythema  Skin: Warm dry intact with no signs of infection or rash on extremities or on axial skeleton.    Abdomen: Soft nontender  Neuro: Cranial nerves II through XII are intact, neurovascularly intact in all extremities with 2+ DTRs and 2+ pulses.  Lymph: No lymphadenopathy of posterior or anterior cervical chain or axillae bilaterally.  Gait normal with good balance and coordination.  MSK:  Non tender with full range of motion and good stability and symmetric strength and tone of shoulders, elbows, wrist, hip, knee and ankles bilaterally. Arthritic changes of multiple joints  Hand exam shows Shows some mild arthritic changes of multiple joints. Including DIP joints. Patient has severe grinding with a positive grind test of the [facility] with pain. Pain around the joint as well. Negative Finkelstein's. Neurovascularly intact. No thenar eminence wasting. Contralateral hand unremarkable  Foot exam shows breakdown of the longitudinal and transverse arch. Patient is having minor hammering of the first and second toes. Bunion and bunionette formation noted. Patient's does have hallux rigidus on the right toe that is severe.  Limited muscular skeletal ultrasound was performed and interpreted by Lyndal Pulley  Limited ultrasound patient's first toe shows the patient does have severe bone-on-bone osteophytic changes were no significant capsulitis. Patient also had Limited ultrasound the patient's right CMC joint. Seems see joint does have a synovitis noted with hypoechoic changes. Numerous small calcific loose bodies noted. Moderate arthritic changes.  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    Impression and Recommendations:     This case required medical decision making of moderate complexity.      Note: This dictation was prepared with Dragon dictation along with smaller phrase technology. Any transcriptional errors that result from this process are unintentional.

## 2016-01-01 ENCOUNTER — Encounter: Payer: Self-pay | Admitting: Family Medicine

## 2016-01-01 ENCOUNTER — Telehealth: Payer: Self-pay | Admitting: Family Medicine

## 2016-01-01 ENCOUNTER — Ambulatory Visit (INDEPENDENT_AMBULATORY_CARE_PROVIDER_SITE_OTHER): Payer: Medicare Other | Admitting: Family Medicine

## 2016-01-01 ENCOUNTER — Other Ambulatory Visit: Payer: Self-pay

## 2016-01-01 VITALS — BP 110/70 | HR 76 | Ht 69.5 in | Wt 216.0 lb

## 2016-01-01 DIAGNOSIS — M79671 Pain in right foot: Secondary | ICD-10-CM

## 2016-01-01 DIAGNOSIS — M1990 Unspecified osteoarthritis, unspecified site: Secondary | ICD-10-CM

## 2016-01-01 DIAGNOSIS — M19079 Primary osteoarthritis, unspecified ankle and foot: Secondary | ICD-10-CM | POA: Insufficient documentation

## 2016-01-01 DIAGNOSIS — M19049 Primary osteoarthritis, unspecified hand: Secondary | ICD-10-CM

## 2016-01-01 MED ORDER — VITAMIN D (ERGOCALCIFEROL) 1.25 MG (50000 UNIT) PO CAPS: 50000.0000 [IU] | ORAL_CAPSULE | ORAL | 0 refills | Status: DC

## 2016-01-01 NOTE — Telephone Encounter (Signed)
rx sent into pharmacy

## 2016-01-01 NOTE — Telephone Encounter (Signed)
Patient states he just seen Dr. Tamala Julian and there was to be a script for Vit D sent to [facility] on [address].  Patient states this script is not there.  Please follow up in regard.

## 2016-01-01 NOTE — Assessment & Plan Note (Signed)
Severe with with patient may have a loose body. Significant hypoechoic changes. We started him once weekly vitamin D supplementation. We discussed icing regimen. Do not feel that bracing would be beneficial. Topical anti-inflammatories given. If worsening or no significant improvement at follow-up we'll consider injection.

## 2016-01-01 NOTE — Assessment & Plan Note (Signed)
Patient is in severe arthritis of the first MTP. We discussed icing regimen, home exercises, we discussed avoiding certain activities. We discussed proper shoes and over-the-counter orthotics. Given trial topical anti-inflammatories. We discussed monitoring otherwise. Patient come back again in 3-4 weeks. Worsening symptoms we will consider injection.

## 2016-01-01 NOTE — Patient Instructions (Addendum)
Good to see you  Ice 20 minutes 2 times daily. Usually after activity and before bed. For the foot and had try pennsaid pinkie amount topically 2 times daily as needed.  For the foot Spenco orthotics "total support" online would be great  Good shoes with rigid bottom.  Adrian [name], Merrell or New balance greater then [employer] could also be helpful. This will help the large toe NO WALKING BAREFOOT When cycling do not use the strap near the toes.  Once weekly vitamin D for next 12 weeks Over the counter Turmeric 500mg  at leas daily  Tart cherry extract any dose at night See me again in 3 weeks na dif thumb hurts we will consider injection. We can even talk about injecting the toe as well.

## 2016-01-08 ENCOUNTER — Ambulatory Visit: Payer: BLUE CROSS/BLUE SHIELD | Admitting: Family Medicine

## 2016-01-08 DIAGNOSIS — H52203 Unspecified astigmatism, bilateral: Secondary | ICD-10-CM | POA: Diagnosis not present

## 2016-01-08 DIAGNOSIS — H5203 Hypermetropia, bilateral: Secondary | ICD-10-CM | POA: Diagnosis not present

## 2016-01-08 DIAGNOSIS — H2511 Age-related nuclear cataract, right eye: Secondary | ICD-10-CM | POA: Diagnosis not present

## 2016-01-08 DIAGNOSIS — H524 Presbyopia: Secondary | ICD-10-CM | POA: Diagnosis not present

## 2016-01-08 DIAGNOSIS — H2513 Age-related nuclear cataract, bilateral: Secondary | ICD-10-CM | POA: Diagnosis not present

## 2016-01-09 ENCOUNTER — Ambulatory Visit: Payer: BLUE CROSS/BLUE SHIELD | Admitting: Family Medicine

## 2016-01-22 ENCOUNTER — Ambulatory Visit (INDEPENDENT_AMBULATORY_CARE_PROVIDER_SITE_OTHER): Payer: Medicare Other | Admitting: Family Medicine

## 2016-01-22 ENCOUNTER — Encounter: Payer: Self-pay | Admitting: Family Medicine

## 2016-01-22 DIAGNOSIS — M1990 Unspecified osteoarthritis, unspecified site: Secondary | ICD-10-CM

## 2016-01-22 DIAGNOSIS — M19079 Primary osteoarthritis, unspecified ankle and foot: Secondary | ICD-10-CM

## 2016-01-22 DIAGNOSIS — M19049 Primary osteoarthritis, unspecified hand: Secondary | ICD-10-CM

## 2016-01-22 NOTE — Assessment & Plan Note (Signed)
Patient seems to be doing well with shoes, over-the-counter orthotics, icing and topical anti-inflammatory's. No change. Follow-up as needed.

## 2016-01-22 NOTE — Progress Notes (Signed)
[name] [address] [place], [place] [address] Phone: [phone] Subjective:     CC: Right foot pain, right hand pain Follow-up  [id number]  Adrian [name] is a 65 y.o. male coming in with complaint of  Right foot pain.  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 Patient has been and over-the-counter orthotics, custom shoes, as well as been doing icing and topical anti-inflammatory's. Feels that he is doing 80-95% better. Very minimal discomfort. Able to workout on a regular basis as well which is the first time and multiple months.  Also complaining of right hand pain. Found to have [place] arthritis. Patient states that this is also improved. Very slowly not as much as the foot. Patient states though that only gives him pain in certain motions. No pain at night.     Past Medical History:  Diagnosis Date  . Allergic rhinitis   . Cataract   . GERD (gastroesophageal reflux disease)   . Hyperlipidemia   . Hypertension   . Hypothyroidism 2010   mild  . Hypothyroidism   . Trigeminal neuralgia    L.  . Vitamin B12 deficiency 2010   Past Surgical History:  Procedure Laterality Date  . arthroscopic surgery for chondromalacia  1995  . COLONOSCOPY    . gamma knife surgery for trigeminal neuralgia  2005  . inguinal herniorrhahpy  1991   L.  Marland Kitchen POLYPECTOMY    . TONSILLECTOMY  1959  . TRIGEMINAL NERVE DECOMPRESSION  2008   Left  Dr. Claybon Jabs   Social History   Social History  . Marital status: Married    Spouse name: N/A  . Number of children: N/A  . Years of education: N/A   Occupational History  . 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 Smoking status: Former Smoker    Quit date: 05/31/1992  . Smokeless tobacco: Never Used  . Alcohol use 4.2 oz/week    7 Glasses of wine per week  . Drug use: No  . Sexual activity: Yes   Other Topics Concern  . None   Social History Narrative   UCD   Married x2: 12  years 15st; 15 years ('25) 2nd   One daughter, step daugfhter, step son   Likes to bike   Allergies  Allergen Reactions  . Tape Rash    Irritation  . Triple Antibiotic [Bacitracin-Neomycin-Polymyxin] Rash   Family History  Problem Relation Age of Onset  . Diabetes Other     1st degree relative  . Prostate cancer      1st degree relative <50  . Heart disease Mother 52    MI  . Diabetes Mother   . Cancer Father 6    prostate  . Cancer Sister 60    breast ca  . Stroke Brother   . Colon cancer Neg Hx   . Esophageal cancer Neg Hx   . Rectal cancer Neg Hx   . Stomach cancer Neg Hx     Past medical history, social, surgical and family history all reviewed in electronic medical record.  No pertanent information unless stated regarding to the chief complaint.   Review of Systems: No headache, visual changes, nausea, vomiting, diarrhea, constipation, dizziness, abdominal pain, skin rash, fevers, chills, night sweats, weight loss, swollen lymph nodes, body aches, joint swelling, muscle aches, chest pain, shortness of breath, mood changes.   Objective  Blood pressure 120/82, pulse (!) 53,  weight 217 lb (98.4 kg), SpO2 97 %.  General: No apparent distress alert and oriented x3 mood and affect normal, dressed appropriately.  HEENT: Pupils equal, extraocular movements intact  Respiratory: Patient's speak in full sentences and does not appear short of breath  Cardiovascular: No lower extremity edema, non tender, no erythema  Skin: Warm dry intact with no signs of infection or rash on extremities or on axial skeleton.  Abdomen: Soft nontender  Neuro: Cranial nerves II through XII are intact, neurovascularly intact in all extremities with 2+ DTRs and 2+ pulses.  Lymph: No lymphadenopathy of posterior or anterior cervical chain or axillae bilaterally.  Gait normal with good balance and coordination.  MSK:  Non tender with full range of motion and good stability and symmetric strength and  tone of shoulders, elbows, wrist, hip, knee and ankles bilaterally. Arthritic changes of multiple joints  Hand exam shows Shows some mild arthritic changes of multiple joints. Including DIP joints. Still positive CMC grinding but very minimal compared to previous exam.  Foot exam shows breakdown of the longitudinal and transverse arch. Patient is having minor hammering of the first and second toes. Bunion and bunionette formation noted. Hallux rigidus still noted but no pain.     Impression and Recommendations:     This case required medical decision making of moderate complexity.      Note: This dictation was prepared with Dragon dictation along with smaller phrase technology. Any transcriptional errors that result from this process are unintentional.

## 2016-01-22 NOTE — Assessment & Plan Note (Signed)
Continues to give him some discomfort. We'll continue to monitor. If worsening symptoms we can consider injection.

## 2016-02-11 DIAGNOSIS — H2512 Age-related nuclear cataract, left eye: Secondary | ICD-10-CM | POA: Diagnosis not present

## 2016-02-11 DIAGNOSIS — H2511 Age-related nuclear cataract, right eye: Secondary | ICD-10-CM | POA: Diagnosis not present

## 2016-02-18 DIAGNOSIS — H2512 Age-related nuclear cataract, left eye: Secondary | ICD-10-CM | POA: Diagnosis not present

## 2016-02-24 ENCOUNTER — Ambulatory Visit (INDEPENDENT_AMBULATORY_CARE_PROVIDER_SITE_OTHER): Payer: Medicare Other

## 2016-02-24 DIAGNOSIS — Z23 Encounter for immunization: Secondary | ICD-10-CM | POA: Diagnosis not present

## 2016-02-26 ENCOUNTER — Other Ambulatory Visit: Payer: Self-pay | Admitting: *Deleted

## 2016-02-26 ENCOUNTER — Encounter: Payer: Self-pay | Admitting: Nurse Practitioner

## 2016-02-26 ENCOUNTER — Ambulatory Visit (INDEPENDENT_AMBULATORY_CARE_PROVIDER_SITE_OTHER): Payer: Medicare Other | Admitting: Nurse Practitioner

## 2016-02-26 VITALS — BP 142/92 | HR 88 | Temp 98.4°F | Ht 69.5 in | Wt 219.0 lb

## 2016-02-26 DIAGNOSIS — J069 Acute upper respiratory infection, unspecified: Secondary | ICD-10-CM

## 2016-02-26 DIAGNOSIS — J4531 Mild persistent asthma with (acute) exacerbation: Secondary | ICD-10-CM | POA: Diagnosis not present

## 2016-02-26 MED ORDER — IPRATROPIUM BROMIDE 0.03 % NA SOLN: 2.0000 | Freq: Two times a day (BID) | NASAL | 0 refills | Status: DC

## 2016-02-26 MED ORDER — ALBUTEROL SULFATE HFA 108 (90 BASE) MCG/ACT IN AERS: 2.0000 | INHALATION_SPRAY | Freq: Four times a day (QID) | RESPIRATORY_TRACT | 0 refills | Status: DC | PRN

## 2016-02-26 MED ORDER — AMOXICILLIN-POT CLAVULANATE 875-125 MG PO TABS: 1.0000 | ORAL_TABLET | Freq: Two times a day (BID) | ORAL | 0 refills | Status: DC

## 2016-02-26 MED ORDER — GUAIFENESIN ER 600 MG PO TB12: 600.0000 mg | ORAL_TABLET | Freq: Two times a day (BID) | ORAL | 0 refills | Status: DC | PRN

## 2016-02-26 MED ORDER — FLUTICASONE FUROATE-VILANTEROL 100-25 MCG/INH IN AEPB: 1.0000 | INHALATION_SPRAY | Freq: Every day | RESPIRATORY_TRACT | 3 refills | Status: DC

## 2016-02-26 NOTE — Patient Instructions (Signed)
URI Instructions: Use over-the-counter  "cold" medicines  such as "Tylenol cold" , "Advil cold",  "Mucinex" or" Mucinex D"  for cough and congestion.  Avoid decongestants if you have high blood pressure. Use" Delsym" or" Robitussin" cough syrup varietis for cough.  You can use plain "Tylenol" or "Advi"l for fever, chills and achyness.   "Common cold" symptoms are usually triggered by a virus.  The antibiotics are usually not necessary. On average, a" viral cold" illness would take 4-7 days to resolve. Please, make an appointment if you are not better or if you're worse.  Start oral abx if no improvement in 1week.

## 2016-02-26 NOTE — Progress Notes (Unsigned)
Pre visit review using our clinic review tool, if applicable. No additional management support is needed unless otherwise documented below in the visit note. 

## 2016-02-26 NOTE — Progress Notes (Signed)
Subjective:  Patient ID: Adrian [name], male    DOB: [birth year]  Age: 65 y.o. MRN: [id number]  CC: Sinus Problem (Pt stated coughing, chest tightness, sore throat for about 4 days)   Sinus Problem  This is a new problem. The current episode started in the past 7 days. The problem has been gradually worsening since onset. There has been no fever. His pain is at a severity of 4/10. The pain is moderate. Associated symptoms include chills, congestion, coughing, ear pain, headaches, a hoarse voice, sinus pressure, sneezing, a sore throat and swollen glands. Pertinent negatives include no diaphoresis or shortness of breath. Past treatments include oral decongestants and acetaminophen. The treatment provided mild relief.    Outpatient Medications Prior to Visit  Medication Sig Dispense Refill  . acyclovir (ZOVIRAX) 800 MG tablet Take 1 tablet (800 mg total) by mouth 5 (five) times daily. 35 tablet 0  . aspirin 81 MG tablet Take 1 tablet (81 mg total) by mouth at bedtime. 30 tablet 11  . DUREZOL 0.05 % EMUL     . gentamicin ointment (GARAMYCIN) 0.1 % APPLY  THREE TIMES DAILY 30 g 2  . halobetasol (ULTRAVATE) 0.05 % cream APPLY CREAM TOPICALLY TWICE DAILY AS NEEDED 50 g 1  . levothyroxine (SYNTHROID, LEVOTHROID) 75 MCG tablet Take 1 tablet (75 mcg total) by mouth daily. 90 tablet 3  . loratadine (CLARITIN) 10 MG tablet Take 10 mg by mouth daily.      Marland Kitchen losartan (COZAAR) 100 MG tablet Take 0.5 tablets (50 mg total) by mouth daily. 90 tablet 3  . Magnesium 100 MG CAPS Take 1 capsule by mouth daily.    . Melatonin 10 MG TABS Take 1 each by mouth at bedtime.      . meloxicam (MOBIC) 15 MG tablet Take 0.5 tablets (7.5 mg total) by mouth daily as needed for pain. 30 tablet 3  . Multiple Vitamins-Minerals (CENTRUM SILVER ADULT 50+ PO) Take 1 tablet by mouth at bedtime.    Marland Kitchen ofloxacin (OCUFLOX) 0.3 % ophthalmic solution     . ofloxacin (OCUFLOX) 0.3 % ophthalmic solution Instill 1 drop BID in operative  eye starting 2 days prior to surgery and continue 3 weeks after surgery.    . Probiotic Product (PROBIOTIC DAILY PO) Take 1 tablet by mouth at bedtime.    . promethazine-codeine (PHENERGAN WITH CODEINE) 6.25-10 MG/5ML syrup TAKE 5 ML BY MOUTH EVERY 4 HOURS AS NEEDED 300 mL 0  . ranitidine (ZANTAC) 150 MG tablet Take 150 mg by mouth at bedtime.     . traZODone (DESYREL) 100 MG tablet Take 0.5 tablets (50 mg total) by mouth at bedtime. 90 tablet 3  . VALERIAN ROOT PO Take 1,500 mg by mouth at bedtime.    . Vitamin D, Ergocalciferol, (DRISDOL) 50000 units CAPS capsule Take 1 capsule (50,000 Units total) by mouth every 7 (seven) days. 12 capsule 0  . Zinc 50 MG TABS Take 1 tablet by mouth every morning.      . Fluticasone Furoate-Vilanterol (BREO ELLIPTA) 100-25 MCG/INH AEPB Inhale 1 Act into the lungs daily. 1 each 3   No facility-administered medications prior to visit.     ROS See HPI  Objective:  BP (!) 142/92 (BP Location: Left Arm, Patient Position: Sitting, Cuff Size: Normal)   Pulse 88   Temp 98.4 F (36.9 C)   Ht 5' 9.5" (1.765 m)   Wt 219 lb (99.3 kg)   SpO2 97%   BMI 31.88 kg/m  BP Readings from Last 3 Encounters:  02/26/16 (!) 142/92  01/22/16 120/82  01/01/16 110/70    Wt Readings from Last 3 Encounters:  02/26/16 219 lb (99.3 kg)  01/22/16 217 lb (98.4 kg)  01/01/16 216 lb (98 kg)    Physical Exam  Constitutional: He is oriented to person, place, and time. No distress.  HENT:  Right Ear: No mastoid tenderness. Tympanic membrane is erythematous. A middle ear effusion is present.  Left Ear: No mastoid tenderness. Tympanic membrane is erythematous. A middle ear effusion is present.  Nose: Mucosal edema and rhinorrhea present.  Mouth/Throat: Posterior oropharyngeal erythema present. No oropharyngeal exudate.  Eyes: No scleral icterus.  Neck: Normal range of motion. Neck supple.  Cardiovascular: Normal rate, regular rhythm and normal heart sounds.     Pulmonary/Chest: Effort normal and breath sounds normal.  Musculoskeletal: He exhibits no edema.  Neurological: He is alert and oriented to person, place, and time.  Vitals reviewed.   Lab Results  Component Value Date   WBC 6.8 04/10/2015   HGB 14.7 04/10/2015   HCT 43.4 04/10/2015   PLT 150.0 04/10/2015   GLUCOSE 112 (H) 04/10/2015   CHOL 134 04/10/2015   TRIG 86.0 04/10/2015   HDL 40.20 04/10/2015   LDLCALC 77 04/10/2015   ALT 17 04/10/2015   AST 12 04/10/2015   NA 140 04/10/2015   K 4.7 04/10/2015   CL 103 04/10/2015   CREATININE 1.77 (H) 04/10/2015   BUN 23 04/10/2015   CO2 28 04/10/2015   TSH 2.51 04/10/2015   PSA 0.71 04/10/2015   INR 1.0 RATIO 09/18/2007   HGBA1C 5.6 12/04/2008    Dg Chest 2 View  Result Date: 10/02/2013 CLINICAL DATA:  Cough and congestion EXAM: CHEST  2 VIEW COMPARISON:  None. FINDINGS: Cardiac shadow is within normal limits. The lungs are well aerated without focal infiltrate or sizable effusion. A few tiny calcified granulomas are seen. No acute bony abnormality is noted. IMPRESSION: No active cardiopulmonary disease. Electronically Signed   By: Inez Catalina M.D.   On: 10/02/2013 16:57    Assessment & Plan:   Adrian [name] was seen today for sinus problem.  Diagnoses and all orders for this visit:  URI, acute -     amoxicillin-clavulanate (AUGMENTIN) 875-125 MG tablet; Take 1 tablet by mouth 2 (two) times daily. -     guaiFENesin (MUCINEX) 600 MG 12 hr tablet; Take 1 tablet (600 mg total) by mouth 2 (two) times daily as needed for cough or to loosen phlegm. -     ipratropium (ATROVENT) 0.03 % nasal spray; Place 2 sprays into both nostrils every 12 (twelve) hours. Do not use more than 4days  Asthmatic bronchitis, mild persistent, with acute exacerbation -     fluticasone furoate-vilanterol (BREO ELLIPTA) 100-25 MCG/INH AEPB; Inhale 1 puff into the lungs daily. -     amoxicillin-clavulanate (AUGMENTIN) 875-125 MG tablet; Take 1 tablet by mouth 2  (two) times daily.   I have changed Adrian [name]'s fluticasone furoate-vilanterol. I am also having him start on amoxicillin-clavulanate, guaiFENesin, and ipratropium. Additionally, I am having him maintain his loratadine, ranitidine, Melatonin, Zinc, Multiple Vitamins-Minerals (CENTRUM SILVER ADULT 50+ PO), Probiotic Product (PROBIOTIC DAILY PO), VALERIAN ROOT PO, aspirin, Magnesium, levothyroxine, losartan, traZODone, halobetasol, acyclovir, meloxicam, promethazine-codeine, gentamicin ointment, Vitamin D (Ergocalciferol), DUREZOL, ofloxacin, and ofloxacin.  Meds ordered this encounter  Medications  . fluticasone furoate-vilanterol (BREO ELLIPTA) 100-25 MCG/INH AEPB    Sig: Inhale 1 puff into the lungs daily.  Dispense:  1 each    Refill:  3    Order Specific Question:   Supervising Provider    Answer:   Cassandria Anger [[id number]]  . amoxicillin-clavulanate (AUGMENTIN) 875-125 MG tablet    Sig: Take 1 tablet by mouth 2 (two) times daily.    Dispense:  20 tablet    Refill:  0    Order Specific Question:   Supervising Provider    Answer:   Cassandria Anger [[id number]]  . guaiFENesin (MUCINEX) 600 MG 12 hr tablet    Sig: Take 1 tablet (600 mg total) by mouth 2 (two) times daily as needed for cough or to loosen phlegm.    Dispense:  14 tablet    Refill:  0    Order Specific Question:   Supervising Provider    Answer:   Cassandria Anger [[id number]]  . ipratropium (ATROVENT) 0.03 % nasal spray    Sig: Place 2 sprays into both nostrils every 12 (twelve) hours. Do not use more than 4days    Dispense:  30 mL    Refill:  0    Order Specific Question:   Supervising Provider    Answer:   Cassandria Anger [[id number]]    Follow-up: Return if symptoms worsen or fail to improve.  Wilfred Lacy, NP

## 2016-02-26 NOTE — Progress Notes (Signed)
Pre visit review using our clinic review tool, if applicable. No additional management support is needed unless otherwise documented below in the visit note. 

## 2016-03-01 ENCOUNTER — Ambulatory Visit (INDEPENDENT_AMBULATORY_CARE_PROVIDER_SITE_OTHER): Payer: Medicare Other | Admitting: Podiatry

## 2016-03-01 ENCOUNTER — Encounter: Payer: Self-pay | Admitting: Podiatry

## 2016-03-01 VITALS — BP 148/86 | HR 53 | Resp 16 | Ht 70.0 in | Wt 215.0 lb

## 2016-03-01 DIAGNOSIS — L600 Ingrowing nail: Secondary | ICD-10-CM

## 2016-03-01 NOTE — Progress Notes (Signed)
Subjective:     Patient ID: Adrian [name], male   DOB: March 23, 1951, 65 y.o.   MRN: [id number]  HPI patient presents stating that I have several nails that are really bothering me and making it hard to wear shoe gear and I'm due to go to [place] in 2 weeks and I need to have these taken care of   Review of Systems  All other systems reviewed and are negative.      Objective:   Physical Exam  Constitutional: He is oriented to person, place, and time.  Cardiovascular: Intact distal pulses.   Musculoskeletal: Normal range of motion.  Neurological: He is oriented to person, place, and time.  Skin: Skin is warm.  Nursing note and vitals reviewed.  neurovascular status intact muscle strength adequate range of motion within normal limits with patient found to have incurvated damaged hallux nail right and incurvated second nail right that's thick and painful and also on the left big toenail incurvation of the corners. Patient's found have good digital perfusion is well oriented 3     Assessment:     Ingrown toenail deformity with damage to the right hallux second nails with incurvated nailbed left hallux medial lateral border that's not as tender but present    Plan:     H&P conditions reviewed and recommended nail removal. Focus on the right foot today and I removed spicules on the right big toenail after anesthetizing the 60 [place] times like Marcaine mixture and the second nail was removed entirely. I did explain risk prior to procedure and I utilized phenol on base followed by alcohol lavage and sterile dressing. Gave instructions on soaks and reappoint

## 2016-03-01 NOTE — Patient Instructions (Signed)

## 2016-03-01 NOTE — Progress Notes (Signed)
   Subjective:    Patient ID: Adrian [name], male    DOB: July 11, 1950, 65 y.o.   MRN: [id number]  HPI Chief Complaint  Patient presents with  . Nail Problem    Bilateral; great toes; Right foot-2nd toe; pt stated, "wants those nails removed today; going on a trip on 03/12/16 to [place] and will be driving"      Review of Systems  All other systems reviewed and are negative.      Objective:   Physical Exam        Assessment & Plan:

## 2016-03-09 ENCOUNTER — Other Ambulatory Visit: Payer: Self-pay

## 2016-03-09 ENCOUNTER — Ambulatory Visit (INDEPENDENT_AMBULATORY_CARE_PROVIDER_SITE_OTHER): Payer: Medicare Other | Admitting: Family Medicine

## 2016-03-09 ENCOUNTER — Encounter: Payer: Self-pay | Admitting: Family Medicine

## 2016-03-09 VITALS — BP 122/82 | HR 56 | Wt 215.0 lb

## 2016-03-09 DIAGNOSIS — M19079 Primary osteoarthritis, unspecified ankle and foot: Secondary | ICD-10-CM | POA: Diagnosis not present

## 2016-03-09 DIAGNOSIS — M79674 Pain in right toe(s): Secondary | ICD-10-CM | POA: Diagnosis not present

## 2016-03-09 MED ORDER — VITAMIN D (ERGOCALCIFEROL) 1.25 MG (50000 UNIT) PO CAPS: 50000.0000 [IU] | ORAL_CAPSULE | ORAL | 0 refills | Status: DC

## 2016-03-09 NOTE — Progress Notes (Signed)
[name] [address] [place], [place] [address] Phone: [phone] Subjective:     CC: Right foot pain  [id number]  Adrian [name] is a 65 y.o. male coming in with complaint of  Right foot pain.  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 Was doing well had Toenail removed and unfortunately is having worsening pain. Patient states that having difficulty even moving the toe regular basis. States that can't bend it up at all. Has a trip coming up and wants to be able to walk long distances.     Past Medical History:  Diagnosis Date  . Allergic rhinitis   . Cataract   . GERD (gastroesophageal reflux disease)   . Hyperlipidemia   . Hypertension   . Hypothyroidism 2010   mild  . Hypothyroidism   . Trigeminal neuralgia    L.  . Vitamin B12 deficiency 2010   Past Surgical History:  Procedure Laterality Date  . arthroscopic surgery for chondromalacia  1995  . COLONOSCOPY    . gamma knife surgery for trigeminal neuralgia  2005  . inguinal herniorrhahpy  1991   L.  Marland Kitchen POLYPECTOMY    . TONSILLECTOMY  1959  . TRIGEMINAL NERVE DECOMPRESSION  2008   Left  Dr. Claybon Jabs   Social History   Social History  . Marital status: Married    Spouse name: N/A  . Number of children: N/A  . Years of education: N/A   Occupational History  . 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 Smoking status: Former Smoker    Quit date: 05/31/1992  . Smokeless tobacco: Never Used  . Alcohol use 4.2 oz/week    7 Glasses of wine per week  . Drug use: No  . Sexual activity: Yes   Other Topics Concern  . None   Social History Narrative   UCD   Married x2: 12 years 44st; 15 years ('23) 2nd   One daughter, step daugfhter, step son   Likes to bike   Allergies  Allergen Reactions  . Tape Rash    Irritation  . Triple Antibiotic [Bacitracin-Neomycin-Polymyxin] Rash   Family History  Problem Relation Age of Onset  . Heart disease  Mother 32    MI  . Diabetes Mother   . Cancer Father 83    prostate  . Cancer Sister 9    breast ca  . Stroke Brother   . Diabetes Other     1st degree relative  . Prostate cancer      1st degree relative <50  . Colon cancer Neg Hx   . Esophageal cancer Neg Hx   . Rectal cancer Neg Hx   . Stomach cancer Neg Hx     Past medical history, social, surgical and family history all reviewed in electronic medical record.  No pertanent information unless stated regarding to the chief complaint.   Review of Systems: No headache, visual changes, nausea, vomiting, diarrhea, constipation, dizziness, abdominal pain, skin rash, fevers, chills, night sweats, weight loss, swollen lymph nodes, body aches, joint swelling, muscle aches, chest pain, shortness of breath, mood changes.   Objective  Blood pressure 122/82, pulse (!) 56, weight 215 lb (97.5 kg), SpO2 97 %.  General: No apparent distress alert and oriented x3 mood and affect normal, dressed appropriately.  HEENT: Pupils equal, extraocular movements intact  Respiratory: Patient's speak in full sentences and does not appear short of breath  Cardiovascular: No lower extremity edema, non tender, no erythema  Skin: Warm dry intact with no signs of infection or rash on extremities or on axial skeleton.  Abdomen: Soft nontender  Neuro: Cranial nerves II through XII are intact, neurovascularly intact in all extremities with 2+ DTRs and 2+ pulses.  Lymph: No lymphadenopathy of posterior or anterior cervical chain or axillae bilaterally.  Gait normal with good balance and coordination.  MSK:  Non tender with full range of motion and good stability and symmetric strength and tone of shoulders, elbows, wrist, hip, knee and ankles bilaterally. Arthritic changes of multiple joints   Foot exam shows breakdown of the longitudinal and transverse arch. Patient is having minor hammering of the first and second toes. Bunion and bunionette formation noted.  Hallux rigidus With severe discomfort with any type of dorsiflexion. Mild swelling noted. Patient has had removal of the toenail of the first toe.   Procedure: Real-time Ultrasound Guided Injection of right first metatarsal joint Device: GE Logiq E  Ultrasound guided injection is preferred based studies that show increased duration, increased effect, greater accuracy, decreased procedural pain, increased response rate, and decreased cost with ultrasound guided versus blind injection.  Verbal informed consent obtained.  Time-out conducted.  Noted no overlying erythema, induration, or other signs of local infection.  Skin prepped in a sterile fashion.  Local anesthesia: Topical Ethyl chloride.  With sterile technique and under real time ultrasound guidance:  With a 25-gauge half-inch needle patient was injected with a total of 0.5 mL of 0.5% Marcaine and 0.5 mL of Kenalog 40 mg/dL. Completed without difficulty  Pain immediately resolved suggesting accurate placement of the medication.  Advised to call if fevers/chills, erythema, induration, drainage, or persistent bleeding.  Images permanently stored and available for review in the ultrasound unit.  Impression: Technically successful ultrasound guided injection.      Impression and Recommendations:     This case required medical decision making of moderate complexity.      Note: This dictation was prepared with Dragon dictation along with smaller phrase technology. Any transcriptional errors that result from this process are unintentional.

## 2016-03-09 NOTE — Assessment & Plan Note (Signed)
Worsening symptoms. Given injection today and tolerated the procedure well. We discussed topical anti-inflammatories and icing protocol. We discussed proper shoes. We discussed we can repeat this every 3 months if necessary. Patient will otherwise follow-up with me again in 4-6 weeks.

## 2016-03-09 NOTE — Patient Instructions (Signed)
Good to see you  Injected the toe.  Have a great trip  pennsaid pinkie amount topically 2 times daily as needed.  Ice is your friend if you need it Refilled vitamin D See me again in 4 weeks if not better.

## 2016-03-12 ENCOUNTER — Encounter: Payer: Self-pay | Admitting: Family Medicine

## 2016-03-18 ENCOUNTER — Other Ambulatory Visit: Payer: Medicare Other

## 2016-03-18 DIAGNOSIS — M79674 Pain in right toe(s): Secondary | ICD-10-CM

## 2016-04-01 ENCOUNTER — Telehealth: Payer: Self-pay | Admitting: *Deleted

## 2016-04-01 NOTE — Telephone Encounter (Signed)
Left message for patient at [phone] (Home #) to check to see how they were doing from when they got their nail removed on Monday, March 01, 2016. Waiting for a response.

## 2016-04-02 ENCOUNTER — Encounter: Payer: Self-pay | Admitting: Gastroenterology

## 2016-04-05 ENCOUNTER — Encounter: Payer: Self-pay | Admitting: Gastroenterology

## 2016-04-14 ENCOUNTER — Encounter: Payer: BLUE CROSS/BLUE SHIELD | Admitting: Internal Medicine

## 2016-04-15 ENCOUNTER — Ambulatory Visit: Payer: Medicare Other | Admitting: Podiatry

## 2016-04-26 ENCOUNTER — Encounter: Payer: Self-pay | Admitting: Podiatry

## 2016-04-26 ENCOUNTER — Ambulatory Visit (INDEPENDENT_AMBULATORY_CARE_PROVIDER_SITE_OTHER): Payer: Medicare Other | Admitting: Podiatry

## 2016-04-26 VITALS — BP 132/80 | HR 51 | Resp 16

## 2016-04-26 DIAGNOSIS — L600 Ingrowing nail: Secondary | ICD-10-CM

## 2016-04-26 NOTE — Progress Notes (Signed)
Subjective:     Patient ID: Adrian [name], male   DOB: Apr 21, 1951, 65 y.o.   MRN: [id number]  HPI patient presents with painful incurvated left hallux nail that's thickening and hard for him to wear shoe gear with and wants it removed   Review of Systems     Objective:   Physical Exam  Neurovascular status intact muscle strength adequate with damaged left hallux nailbed that's loose and painful when pressed from a dorsal direction    Assessment:     Chronic damaged the left hallux nail    Plan:     Reviewed condition and discussed treatment options. Due to long-standing length of time and pain nail removal is recommended patient wants this done and understands permanent and understands risk. Today I infiltrated the left hallux 60 mg Xylocaine Marcaine mixture remove the hallux nail exposed matrix and applied phenol 5 applications 30 seconds followed by alcohol lavage and sterile dressing. Gave instructions on soaks and reappoint

## 2016-04-26 NOTE — Patient Instructions (Signed)

## 2016-05-04 ENCOUNTER — Ambulatory Visit (INDEPENDENT_AMBULATORY_CARE_PROVIDER_SITE_OTHER): Payer: Medicare Other | Admitting: Internal Medicine

## 2016-05-04 ENCOUNTER — Encounter: Payer: Self-pay | Admitting: Internal Medicine

## 2016-05-04 VITALS — BP 132/80 | HR 60 | Ht 70.0 in | Wt 223.0 lb

## 2016-05-04 DIAGNOSIS — I10 Essential (primary) hypertension: Secondary | ICD-10-CM | POA: Diagnosis not present

## 2016-05-04 DIAGNOSIS — N320 Bladder-neck obstruction: Secondary | ICD-10-CM | POA: Diagnosis not present

## 2016-05-04 DIAGNOSIS — R7989 Other specified abnormal findings of blood chemistry: Secondary | ICD-10-CM

## 2016-05-04 DIAGNOSIS — Z0000 Encounter for general adult medical examination without abnormal findings: Secondary | ICD-10-CM

## 2016-05-04 DIAGNOSIS — E538 Deficiency of other specified B group vitamins: Secondary | ICD-10-CM | POA: Diagnosis not present

## 2016-05-04 DIAGNOSIS — E034 Atrophy of thyroid (acquired): Secondary | ICD-10-CM | POA: Diagnosis not present

## 2016-05-04 MED ORDER — LEVOTHYROXINE SODIUM 75 MCG PO TABS: 75.0000 ug | ORAL_TABLET | Freq: Every day | ORAL | 3 refills | Status: DC

## 2016-05-04 MED ORDER — TRAZODONE HCL 100 MG PO TABS: 50.0000 mg | ORAL_TABLET | Freq: Every day | ORAL | 3 refills | Status: DC

## 2016-05-04 MED ORDER — LOSARTAN POTASSIUM 100 MG PO TABS: 50.0000 mg | ORAL_TABLET | Freq: Every day | ORAL | 3 refills | Status: DC

## 2016-05-04 NOTE — Assessment & Plan Note (Signed)
Here for medicare wellness/physical  Diet: heart healthy  Physical activity: not sedentary  Depression/mood screen: negative  Hearing: intact to whispered voice  Visual acuity: grossly normal, performs annual eye exam - just had a cataract surgery ADLs: capable  Fall risk: low to none  Home safety: good  Cognitive evaluation: intact to orientation, naming, recall and repetition  EOL planning: adv directives, full code/ I agree  I have personally reviewed and have noted  1. The patient's medical, surgical and social history  2. Their use of alcohol, tobacco or illicit drugs  3. Their current medications and supplements  4. The patient's functional ability including ADL's, fall risks, home safety risks and hearing or visual impairment.  5. Diet and physical activities  6. Evidence for depression or mood disorders 7. The roster of all physicians providing medical care to patient - is listed in the Snapshot section of the chart and reviewed today.    Today patient counseled on age appropriate routine health concerns for screening and prevention, each reviewed and up to date or declined. Immunizations reviewed and up to date or declined. Labs ordered and reviewed. Risk factors for depression reviewed and negative. Hearing function and visual acuity are intact. ADLs screened and addressed as needed. Functional ability and level of safety reviewed and appropriate. Education, counseling and referrals performed based on assessed risks today. Patient provided with a copy of personalized plan for preventive services.

## 2016-05-04 NOTE — Assessment & Plan Note (Signed)
On Levothroid Labs 

## 2016-05-04 NOTE — Progress Notes (Signed)
Subjective:  Patient ID: Adrian [name], male    DOB: December 17, 1950  Age: 65 y.o. MRN: [id number]  CC: No chief complaint on file.   HPI Adrian [name] presents for a well exam  Outpatient Medications Prior to Visit  Medication Sig Dispense Refill  . acyclovir (ZOVIRAX) 800 MG tablet Take 1 tablet (800 mg total) by mouth 5 (five) times daily. 35 tablet 0  . albuterol (PROVENTIL HFA;VENTOLIN HFA) 108 (90 Base) MCG/ACT inhaler Inhale 2 puffs into the lungs every 6 (six) hours as needed for wheezing or shortness of breath. 1 Inhaler 0  . aspirin 81 MG tablet Take 1 tablet (81 mg total) by mouth at bedtime. 30 tablet 11  . DUREZOL 0.05 % EMUL     . fluticasone furoate-vilanterol (BREO ELLIPTA) 100-25 MCG/INH AEPB Inhale 1 puff into the lungs daily. 1 each 3  . gentamicin ointment (GARAMYCIN) 0.1 % APPLY  THREE TIMES DAILY 30 g 2  . guaiFENesin (MUCINEX) 600 MG 12 hr tablet Take 1 tablet (600 mg total) by mouth 2 (two) times daily as needed for cough or to loosen phlegm. 14 tablet 0  . halobetasol (ULTRAVATE) 0.05 % cream APPLY CREAM TOPICALLY TWICE DAILY AS NEEDED 50 g 1  . ipratropium (ATROVENT) 0.03 % nasal spray Place 2 sprays into both nostrils every 12 (twelve) hours. Do not use more than 4days 30 mL 0  . levothyroxine (SYNTHROID, LEVOTHROID) 75 MCG tablet Take 1 tablet (75 mcg total) by mouth daily. 90 tablet 3  . loratadine (CLARITIN) 10 MG tablet Take 10 mg by mouth daily.      Marland Kitchen losartan (COZAAR) 100 MG tablet Take 0.5 tablets (50 mg total) by mouth daily. 90 tablet 3  . Magnesium 100 MG CAPS Take 1 capsule by mouth daily.    . Melatonin 10 MG TABS Take 1 each by mouth at bedtime.      . meloxicam (MOBIC) 15 MG tablet Take 0.5 tablets (7.5 mg total) by mouth daily as needed for pain. 30 tablet 3  . Multiple Vitamins-Minerals (CENTRUM SILVER ADULT 50+ PO) Take 1 tablet by mouth at bedtime.    Marland Kitchen ofloxacin (OCUFLOX) 0.3 % ophthalmic solution Instill 1 drop BID in operative eye starting 2  days prior to surgery and continue 3 weeks after surgery.    . Probiotic Product (PROBIOTIC DAILY PO) Take 1 tablet by mouth at bedtime.    . ranitidine (ZANTAC) 150 MG tablet Take 150 mg by mouth at bedtime.     . traZODone (DESYREL) 100 MG tablet Take 0.5 tablets (50 mg total) by mouth at bedtime. 90 tablet 3  . VALERIAN ROOT PO Take 1,500 mg by mouth at bedtime.    . Vitamin D, Ergocalciferol, (DRISDOL) 50000 units CAPS capsule Take 1 capsule (50,000 Units total) by mouth every 7 (seven) days. 12 capsule 0  . Zinc 50 MG TABS Take 1 tablet by mouth every morning.       No facility-administered medications prior to visit.     ROS Review of Systems  Constitutional: Negative for appetite change, fatigue and unexpected weight change.  HENT: Negative for congestion, nosebleeds, sneezing, sore throat and trouble swallowing.   Eyes: Negative for itching and visual disturbance.  Respiratory: Negative for cough.   Cardiovascular: Negative for chest pain, palpitations and leg swelling.  Gastrointestinal: Negative for abdominal distention, blood in stool, diarrhea and nausea.  Genitourinary: Negative for frequency and hematuria.  Musculoskeletal: Negative for back pain, gait problem, joint  swelling and neck pain.  Skin: Negative for rash.  Neurological: Negative for dizziness, tremors, speech difficulty and weakness.  Psychiatric/Behavioral: Negative for agitation, dysphoric mood and sleep disturbance. The patient is not nervous/anxious.     Objective:  BP 132/80   Pulse 60   Ht 5\' 10"  (1.778 m)   Wt 223 lb (101.2 kg)   SpO2 97%   BMI 32.00 kg/m   BP Readings from Last 3 Encounters:  05/04/16 132/80  04/26/16 132/80  03/09/16 122/82    Wt Readings from Last 3 Encounters:  05/04/16 223 lb (101.2 kg)  03/09/16 215 lb (97.5 kg)  03/01/16 215 lb (97.5 kg)    Physical Exam  Constitutional: He is oriented to person, place, and time. He appears well-developed and well-nourished. No  distress.  HENT:  Head: Normocephalic and atraumatic.  Right Ear: External ear normal.  Left Ear: External ear normal.  Nose: Nose normal.  Mouth/Throat: Oropharynx is clear and moist. No oropharyngeal exudate.  Eyes: Conjunctivae and EOM are normal. Pupils are equal, round, and reactive to light. Right eye exhibits no discharge. Left eye exhibits no discharge. No scleral icterus.  Neck: Normal range of motion. Neck supple. No JVD present. No tracheal deviation present. No thyromegaly present.  Cardiovascular: Normal rate, regular rhythm, normal heart sounds and intact distal pulses.  Exam reveals no gallop and no friction rub.   No murmur heard. Pulmonary/Chest: Effort normal and breath sounds normal. No stridor. No respiratory distress. He has no wheezes. He has no rales. He exhibits no tenderness.  Abdominal: Soft. Bowel sounds are normal. He exhibits no distension and no mass. There is no tenderness. There is no rebound and no guarding.  Genitourinary: Rectum normal, prostate normal and penis normal. Rectal exam shows guaiac negative stool. No penile tenderness.  Musculoskeletal: Normal range of motion. He exhibits no edema or tenderness.  Lymphadenopathy:    He has no cervical adenopathy.  Neurological: He is alert and oriented to person, place, and time. He has normal reflexes. No cranial nerve deficit. He exhibits normal muscle tone. Coordination normal.  Skin: Skin is warm and dry. No rash noted. He is not diaphoretic. No erythema. No pallor.  Psychiatric: He has a normal mood and affect. His behavior is normal. Judgment and thought content normal.  Obese  Lab Results  Component Value Date   WBC 6.8 04/10/2015   HGB 14.7 04/10/2015   HCT 43.4 04/10/2015   PLT 150.0 04/10/2015   GLUCOSE 112 (H) 04/10/2015   CHOL 134 04/10/2015   TRIG 86.0 04/10/2015   HDL 40.20 04/10/2015   LDLCALC 77 04/10/2015   ALT 17 04/10/2015   AST 12 04/10/2015   NA 140 04/10/2015   K 4.7 04/10/2015     CL 103 04/10/2015   CREATININE 1.77 (H) 04/10/2015   BUN 23 04/10/2015   CO2 28 04/10/2015   TSH 2.51 04/10/2015   PSA 0.71 04/10/2015   INR 1.0 RATIO 09/18/2007   HGBA1C 5.6 12/04/2008    Dg Chest 2 View  Result Date: 10/02/2013 CLINICAL DATA:  Cough and congestion EXAM: CHEST  2 VIEW COMPARISON:  None. FINDINGS: Cardiac shadow is within normal limits. The lungs are well aerated without focal infiltrate or sizable effusion. A few tiny calcified granulomas are seen. No acute bony abnormality is noted. IMPRESSION: No active cardiopulmonary disease. Electronically Signed   By: Inez Catalina M.D.   On: 10/02/2013 16:57    Assessment & Plan:   There are no diagnoses  linked to this encounter. I am having Mr. Rule maintain his loratadine, ranitidine, Melatonin, Zinc, Multiple Vitamins-Minerals (CENTRUM SILVER ADULT 50+ PO), Probiotic Product (PROBIOTIC DAILY PO), VALERIAN ROOT PO, aspirin, Magnesium, levothyroxine, losartan, traZODone, halobetasol, acyclovir, meloxicam, gentamicin ointment, DUREZOL, ofloxacin, fluticasone furoate-vilanterol, guaiFENesin, ipratropium, albuterol, and Vitamin D (Ergocalciferol).  No orders of the defined types were placed in this encounter.    Follow-up: No Follow-up on file.  Walker Kehr, MD

## 2016-05-04 NOTE — Patient Instructions (Signed)

## 2016-05-04 NOTE — Progress Notes (Signed)
Pre visit review using our clinic review tool, if applicable. No additional management support is needed unless otherwise documented below in the visit note. 

## 2016-05-04 NOTE — Assessment & Plan Note (Signed)
CBC

## 2016-05-04 NOTE — Assessment & Plan Note (Signed)
On B12 

## 2016-05-04 NOTE — Assessment & Plan Note (Signed)
Losartan 

## 2016-05-10 ENCOUNTER — Other Ambulatory Visit (INDEPENDENT_AMBULATORY_CARE_PROVIDER_SITE_OTHER): Payer: Medicare Other

## 2016-05-10 DIAGNOSIS — R7989 Other specified abnormal findings of blood chemistry: Secondary | ICD-10-CM | POA: Diagnosis not present

## 2016-05-10 DIAGNOSIS — E034 Atrophy of thyroid (acquired): Secondary | ICD-10-CM | POA: Diagnosis not present

## 2016-05-10 DIAGNOSIS — Z0000 Encounter for general adult medical examination without abnormal findings: Secondary | ICD-10-CM | POA: Diagnosis not present

## 2016-05-10 DIAGNOSIS — E538 Deficiency of other specified B group vitamins: Secondary | ICD-10-CM

## 2016-05-10 DIAGNOSIS — N320 Bladder-neck obstruction: Secondary | ICD-10-CM

## 2016-05-10 DIAGNOSIS — Z79899 Other long term (current) drug therapy: Secondary | ICD-10-CM | POA: Diagnosis not present

## 2016-05-10 LAB — CBC WITH DIFFERENTIAL/PLATELET
BASOS ABS: 0 10*3/uL (ref 0.0–0.1)
BASOS PCT: 0.6 % (ref 0.0–3.0)
EOS ABS: 0.3 10*3/uL (ref 0.0–0.7)
Eosinophils Relative: 4.7 % (ref 0.0–5.0)
HEMATOCRIT: 41.9 % (ref 39.0–52.0)
Hemoglobin: 14.4 g/dL (ref 13.0–17.0)
LYMPHS ABS: 2.4 10*3/uL (ref 0.7–4.0)
LYMPHS PCT: 42.5 % (ref 12.0–46.0)
MCHC: 34.2 g/dL (ref 30.0–36.0)
MCV: 88.3 fl (ref 78.0–100.0)
MONO ABS: 0.6 10*3/uL (ref 0.1–1.0)
Monocytes Relative: 10.4 % (ref 3.0–12.0)
NEUTROS ABS: 2.4 10*3/uL (ref 1.4–7.7)
NEUTROS PCT: 41.8 % — AB (ref 43.0–77.0)
PLATELETS: 144 10*3/uL — AB (ref 150.0–400.0)
RBC: 4.75 Mil/uL (ref 4.22–5.81)
RDW: 13.4 % (ref 11.5–15.5)
WBC: 5.7 10*3/uL (ref 4.0–10.5)

## 2016-05-10 LAB — BASIC METABOLIC PANEL
BUN: 23 mg/dL (ref 6–23)
CALCIUM: 9.3 mg/dL (ref 8.4–10.5)
CHLORIDE: 102 meq/L (ref 96–112)
CO2: 30 meq/L (ref 19–32)
CREATININE: 1.53 mg/dL — AB (ref 0.40–1.50)
GFR: 48.74 mL/min — ABNORMAL LOW (ref >60.00)
GLUCOSE: 98 mg/dL (ref 70–99)
Potassium: 4.8 mEq/L (ref 3.5–5.1)
Sodium: 141 mEq/L (ref 135–145)

## 2016-05-10 LAB — HEPATIC FUNCTION PANEL
ALK PHOS: 41 U/L (ref 39–117)
ALT: 22 U/L (ref 0–53)
AST: 15 U/L (ref 0–37)
Albumin: 4.3 g/dL (ref 3.5–5.2)
BILIRUBIN DIRECT: 0.1 mg/dL (ref 0.0–0.3)
BILIRUBIN TOTAL: 0.4 mg/dL (ref 0.2–1.2)
Total Protein: 6.6 g/dL (ref 6.0–8.3)

## 2016-05-10 LAB — URINALYSIS
BILIRUBIN URINE: NEGATIVE
Hgb urine dipstick: NEGATIVE
KETONES UR: NEGATIVE
LEUKOCYTES UA: NEGATIVE
NITRITE: NEGATIVE
PH: 7 (ref 5.0–8.0)
SPECIFIC GRAVITY, URINE: 1.01 (ref 1.000–1.030)
Total Protein, Urine: NEGATIVE
UROBILINOGEN UA: 0.2 (ref 0.0–1.0)
Urine Glucose: NEGATIVE

## 2016-05-10 LAB — TSH: TSH: 4.06 u[IU]/mL (ref 0.35–4.50)

## 2016-05-10 LAB — LDL CHOLESTEROL, DIRECT: Direct LDL: 66 mg/dL

## 2016-05-10 LAB — LIPID PANEL
Cholesterol: 174 mg/dL (ref 0–200)
HDL: 40.2 mg/dL (ref >39.00)
NONHDL: 133.56
TRIGLYCERIDES: 252 mg/dL — AB (ref 0.0–149.0)
Total CHOL/HDL Ratio: 4
VLDL: 50.4 mg/dL — ABNORMAL HIGH (ref 0.0–40.0)

## 2016-05-10 LAB — PSA: PSA: 0.7 ng/mL (ref 0.10–4.00)

## 2016-05-11 ENCOUNTER — Other Ambulatory Visit: Payer: Self-pay | Admitting: Internal Medicine

## 2016-05-11 ENCOUNTER — Telehealth: Payer: Self-pay | Admitting: *Deleted

## 2016-05-11 DIAGNOSIS — E785 Hyperlipidemia, unspecified: Secondary | ICD-10-CM

## 2016-05-11 LAB — HEPATITIS C ANTIBODY: HCV Ab: NEGATIVE

## 2016-05-11 NOTE — Telephone Encounter (Signed)
Called patient at [phone] (Home #) to check to see how they were doing from when they got their nail removed on Monday, April 26, 2016. Pt stated, "Doing great and not in any pan".

## 2016-05-12 ENCOUNTER — Ambulatory Visit (AMBULATORY_SURGERY_CENTER): Payer: Self-pay | Admitting: *Deleted

## 2016-05-12 VITALS — Ht 70.0 in | Wt 225.8 lb

## 2016-05-12 DIAGNOSIS — Z86010 Personal history of colonic polyps: Secondary | ICD-10-CM

## 2016-05-12 MED ORDER — NA SULFATE-K SULFATE-MG SULF 17.5-3.13-1.6 GM/177ML PO SOLN
1.0000 | Freq: Once | ORAL | 0 refills | Status: AC
Start: 2016-05-12 — End: 2016-05-12

## 2016-05-12 NOTE — Progress Notes (Signed)
No allergies to eggs or soy. No problems with anesthesia.  Pt given Emmi instructions for colonoscopy  No oxygen use  No diet drug use  

## 2016-05-26 ENCOUNTER — Encounter: Payer: Self-pay | Admitting: Gastroenterology

## 2016-05-26 ENCOUNTER — Ambulatory Visit (AMBULATORY_SURGERY_CENTER): Payer: Medicare Other | Admitting: Gastroenterology

## 2016-05-26 VITALS — BP 117/78 | HR 49 | Temp 98.9°F | Resp 13 | Ht 70.0 in | Wt 225.0 lb

## 2016-05-26 DIAGNOSIS — D128 Benign neoplasm of rectum: Secondary | ICD-10-CM

## 2016-05-26 DIAGNOSIS — J40 Bronchitis, not specified as acute or chronic: Secondary | ICD-10-CM | POA: Diagnosis not present

## 2016-05-26 DIAGNOSIS — D123 Benign neoplasm of transverse colon: Secondary | ICD-10-CM

## 2016-05-26 DIAGNOSIS — I10 Essential (primary) hypertension: Secondary | ICD-10-CM | POA: Diagnosis not present

## 2016-05-26 DIAGNOSIS — Z86010 Personal history of colonic polyps: Secondary | ICD-10-CM

## 2016-05-26 DIAGNOSIS — J45909 Unspecified asthma, uncomplicated: Secondary | ICD-10-CM | POA: Diagnosis not present

## 2016-05-26 DIAGNOSIS — D124 Benign neoplasm of descending colon: Secondary | ICD-10-CM | POA: Diagnosis not present

## 2016-05-26 DIAGNOSIS — D129 Benign neoplasm of anus and anal canal: Secondary | ICD-10-CM

## 2016-05-26 DIAGNOSIS — E669 Obesity, unspecified: Secondary | ICD-10-CM | POA: Diagnosis not present

## 2016-05-26 HISTORY — PX: COLONOSCOPY: SHX174

## 2016-05-26 MED ORDER — SODIUM CHLORIDE 0.9 % IV SOLN: 500.0000 mL | INTRAVENOUS | Status: DC

## 2016-05-26 NOTE — Op Note (Signed)
[facility] Patient Name: Adrian [name] Procedure Date: 05/26/2016 1:15 PM MRN: [id number] Endoscopist: Ladene Artist , MD Age: 65 Referring MD:  Date of Birth: 08-30-1950 Gender: Male Account #: [id number] Procedure:                Colonoscopy Indications:              Surveillance: Personal history of adenomatous                            polyps on last colonoscopy > 3 years ago Medicines:                Monitored Anesthesia Care Procedure:                Pre-Anesthesia Assessment:                           - Prior to the procedure, a History and Physical                            was performed, and patient medications and                            allergies were reviewed. The patient's tolerance of                            previous anesthesia was also reviewed. The risks                            and benefits of the procedure and the sedation                            options and risks were discussed with the patient.                            All questions were answered, and informed consent                            was obtained. Prior Anticoagulants: The patient has                            taken no previous anticoagulant or antiplatelet                            agents. ASA Grade Assessment: II - A patient with                            mild systemic disease. After reviewing the risks                            and benefits, the patient was deemed in                            satisfactory condition to undergo the procedure.  After obtaining informed consent, the colonoscope                            was passed under direct vision. Throughout the                            procedure, the patient's blood pressure, pulse, and                            oxygen saturations were monitored continuously. The                            Model [id number]-[id number]) scope was introduced                            through the anus and  advanced to the the cecum,                            identified by appendiceal orifice and ileocecal                            valve. The ileocecal valve, appendiceal orifice,                            and rectum were photographed. The quality of the                            bowel preparation was adequate after extensive                            lavage and suctioning. The colonoscopy was                            performed without difficulty. The patient tolerated                            the procedure well. Scope In: 1:43:24 PM Scope Out: 1:58:03 PM Scope Withdrawal Time: 0 hours 11 minutes 15 seconds  Total Procedure Duration: 0 hours 14 minutes 39 seconds  Findings:                 The perianal and digital rectal examinations were                            normal.                           A 7 mm polyp was found in the descending colon. The                            polyp was sessile. The polyp was removed with a                            cold snare. Resection and retrieval were complete.  Internal hemorrhoids were found during                            retroflexion. The hemorrhoids were small and Grade                            I (internal hemorrhoids that do not prolapse).                           Two sessile polyps were found in the rectum and                            transverse colon. The polyps were 5 mm in size.                            These polyps were removed with a cold biopsy                            forceps. Resection and retrieval were complete.                           Multiple medium-mouthed diverticula were found in                            the sigmoid colon and descending colon. There was                            narrowing of the colon in association with the                            diverticular opening. There was evidence of                            diverticular spasm. Erythema was seen in                             association with the diverticular opening. There                            was no evidence of diverticular bleeding.                           The exam was otherwise without abnormality on                            direct and retroflexion views. Complications:            No immediate complications. Estimated blood loss:                            None. Estimated Blood Loss:     Estimated blood loss: none. Impression:               - One 7 mm polyp in the descending colon, removed  with a cold snare. Resected and retrieved.                           - Internal hemorrhoids.                           - Two 5 mm polyps in the rectum and in the                            transverse colon, removed with a cold biopsy                            forceps. Resected and retrieved.                           - Moderate diverticulosis in the sigmoid colon and                            in the descending colon.                           - The examination was otherwise normal on direct                            and retroflexion views. Recommendation:           - Repeat colonoscopy in 5 years for surveillance                            with a more extensive bowel prep.                           - Patient has a contact number available for                            emergencies. The signs and symptoms of potential                            delayed complications were discussed with the                            patient. Return to normal activities tomorrow.                            Written discharge instructions were provided to the                            patient.                           - Continue present medications.                           - Await pathology results.                           -  High fiber diet indefinitely. Ladene Artist, MD 05/26/2016 2:03:48 PM This report has been signed electronically.

## 2016-05-26 NOTE — Progress Notes (Signed)
To recovery, report to [place], RN, VSS 

## 2016-05-26 NOTE — Progress Notes (Signed)
Called to room to assist during endoscopic procedure.  Patient ID and intended procedure confirmed with present staff. Received instructions for my participation in the procedure from the performing physician.  

## 2016-05-26 NOTE — Patient Instructions (Signed)
YOU HAD AN ENDOSCOPIC PROCEDURE TODAY AT [facility]:   Refer to the procedure report that was given to you for any specific questions about what was found during the examination.  If the procedure report does not answer your questions, please call your gastroenterologist to clarify.  If you requested that your care partner not be given the details of your procedure findings, then the procedure report has been included in a sealed envelope for you to review at your convenience later.  YOU SHOULD EXPECT: Some feelings of bloating in the abdomen. Passage of more gas than usual.  Walking can help get rid of the air that was put into your GI tract during the procedure and reduce the bloating. If you had a lower endoscopy (such as a colonoscopy or flexible sigmoidoscopy) you may notice spotting of blood in your stool or on the toilet paper. If you underwent a bowel prep for your procedure, you may not have a normal bowel movement for a few days.  Please Note:  You might notice some irritation and congestion in your nose or some drainage.  This is from the oxygen used during your procedure.  There is no need for concern and it should clear up in a day or so.  SYMPTOMS TO REPORT IMMEDIATELY:   Following lower endoscopy (colonoscopy or flexible sigmoidoscopy):  Excessive amounts of blood in the stool  Significant tenderness or worsening of abdominal pains  Swelling of the abdomen that is new, acute  Fever of 100F or higher    For urgent or emergent issues, a gastroenterologist can be reached at any hour by calling [phone].   DIET:  We do recommend a small meal at first, but then you may proceed to your regular diet.  Drink plenty of fluids but you should avoid alcoholic beverages for 24 hours.  ACTIVITY:  You should plan to take it easy for the rest of today and you should NOT DRIVE or use heavy machinery until tomorrow (because of the sedation medicines used during the test).     FOLLOW UP: Our staff will call the number listed on your records the next business day following your procedure to check on you and address any questions or concerns that you may have regarding the information given to you following your procedure. If we do not reach you, we will leave a message.  However, if you are feeling well and you are not experiencing any problems, there is no need to return our call.  We will assume that you have returned to your regular daily activities without incident.  If any biopsies were taken you will be contacted by phone or by letter within the next 1-3 weeks.  Please call us at [phone] if you have not heard about the biopsies in 3 weeks.    SIGNATURES/CONFIDENTIALITY: You and/or your care partner have signed paperwork which will be entered into your electronic medical record.  These signatures attest to the fact that that the information above on your After Visit Summary has been reviewed and is understood.  Full responsibility of the confidentiality of this discharge information lies with you and/or your care-partner.   Information on polyps,diverticulosis,high fiber diet ,and hemorrhoids  given to you today  Await pathology results

## 2016-05-27 ENCOUNTER — Telehealth: Payer: Self-pay | Admitting: *Deleted

## 2016-05-27 NOTE — Telephone Encounter (Signed)
No answer, left message to call if questions or concerns. 

## 2016-05-27 NOTE — Telephone Encounter (Signed)
  Follow up Call-  Call back number 05/26/2016  Post procedure Call Back phone  # [phone]  Permission to leave phone message Yes  Some recent data might be hidden     No answer at 2nd attempt to reach patient.  No message left on vm.

## 2016-06-04 ENCOUNTER — Encounter: Payer: Self-pay | Admitting: Internal Medicine

## 2016-06-05 ENCOUNTER — Other Ambulatory Visit: Payer: Self-pay | Admitting: Internal Medicine

## 2016-06-05 MED ORDER — AMOXICILLIN-POT CLAVULANATE 875-125 MG PO TABS: 1.0000 | ORAL_TABLET | Freq: Two times a day (BID) | ORAL | 0 refills | Status: DC

## 2016-06-07 ENCOUNTER — Encounter: Payer: Self-pay | Admitting: Gastroenterology

## 2016-06-17 ENCOUNTER — Other Ambulatory Visit: Payer: Self-pay | Admitting: Family Medicine

## 2016-07-07 ENCOUNTER — Encounter: Payer: Self-pay | Admitting: Internal Medicine

## 2016-07-12 DIAGNOSIS — Z961 Presence of intraocular lens: Secondary | ICD-10-CM | POA: Diagnosis not present

## 2016-07-19 ENCOUNTER — Encounter: Payer: Self-pay | Admitting: Internal Medicine

## 2016-09-07 ENCOUNTER — Other Ambulatory Visit: Payer: Self-pay | Admitting: Family Medicine

## 2016-09-28 ENCOUNTER — Other Ambulatory Visit: Payer: Self-pay | Admitting: Internal Medicine

## 2016-12-13 ENCOUNTER — Other Ambulatory Visit: Payer: Self-pay | Admitting: *Deleted

## 2016-12-13 MED ORDER — ZOLPIDEM TARTRATE 10 MG PO TABS
10.0000 mg | ORAL_TABLET | Freq: Every evening | ORAL | 0 refills | Status: DC | PRN
Start: 2016-12-13 — End: 2017-02-24

## 2016-12-13 NOTE — Telephone Encounter (Signed)
Pt wife sent email requesting ND to call in zolpidem on 7/13, MD ok authorization, but its up under wife chart. Updated Med list and called rx into [facility]...Adrian [name]

## 2016-12-26 ENCOUNTER — Other Ambulatory Visit: Payer: Self-pay | Admitting: Family Medicine

## 2017-01-20 ENCOUNTER — Ambulatory Visit (INDEPENDENT_AMBULATORY_CARE_PROVIDER_SITE_OTHER): Payer: Medicare Other | Admitting: Family Medicine

## 2017-01-20 ENCOUNTER — Encounter: Payer: Self-pay | Admitting: Family Medicine

## 2017-01-20 DIAGNOSIS — M19079 Primary osteoarthritis, unspecified ankle and foot: Secondary | ICD-10-CM

## 2017-01-20 NOTE — Patient Instructions (Addendum)
Thank you for coming in,   You can ice the toe to help with the pain. Please try a rigid soled shoe to keep the toe from flexing which can help with the pain.    Please feel free to call with any questions or concerns at any time, at [phone]. --Dr. Raeford Razor

## 2017-01-20 NOTE — Assessment & Plan Note (Addendum)
He did well with previous injection. He has an acute flare today. - First MTP joint injection today. - Advised to wear a stiffer soled shoe and continue his orthotics. - He will continue to ice and apply topical anti-inflammatories. - Can consider another injection in 3 months if needed.

## 2017-01-20 NOTE — Progress Notes (Signed)
Adrian [name] - 66 y.o. male MRN [id number]  Date of birth: 08/20/1950  SUBJECTIVE:  Including CC & ROS.  Chief Complaint  Patient presents with  . Injections    in right big toe, patient states the last couple of days it has been hurting really bad   [name] is a 66 year old male that is presenting with acute on chronic right first MTP joint pain. This is occurring on the dorsal and medial aspect of his first MTP joint. This started acting up about 2 weeks ago. There is pain with flexion and walking. He denies any trauma or injury to his toe. He does not currently take any oral medications for the pain. He has used Pennsaid. He denies any redness or a history of gout. He reports significant improvement of his pain with the previous toe injection.   I have independently reviewed his ultrasound from 03/18/16 which shows first MTP joint spur with effusion. He was seen by Dr. Tamala Julian on 03/09/16 and was provided a first MTP joint injection.  Review of Systems  Cardiovascular: Negative for leg swelling.  Musculoskeletal: Positive for arthralgias and joint swelling. Negative for gait problem.  Skin: Negative for color change.  Neurological: Negative for weakness and numbness.  Hematological: Negative for adenopathy.   otherwise negative  HISTORY: Past Medical, Surgical, Social, and Family History Reviewed & Updated per EMR.   Pertinent Historical Findings include:  Past Medical History:  Diagnosis Date  . Allergic rhinitis   . Cancer ([place]) 2016   basal cell skin cancer nose  . Cataract   . GERD (gastroesophageal reflux disease)   . Hyperlipidemia   . Hypertension   . Hypothyroidism 2010   mild  . Hypothyroidism   . Trigeminal neuralgia    L.  . Vitamin B12 deficiency 2010    Past Surgical History:  Procedure Laterality Date  . arthroscopic surgery for chondromalacia Left 1995  . CATARACT EXTRACTION W/ INTRAOCULAR LENS  IMPLANT, BILATERAL  2017  . COLONOSCOPY    . gamma knife  surgery for trigeminal neuralgia  2005  . inguinal herniorrhahpy  1991   L.  Marland Kitchen POLYPECTOMY    . TONSILLECTOMY  1959  . TRIGEMINAL NERVE DECOMPRESSION  2008   Left  Dr. Claybon Jabs    Allergies  Allergen Reactions  . Tape Rash    Irritation  . Triple Antibiotic [Bacitracin-Neomycin-Polymyxin] Rash    Family History  Problem Relation Age of Onset  . Heart disease Mother 58       MI  . Diabetes Mother   . Cancer Father 57       prostate  . Cancer Sister 26       breast ca  . Stroke Brother   . Diabetes Other        1st degree relative  . Prostate cancer Unknown        1st degree relative <50  . Colon cancer Neg Hx   . Esophageal cancer Neg Hx   . Rectal cancer Neg Hx   . Stomach cancer Neg Hx      Social History   Social History  . Marital status: Married    Spouse name: N/A  . Number of children: N/A  . Years of education: N/A   Occupational History  . 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 Smoking status: Former Smoker    Quit date: 05/31/1992  . Smokeless tobacco: Never Used  .  Alcohol use 4.2 oz/week    7 Glasses of wine per week  . Drug use: No  . Sexual activity: Yes   Other Topics Concern  . Not on file   Social History Narrative   UCD   Married x2: 12 years 58st; 15 years ('59) 2nd   One daughter, step daugfhter, step son   Likes to bike     PHYSICAL EXAM:  VS: BP 130/84 (BP Location: Left Arm, Patient Position: Sitting, Cuff Size: Normal)   Pulse (!) 51   Temp 98.4 F (36.9 C) (Oral)   Ht 5\' 10"  (1.778 m)   Wt 218 lb (98.9 kg)   SpO2 99%   BMI 31.28 kg/m  Physical Exam Gen: NAD, alert, cooperative with exam, well-appearing ENT: normal lips, normal nasal mucosa,  Eye: normal EOM, normal conjunctiva and lids CV:  no edema, +2 pedal pulses   Resp: no accessory muscle use, non-labored,  Skin: no rashes, no areas of induration  Neuro: normal tone, normal sensation to touch Psych:  normal insight,  alert and oriented MSK:  Right foot: He has loss of his transverse and longitudinal arch. He has a Morton's foot. He has hypertrophy of the first MTP joint. He has limited flexion and extension of the great toe. No overlying redness. Neurovascularly intact   Aspiration/Injection Procedure Note Brevan Luberto April 08, 66 1952  Procedure: Injection Indications: Right first MTP joint pain  Procedure Details Consent: Risks of procedure as well as the alternatives and risks of each were explained to the (patient/caregiver).  Consent for procedure obtained. Time Out: Verified patient identification, verified procedure, site/side was marked, verified correct patient position, special equipment/implants available, medications/allergies/relevent history reviewed, required imaging and test results available.  Performed.  The area was cleaned with iodine and alcohol swabs.    The right first MTP joint was injected using 0.5 cc's of 40 mg Kenalog and 0.5 cc's of 1% lidocaine with a 25 1 1/2" needle.  Ultrasound was used. Images were obtained in Transverse views showing the injection.   A sterile dressing was applied.  Patient did tolerate procedure well.        ASSESSMENT & PLAN:   Arthritis of first MTP joint He did well with previous injection. He has an acute flare today. - First MTP joint injection today. - Advised to wear a stiffer soled shoe and continue his orthotics. - He will continue to ice and apply topical anti-inflammatories. - Can consider another injection in 3 months if needed.

## 2017-02-08 ENCOUNTER — Ambulatory Visit (INDEPENDENT_AMBULATORY_CARE_PROVIDER_SITE_OTHER): Payer: Medicare Other | Admitting: General Practice

## 2017-02-08 DIAGNOSIS — Z23 Encounter for immunization: Secondary | ICD-10-CM | POA: Diagnosis not present

## 2017-02-24 ENCOUNTER — Other Ambulatory Visit: Payer: Self-pay

## 2017-02-24 MED ORDER — ZOLPIDEM TARTRATE 10 MG PO TABS: 10.0000 mg | ORAL_TABLET | Freq: Every evening | ORAL | 0 refills | Status: DC | PRN

## 2017-02-24 NOTE — Telephone Encounter (Signed)
Rec'd fax from [place]- please advise in Dr Plotnikovs absence  Has appt 05/26/17

## 2017-02-25 NOTE — Telephone Encounter (Signed)
Called in.

## 2017-03-24 ENCOUNTER — Other Ambulatory Visit: Payer: Self-pay | Admitting: Family Medicine

## 2017-03-24 NOTE — Telephone Encounter (Signed)
Refill denied. Pt completed course of treatment.  

## 2017-03-28 ENCOUNTER — Other Ambulatory Visit: Payer: Self-pay | Admitting: Family Medicine

## 2017-03-28 NOTE — Telephone Encounter (Signed)
Refill denied. Pt has not been seen in over a year.  

## 2017-04-03 ENCOUNTER — Other Ambulatory Visit: Payer: Self-pay | Admitting: Family Medicine

## 2017-04-06 ENCOUNTER — Encounter: Payer: Self-pay | Admitting: Internal Medicine

## 2017-04-06 NOTE — Telephone Encounter (Signed)
Refill denied. Pt has not been seen by dr Tamala Julian in over a year.

## 2017-04-07 MED ORDER — AMITRIPTYLINE HCL 25 MG PO TABS: 25.0000 mg | ORAL_TABLET | Freq: Every day | ORAL | 5 refills | Status: DC

## 2017-04-11 ENCOUNTER — Encounter: Payer: Self-pay | Admitting: Family Medicine

## 2017-04-11 ENCOUNTER — Ambulatory Visit (INDEPENDENT_AMBULATORY_CARE_PROVIDER_SITE_OTHER)
Admission: RE | Admit: 2017-04-11 | Discharge: 2017-04-11 | Disposition: A | Discharge status: Home / Self Care | Payer: Medicare Other | Source: Ambulatory Visit | Attending: Family Medicine | Admitting: Family Medicine

## 2017-04-11 ENCOUNTER — Ambulatory Visit: Payer: Self-pay

## 2017-04-11 ENCOUNTER — Ambulatory Visit: Payer: Medicare Other | Admitting: Family Medicine

## 2017-04-11 VITALS — BP 140/90 | HR 54 | Ht 69.0 in | Wt 218.0 lb

## 2017-04-11 DIAGNOSIS — M19079 Primary osteoarthritis, unspecified ankle and foot: Secondary | ICD-10-CM | POA: Diagnosis not present

## 2017-04-11 DIAGNOSIS — M19071 Primary osteoarthritis, right ankle and foot: Secondary | ICD-10-CM | POA: Diagnosis not present

## 2017-04-11 DIAGNOSIS — M79674 Pain in right toe(s): Secondary | ICD-10-CM

## 2017-04-11 NOTE — Progress Notes (Signed)
[name] [address] [place], [place] [address] Phone: [phone] Subjective:    I'm seeing this patient by the request  of:    CC: Right foot pain  [id number]  Adrian [name] is a 66 y.o. male coming in with complaint of right toe pain.  Onset- 2-3 days ago Location- great toe Duration-  Character- TTP Aggravating factors- Flexion, extension, walking  Reliving factors- Ibuprofen Patient has been seen for this and had a first metatarsal arthritis.  Patient has had worsening symptoms.  Started affect daily activities.     Past Medical History:  Diagnosis Date  . Allergic rhinitis   . Cancer ([facility]) 2016   basal cell skin cancer nose  . Cataract   . GERD (gastroesophageal reflux disease)   . Hyperlipidemia   . Hypertension   . Hypothyroidism 2010   mild  . Hypothyroidism   . Trigeminal neuralgia    L.  . Vitamin B12 deficiency 2010   Past Surgical History:  Procedure Laterality Date  . arthroscopic surgery for chondromalacia Left 1995  . CATARACT EXTRACTION W/ INTRAOCULAR LENS  IMPLANT, BILATERAL  2017  . COLONOSCOPY    . gamma knife surgery for trigeminal neuralgia  2005  . inguinal herniorrhahpy  1991   L.  Marland Kitchen POLYPECTOMY    . TONSILLECTOMY  1959  . TRIGEMINAL NERVE DECOMPRESSION  2008   Left  Dr. Claybon Jabs   Social History   Socioeconomic History  . Marital status: Married    Spouse name: None  . Number of children: None  . Years of education: None  . Highest education level: None  Social Needs  . Financial resource strain: None  . Food insecurity - worry: None  . Food insecurity - inability: None  . Transportation needs - medical: None  . Transportation needs - non-medical: None  Occupational History  . 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 Smoking status: Former Smoker    Last attempt to quit: 05/31/1992    Years since quitting: 24.8  . Smokeless tobacco: Never Used    Substance and Sexual Activity  . Alcohol use: Yes    Alcohol/week: 4.2 oz    Types: 7 Glasses of wine per week  . Drug use: No  . Sexual activity: Yes  Other Topics Concern  . None  Social History Narrative   UCD   Married x2: 12 years 25st; 15 years ('80) 2nd   One daughter, step daugfhter, step son   Likes to bike   Allergies  Allergen Reactions  . Tape Rash    Irritation  . Triple Antibiotic [Bacitracin-Neomycin-Polymyxin] Rash   Family History  Problem Relation Age of Onset  . Heart disease Mother 72       MI  . Diabetes Mother   . Cancer Father 46       prostate  . Cancer Sister 82       breast ca  . Stroke Brother   . Diabetes Other        1st degree relative  . Prostate cancer Unknown        1st degree relative <50  . Colon cancer Neg Hx   . Esophageal cancer Neg Hx   . Rectal cancer Neg Hx   . Stomach cancer Neg Hx      Past medical history, social, surgical and family history all reviewed in electronic medical record.  No pertanent information unless  stated regarding to the chief complaint.   Review of Systems:Review of systems updated and as accurate as of 04/11/17  No headache, visual changes, nausea, vomiting, diarrhea, constipation, dizziness, abdominal pain, skin rash, fevers, chills, night sweats, weight loss, swollen lymph nodes, body aches, joint swelling, muscle aches, chest pain, shortness of breath, mood changes.   Objective  Blood pressure 140/90, pulse (!) 54, height 5\' 9"  (1.753 m), weight 218 lb (98.9 kg), SpO2 97 %. Systems examined below as of 04/11/17   General: No apparent distress alert and oriented x3 mood and affect normal, dressed appropriately.  HEENT: Pupils equal, extraocular movements intact  Respiratory: Patient's speak in full sentences and does not appear short of breath  Cardiovascular: No lower extremity edema, non tender, no erythema  Skin: Warm dry intact with no signs of infection or rash on extremities or on axial  skeleton.  Abdomen: Soft nontender  Neuro: Cranial nerves II through XII are intact, neurovascularly intact in all extremities with 2+ DTRs and 2+ pulses.  Lymph: No lymphadenopathy of posterior or anterior cervical chain or axillae bilaterally.  Gait mild antalgic gait MSK:  Non tender with full range of motion and good stability and symmetric strength and tone of shoulders, elbows, wrist, hip, knee and ankles bilaterally.  Arthritic changes in multiple joints .  Exam shows the patient does have severe arthritic changes with breakdown of the longitudinal and transverse bilaterally right greater than left.  Large bunion formation noted on the right.  Capsulitis noted.  Severe tenderness over the first MTP joint.  Limited musculoskeletal ultrasound was performed and interpreted by Lyndal Pulley  Limited ultrasound of patient's first metatarsal shows the patient does have what seems to be a capsulitis.  Patient does have significant vascularity in the area.  Seems to be somewhat abnormal.  Pulsatile. Impression: Capsulitis with abnormal vascularity.  Procedure: Real-time Ultrasound Guided Injection of right first MTP Device: GE Logiq Q7 Ultrasound guided injection is preferred based studies that show increased duration, increased effect, greater accuracy, decreased procedural pain, increased response rate, and decreased cost with ultrasound guided versus blind injection.  Verbal informed consent obtained.  Time-out conducted.  Noted no overlying erythema, induration, or other signs of local infection.  Skin prepped in a sterile fashion.  Local anesthesia: Topical Ethyl chloride.  With sterile technique and under real time ultrasound guidance: With a 25-gauge half inch needle injected with a total of 0.5 cc of 0.5% Marcaine and 0.5 cc of Kenalog 40 mg/mL. Completed without difficulty  Pain immediately resolved suggesting accurate placement of the medication.  Advised to call if fevers/chills,  erythema, induration, drainage, or persistent bleeding.  Images permanently stored and available for review in the ultrasound unit.  Impression: Technically successful ultrasound guided injection.   Impression and Recommendations:     This case required medical decision making of moderate complexity.      Note: This dictation was prepared with Dragon dictation along with smaller phrase technology. Any transcriptional errors that result from this process are unintentional.

## 2017-04-11 NOTE — Assessment & Plan Note (Signed)
Repeat injection.  Tolerated the first with very well as well as this 1.  Hopefully will be beneficial.  X-rays ordered today secondary to the abnormal vascularity.  Potential capsulitis and possibly more of a hematoma noted.  We will see how patient does and follow up with me in 2 weeks.  Advanced imaging may be warranted if it continues.

## 2017-04-11 NOTE — Patient Instructions (Signed)
Good to see you  Ice is your friend Continue the good shoes and avoid being barefoot.  I hope the injection helps Xray downstairs today  See me again in 2 weeks!  If not better we may need mri and potentially some labs

## 2017-04-12 ENCOUNTER — Ambulatory Visit: Payer: Self-pay | Admitting: Family Medicine

## 2017-04-12 MED ORDER — VITAMIN D (ERGOCALCIFEROL) 1.25 MG (50000 UNIT) PO CAPS: ORAL_CAPSULE | ORAL | 0 refills | Status: DC

## 2017-04-25 NOTE — Progress Notes (Signed)
[name] [place], [place] [address] Phone: [phone] Subjective:      CC: Toe pain follow-up  [id number]  Adrian [name] is a 66 y.o. male coming in for follow up for great toe pain. He does not have tenderness to the touch. He notes that most of his pain is during physical activity. He does not some improvement from last visit though.  Patient states 50% better.     Past Medical History:  Diagnosis Date  . Allergic rhinitis   . Cancer ([facility]) 2016   basal cell skin cancer nose  . Cataract   . GERD (gastroesophageal reflux disease)   . Hyperlipidemia   . Hypertension   . Hypothyroidism 2010   mild  . Hypothyroidism   . Trigeminal neuralgia    L.  . Vitamin B12 deficiency 2010   Past Surgical History:  Procedure Laterality Date  . arthroscopic surgery for chondromalacia Left 1995  . CATARACT EXTRACTION W/ INTRAOCULAR LENS  IMPLANT, BILATERAL  2017  . COLONOSCOPY    . gamma knife surgery for trigeminal neuralgia  2005  . inguinal herniorrhahpy  1991   L.  Marland Kitchen POLYPECTOMY    . TONSILLECTOMY  1959  . TRIGEMINAL NERVE DECOMPRESSION  2008   Left  Dr. Claybon Jabs   Social History   Socioeconomic History  . Marital status: Married    Spouse name: None  . Number of children: None  . Years of education: None  . Highest education level: None  Social Needs  . Financial resource strain: None  . Food insecurity - worry: None  . Food insecurity - inability: None  . Transportation needs - medical: None  . Transportation needs - non-medical: None  Occupational History  . 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 Smoking status: Former Smoker    Last attempt to quit: 05/31/1992    Years since quitting: 24.9  . Smokeless tobacco: Never Used  Substance and Sexual Activity  . Alcohol use: Yes    Alcohol/week: 4.2 oz    Types: 7 Glasses of wine per week  . Drug use: No  . Sexual activity:  Yes  Other Topics Concern  . None  Social History Narrative   UCD   Married x2: 12 years 58st; 15 years ('68) 2nd   One daughter, step daugfhter, step son   Likes to bike   Allergies  Allergen Reactions  . Tape Rash    Irritation  . Triple Antibiotic [Bacitracin-Neomycin-Polymyxin] Rash   Family History  Problem Relation Age of Onset  . Heart disease Mother 2       MI  . Diabetes Mother   . Cancer Father 15       prostate  . Cancer Sister 68       breast ca  . Stroke Brother   . Diabetes Other        1st degree relative  . Prostate cancer Unknown        1st degree relative <50  . Colon cancer Neg Hx   . Esophageal cancer Neg Hx   . Rectal cancer Neg Hx   . Stomach cancer Neg Hx      Past medical history, social, surgical and family history all reviewed in electronic medical record.  No pertanent information unless stated regarding to the chief complaint.   Review of Systems:Review of systems updated and as accurate as of 04/26/17  No headache, visual changes, nausea, vomiting, diarrhea, constipation, dizziness, abdominal pain, skin rash, fevers, chills, night sweats, weight loss, swollen lymph nodes, body aches, joint swelling, muscle aches, chest pain, shortness of breath, mood changes.   Objective  Blood pressure 122/82, pulse (!) 58, height 5' 9.5" (1.765 m), weight 217 lb (98.4 kg), SpO2 98 %. Systems examined below as of 04/26/17   General: No apparent distress alert and oriented x3 mood and affect normal, dressed appropriately.  HEENT: Pupils equal, extraocular movements intact  Respiratory: Patient's speak in full sentences and does not appear short of breath  Cardiovascular: No lower extremity edema, non tender, no erythema  Skin: Warm dry intact with no signs of infection or rash on extremities or on axial skeleton.  Abdomen: Soft nontender  Neuro: Cranial nerves II through XII are intact, neurovascularly intact in all extremities with 2+ DTRs and 2+  pulses.  Lymph: No lymphadenopathy of posterior or anterior cervical chain or axillae bilaterally.  Gait normal with good balance and coordination.  MSK:  Non tender with full range of motion and good stability and symmetric strength and tone of shoulders, elbows, wrist, hip, knee and ankles bilaterally.  Operative changes of multiple joints Foot exam shows significant pes planus.  Patient does have significant arthritic changes with hallux rigidus of the first toe.  Minimal tenderness over the MTP.  Improvement from previous exam.  Significant collapsing of the transverse arch.  Limited musculoskeletal ultrasound was performed and interpreted by Lyndal Pulley  Limited ultrasound of the first toe did show the patient still has significant arthritic changes but no true capsulitis noted.  Increasing Doppler flow that was seen previously either. Impression: First toe arthritis   Impression and Recommendations:     This case required medical decision making of moderate complexity.      Note: This dictation was prepared with Dragon dictation along with smaller phrase technology. Any transcriptional errors that result from this process are unintentional.

## 2017-04-26 ENCOUNTER — Ambulatory Visit: Payer: Self-pay

## 2017-04-26 ENCOUNTER — Ambulatory Visit: Payer: Medicare Other | Admitting: Family Medicine

## 2017-04-26 ENCOUNTER — Encounter: Payer: Self-pay | Admitting: Family Medicine

## 2017-04-26 VITALS — BP 122/82 | HR 58 | Ht 69.5 in | Wt 217.0 lb

## 2017-04-26 DIAGNOSIS — M19079 Primary osteoarthritis, unspecified ankle and foot: Secondary | ICD-10-CM

## 2017-04-26 DIAGNOSIS — M79675 Pain in left toe(s): Secondary | ICD-10-CM | POA: Diagnosis not present

## 2017-04-26 DIAGNOSIS — M2140 Flat foot [pes planus] (acquired), unspecified foot: Secondary | ICD-10-CM

## 2017-04-26 NOTE — Assessment & Plan Note (Signed)
Patient does have arthritis of the first toe and did have capsulitis previously.  We discussed proper shoe choices and I do feel that custom orthotics could be beneficial for the pes planus.  We discussed that we will get more control with the first MTP.  Patient will undergo this.  Follow-up with me again in 4 weeks.

## 2017-04-26 NOTE — Patient Instructions (Addendum)
Good to see you  I am glad you are getting better  Ice is your friend Continue the good shoes and likely indefinitely.   We will get you in orthotics, we will call you when we get them in See me again in 2 months otherwise.

## 2017-04-27 ENCOUNTER — Ambulatory Visit: Payer: Self-pay | Admitting: Family Medicine

## 2017-04-28 ENCOUNTER — Ambulatory Visit: Payer: Medicare Other | Admitting: Family Medicine

## 2017-04-28 DIAGNOSIS — M2140 Flat foot [pes planus] (acquired), unspecified foot: Secondary | ICD-10-CM | POA: Diagnosis not present

## 2017-04-28 NOTE — Assessment & Plan Note (Signed)
Patient does have significant pes planus.  Put in custom orthotics today.  We discussed wearing them slowly over the course the next several days.  Follow-up again in 4 weeks

## 2017-04-28 NOTE — Progress Notes (Signed)
Procedure Note   Patient was fitted for a : standard, cushioned, semi-rigid orthotic. The orthotic was heated and afterward the patient patient seated position and molded The patient was positioned in subtalar neutral position and 10 degrees of ankle dorsiflexion in a weight bearing stance. After completion of molding, patient did have orthotic management The blank was ground to a stable position for weight bearing. Size:11 Base: Carbon fiber Additional Posting and Padding: Left medial 250/60 x2 Right medial 250/60x2 trans 200/40 The patient ambulated these, and they were very comfortable.

## 2017-05-02 ENCOUNTER — Other Ambulatory Visit: Payer: Self-pay | Admitting: Internal Medicine

## 2017-05-02 ENCOUNTER — Encounter: Payer: Self-pay | Admitting: Internal Medicine

## 2017-05-04 ENCOUNTER — Other Ambulatory Visit: Payer: Self-pay | Admitting: Internal Medicine

## 2017-05-04 MED ORDER — ZOLPIDEM TARTRATE 10 MG PO TABS: 5.0000 mg | ORAL_TABLET | Freq: Every evening | ORAL | 1 refills | Status: DC | PRN

## 2017-05-06 ENCOUNTER — Encounter: Payer: Medicare Other | Admitting: Internal Medicine

## 2017-05-20 ENCOUNTER — Encounter: Payer: Self-pay | Admitting: Family

## 2017-05-20 ENCOUNTER — Ambulatory Visit: Payer: Medicare Other | Admitting: Family

## 2017-05-20 VITALS — BP 130/82 | HR 65 | Temp 98.2°F | Wt 220.0 lb

## 2017-05-20 DIAGNOSIS — J209 Acute bronchitis, unspecified: Secondary | ICD-10-CM | POA: Diagnosis not present

## 2017-05-20 DIAGNOSIS — J019 Acute sinusitis, unspecified: Secondary | ICD-10-CM

## 2017-05-20 DIAGNOSIS — L309 Dermatitis, unspecified: Secondary | ICD-10-CM | POA: Diagnosis not present

## 2017-05-20 MED ORDER — AMOXICILLIN-POT CLAVULANATE 875-125 MG PO TABS: 1.0000 | ORAL_TABLET | Freq: Two times a day (BID) | ORAL | 0 refills | Status: DC

## 2017-05-20 MED ORDER — HALOBETASOL PROPIONATE 0.05 % EX CREA: TOPICAL_CREAM | CUTANEOUS | 0 refills | Status: DC

## 2017-05-20 MED ORDER — HYDROCOD POLST-CPM POLST ER 10-8 MG/5ML PO SUER: 5.0000 mL | Freq: Every evening | ORAL | 0 refills | Status: DC | PRN

## 2017-05-20 NOTE — Progress Notes (Signed)
Adrian [name] is a 66 y.o. male with the following history as recorded in [employer]:  Patient Active Problem List   Diagnosis Date Noted  . Pes planus 04/26/2017  . 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 [place] arthritis 01/01/2016  . Ear pain 06/04/2015  . URI, acute 06/04/2014  . Hematuria 04/08/2014  . Cough 10/02/2013  . Asthmatic bronchitis 10/02/2013  . Colon polyps 03/28/2013  . Well adult exam 12/24/2010  . HEART MURMUR, SYSTOLIC 34/19/6222  . TOBACCO USE, QUIT 03/17/2009  . Hypothyroidism 12/10/2008  . B12 deficiency 12/10/2008  . Abnormal CBC 08/05/2008  . INSECT BITE 10/03/2007  . GROSS HEMATURIA 09/18/2007  . NEURALGIA, TRIGEMINAL 01/27/2007  . Essential hypertension 01/27/2007  . ALLERGIC RHINITIS 01/27/2007  . GERD 01/27/2007    Current Outpatient Medications  Medication Sig Dispense Refill  . aspirin 81 MG tablet Take 1 tablet (81 mg total) by mouth at bedtime. 30 tablet 11  . gentamicin ointment (GARAMYCIN) 0.1 % APPLY  THREE TIMES DAILY 30 g 2  . guaiFENesin (MUCINEX) 600 MG 12 hr tablet Take 1 tablet (600 mg total) by mouth 2 (two) times daily as needed for cough or to loosen phlegm. 14 tablet 0  . halobetasol (ULTRAVATE) 0.05 % cream APPLY  CREAM TOPICALLY TWICE DAILY AS NEEDED 50 g 0  . levothyroxine (SYNTHROID, LEVOTHROID) 75 MCG tablet Take 1 tablet (75 mcg total) by mouth daily. 90 tablet 3  . loratadine (CLARITIN) 10 MG tablet Take 10 mg by mouth daily.      Marland Kitchen losartan (COZAAR) 100 MG tablet Take 0.5 tablets (50 mg total) by mouth daily. 90 tablet 3  . Magnesium 100 MG CAPS Take 1 capsule by mouth daily.    . Melatonin 10 MG TABS Take 1 each by mouth at bedtime. Takes 3 mg at bedtime    . Misc Natural Products (TART CHERRY ADVANCED) CAPS Take by mouth daily.    . Multiple Vitamins-Minerals (CENTRUM SILVER ADULT 50+ PO) Take 1 tablet by mouth at bedtime.    . Probiotic Product (PROBIOTIC DAILY PO) Take 1 tablet by mouth at bedtime.    . TURMERIC PO  Take by mouth daily.    Marland Kitchen VALERIAN ROOT PO Take 1,500 mg by mouth at bedtime.    . Vitamin D, Ergocalciferol, (DRISDOL) 50000 units CAPS capsule TAKE ONE CAPSULE BY MOUTH EVERY 7 DAYS 12 capsule 0  . Zinc 50 MG TABS Take 1 tablet by mouth every morning.      . zolpidem (AMBIEN) 10 MG tablet Take 0.5-1 tablets (5-10 mg total) by mouth at bedtime as needed for sleep. 90 tablet 1  . amoxicillin-clavulanate (AUGMENTIN) 875-125 MG tablet Take 1 tablet by mouth 2 (two) times daily. 20 tablet 0  . chlorpheniramine-HYDROcodone (TUSSIONEX PENNKINETIC ER) 10-8 MG/5ML SUER Take 5 mLs by mouth at bedtime as needed for cough. 115 mL 0   No current facility-administered medications for this visit.     Allergies: Elavil [amitriptyline hcl]; Tape; and Triple antibiotic [bacitracin-neomycin-polymyxin]  Past Medical History:  Diagnosis Date  . Allergic rhinitis   . Cancer ([place]) 2016   basal cell skin cancer nose  . Cataract   . GERD (gastroesophageal reflux disease)   . Hyperlipidemia   . Hypertension   . Hypothyroidism 2010   mild  . Hypothyroidism   . Trigeminal neuralgia    L.  . Vitamin B12 deficiency 2010    Past Surgical History:  Procedure Laterality Date  . arthroscopic surgery  for chondromalacia Left 1995  . CATARACT EXTRACTION W/ INTRAOCULAR LENS  IMPLANT, BILATERAL  2017  . COLONOSCOPY    . gamma knife surgery for trigeminal neuralgia  2005  . inguinal herniorrhahpy  1991   L.  Marland Kitchen POLYPECTOMY    . TONSILLECTOMY  1959  . TRIGEMINAL NERVE DECOMPRESSION  2008   Left  Dr. Claybon Jabs    Family History  Problem Relation Age of Onset  . Heart disease Mother 53       MI  . Diabetes Mother   . Cancer Father 91       prostate  . Cancer Sister 30       breast ca  . Stroke Brother   . Diabetes Other        1st degree relative  . Prostate cancer Unknown        1st degree relative <50  . Colon cancer Neg Hx   . Esophageal cancer Neg Hx   . Rectal cancer Neg Hx   . Stomach cancer Neg  Hx     Social History   Tobacco Use  . Smoking status: Former Smoker    Last attempt to quit: 05/31/1992    Years since quitting: 24.9  . Smokeless tobacco: Never Used  Substance Use Topics  . Alcohol use: Yes    Alcohol/week: 4.2 oz    Types: 7 Glasses of wine per week    Subjective:  Patient presents with cough/ congestion x 3-4 days; feels like moving "deeper into the chest." No specific fever; + productive cough; recently traveled; + Mucinex; does have Breo at home but does not use regularly; just returned from trip to [place]; symptoms were present before flying.  Also requesting refill on halobetasol for dry skin/ dry scalp;   Objective:  Vitals:   05/20/17 1121  BP: 130/82  Pulse: 65  Temp: 98.2 F (36.8 C)  TempSrc: Oral  SpO2: 95%  Weight: 220 lb 0.6 oz (99.8 kg)    General: Well developed, well nourished, in no acute distress  Skin : Warm and dry. Dry scaly patch noted on left side of scalp Head: Normocephalic and atraumatic  Eyes: Sclera and conjunctiva clear; pupils round and reactive to light; extraocular movements intact  Ears: External normal; canals clear; tympanic membranes normal  Oropharynx: Pink, supple. No suspicious lesions  Neck: Supple without thyromegaly, adenopathy  Lungs: Respirations unlabored; clear to auscultation bilaterally without wheeze, rales, rhonchi  CVS exam: normal rate and regular rhythm.  Neurologic: Alert and oriented; speech intact; face symmetrical; moves all extremities well; CNII-XII intact without focal deficit   Assessment:  1. Acute sinusitis, recurrence not specified, unspecified location   2. Acute bronchitis, unspecified organism   3. Dermatitis     Plan:  1. & 2. Rx for Augmentin 875 mg bid x 10 days; recommend to use Flonase; can also add Breo if symptoms not improving in 24-48 hours; Rx for Tussionex given to fill and use at night if needed; increase fluids, rest and follow-up worse, no better. 3. Refill on Ultravate  cream as requested; to consider dermatology for possible psoriasis on scalp.   No Follow-up on file.  No orders of the defined types were placed in this encounter.   Requested Prescriptions   Signed Prescriptions Disp Refills  . amoxicillin-clavulanate (AUGMENTIN) 875-125 MG tablet 20 tablet 0    Sig: Take 1 tablet by mouth 2 (two) times daily.  . chlorpheniramine-HYDROcodone (TUSSIONEX PENNKINETIC ER) 10-8 MG/5ML SUER 115 mL 0  Sig: Take 5 mLs by mouth at bedtime as needed for cough.  . halobetasol (ULTRAVATE) 0.05 % cream 50 g 0    Sig: APPLY  CREAM TOPICALLY TWICE DAILY AS NEEDED

## 2017-05-21 ENCOUNTER — Other Ambulatory Visit: Payer: Self-pay | Admitting: Internal Medicine

## 2017-05-26 ENCOUNTER — Ambulatory Visit (INDEPENDENT_AMBULATORY_CARE_PROVIDER_SITE_OTHER): Payer: Medicare Other | Admitting: Internal Medicine

## 2017-05-26 ENCOUNTER — Encounter: Payer: Self-pay | Admitting: Internal Medicine

## 2017-05-26 ENCOUNTER — Other Ambulatory Visit (INDEPENDENT_AMBULATORY_CARE_PROVIDER_SITE_OTHER): Payer: Medicare Other

## 2017-05-26 VITALS — BP 136/82 | HR 62 | Temp 97.3°F | Ht 69.0 in | Wt 215.0 lb

## 2017-05-26 DIAGNOSIS — I10 Essential (primary) hypertension: Secondary | ICD-10-CM

## 2017-05-26 DIAGNOSIS — Z0000 Encounter for general adult medical examination without abnormal findings: Secondary | ICD-10-CM

## 2017-05-26 DIAGNOSIS — E034 Atrophy of thyroid (acquired): Secondary | ICD-10-CM

## 2017-05-26 DIAGNOSIS — R7989 Other specified abnormal findings of blood chemistry: Secondary | ICD-10-CM

## 2017-05-26 DIAGNOSIS — E538 Deficiency of other specified B group vitamins: Secondary | ICD-10-CM

## 2017-05-26 LAB — CBC WITH DIFFERENTIAL/PLATELET
BASOS ABS: 0 10*3/uL (ref 0.0–0.1)
BASOS PCT: 0.5 % (ref 0.0–3.0)
EOS ABS: 0.4 10*3/uL (ref 0.0–0.7)
Eosinophils Relative: 7.5 % — ABNORMAL HIGH (ref 0.0–5.0)
HCT: 44.2 % (ref 39.0–52.0)
HEMOGLOBIN: 15 g/dL (ref 13.0–17.0)
LYMPHS ABS: 2.2 10*3/uL (ref 0.7–4.0)
LYMPHS PCT: 40.7 % (ref 12.0–46.0)
MCHC: 33.9 g/dL (ref 30.0–36.0)
MCV: 90.4 fl (ref 78.0–100.0)
MONO ABS: 0.5 10*3/uL (ref 0.1–1.0)
MONOS PCT: 8.5 % (ref 3.0–12.0)
NEUTROS PCT: 42.8 % — AB (ref 43.0–77.0)
Neutro Abs: 2.3 10*3/uL (ref 1.4–7.7)
Platelets: 154 10*3/uL (ref 150.0–400.0)
RBC: 4.89 Mil/uL (ref 4.22–5.81)
RDW: 13.7 % (ref 11.5–15.5)
WBC: 5.5 10*3/uL (ref 4.0–10.5)

## 2017-05-26 LAB — URINALYSIS
BILIRUBIN URINE: NEGATIVE
HGB URINE DIPSTICK: NEGATIVE
Ketones, ur: NEGATIVE
LEUKOCYTES UA: NEGATIVE
NITRITE: NEGATIVE
Specific Gravity, Urine: 1.02 (ref 1.000–1.030)
TOTAL PROTEIN, URINE-UPE24: NEGATIVE
UROBILINOGEN UA: 0.2 (ref 0.0–1.0)
Urine Glucose: NEGATIVE
pH: 6 (ref 5.0–8.0)

## 2017-05-26 LAB — BASIC METABOLIC PANEL
BUN: 21 mg/dL (ref 6–23)
CO2: 29 mEq/L (ref 19–32)
Calcium: 9.2 mg/dL (ref 8.4–10.5)
Chloride: 102 mEq/L (ref 96–112)
Creatinine, Ser: 1.37 mg/dL (ref 0.40–1.50)
GFR: 55.19 mL/min — AB (ref >60.00)
Glucose, Bld: 105 mg/dL — ABNORMAL HIGH (ref 70–99)
POTASSIUM: 4.3 meq/L (ref 3.5–5.1)
SODIUM: 138 meq/L (ref 135–145)

## 2017-05-26 LAB — LIPID PANEL
CHOLESTEROL: 152 mg/dL (ref 0–200)
HDL: 34.9 mg/dL — ABNORMAL LOW (ref >39.00)
LDL CALC: 78 mg/dL (ref 0–99)
NonHDL: 116.82
TRIGLYCERIDES: 196 mg/dL — AB (ref 0.0–149.0)
Total CHOL/HDL Ratio: 4
VLDL: 39.2 mg/dL (ref 0.0–40.0)

## 2017-05-26 LAB — HEPATIC FUNCTION PANEL
ALBUMIN: 4.4 g/dL (ref 3.5–5.2)
ALT: 27 U/L (ref 0–53)
AST: 18 U/L (ref 0–37)
Alkaline Phosphatase: 46 U/L (ref 39–117)
Bilirubin, Direct: 0.1 mg/dL (ref 0.0–0.3)
TOTAL PROTEIN: 6.7 g/dL (ref 6.0–8.3)
Total Bilirubin: 0.5 mg/dL (ref 0.2–1.2)

## 2017-05-26 LAB — TSH: TSH: 2.57 u[IU]/mL (ref 0.35–4.50)

## 2017-05-26 LAB — PSA: PSA: 0.72 ng/mL (ref 0.10–4.00)

## 2017-05-26 MED ORDER — LOSARTAN POTASSIUM 100 MG PO TABS: 50.0000 mg | ORAL_TABLET | Freq: Every day | ORAL | 3 refills | Status: DC

## 2017-05-26 MED ORDER — ZOLPIDEM TARTRATE 10 MG PO TABS: 5.0000 mg | ORAL_TABLET | Freq: Every evening | ORAL | 1 refills | Status: DC | PRN

## 2017-05-26 MED ORDER — ZOSTER VAC RECOMB ADJUVANTED 50 MCG/0.5ML IM SUSR
0.5000 mL | Freq: Once | INTRAMUSCULAR | 1 refills | Status: AC
Start: 2017-05-26 — End: 2017-05-26

## 2017-05-26 MED ORDER — LEVOTHYROXINE SODIUM 75 MCG PO TABS: 75.0000 ug | ORAL_TABLET | Freq: Every day | ORAL | 3 refills | Status: DC

## 2017-05-26 NOTE — Progress Notes (Signed)
Subjective:  Patient ID: Adrian [name], male    DOB: 12/08/1950  Age: 66 y.o. MRN: [id number]  CC: No chief complaint on file.   HPI Adrian [name] presents for a well exam  Outpatient Medications Prior to Visit  Medication Sig Dispense Refill  . amoxicillin-clavulanate (AUGMENTIN) 875-125 MG tablet Take 1 tablet by mouth 2 (two) times daily. 20 tablet 0  . aspirin 81 MG tablet Take 1 tablet (81 mg total) by mouth at bedtime. 30 tablet 11  . chlorpheniramine-HYDROcodone (TUSSIONEX PENNKINETIC ER) 10-8 MG/5ML SUER Take 5 mLs by mouth at bedtime as needed for cough. 115 mL 0  . gentamicin ointment (GARAMYCIN) 0.1 % APPLY  THREE TIMES DAILY 30 g 2  . guaiFENesin (MUCINEX) 600 MG 12 hr tablet Take 1 tablet (600 mg total) by mouth 2 (two) times daily as needed for cough or to loosen phlegm. 14 tablet 0  . halobetasol (ULTRAVATE) 0.05 % cream APPLY  CREAM TOPICALLY TWICE DAILY AS NEEDED 50 g 0  . loratadine (CLARITIN) 10 MG tablet Take 10 mg by mouth daily.      . Magnesium 100 MG CAPS Take 1 capsule by mouth daily.    . Melatonin 10 MG TABS Take 1 each by mouth at bedtime. Takes 3 mg at bedtime    . Misc Natural Products (TART CHERRY ADVANCED) CAPS Take by mouth daily.    . Multiple Vitamins-Minerals (CENTRUM SILVER ADULT 50+ PO) Take 1 tablet by mouth at bedtime.    . Probiotic Product (PROBIOTIC DAILY PO) Take 1 tablet by mouth at bedtime.    . TURMERIC PO Take by mouth daily.    Marland Kitchen VALERIAN ROOT PO Take 1,500 mg by mouth at bedtime.    . Vitamin D, Ergocalciferol, (DRISDOL) 50000 units CAPS capsule TAKE ONE CAPSULE BY MOUTH EVERY 7 DAYS 12 capsule 0  . Zinc 50 MG TABS Take 1 tablet by mouth every morning.      Marland Kitchen levothyroxine (SYNTHROID, LEVOTHROID) 75 MCG tablet Take 1 tablet (75 mcg total) by mouth daily. 90 tablet 3  . losartan (COZAAR) 100 MG tablet TAKE ONE-HALF TABLET BY MOUTH ONCE DAILY 45 tablet 3  . zolpidem (AMBIEN) 10 MG tablet Take 0.5-1 tablets (5-10 mg total) by mouth at  bedtime as needed for sleep. 90 tablet 1   No facility-administered medications prior to visit.     ROS Review of Systems  Constitutional: Negative for appetite change, fatigue and unexpected weight change.  HENT: Negative for congestion, nosebleeds, sneezing, sore throat and trouble swallowing.   Eyes: Negative for itching and visual disturbance.  Respiratory: Negative for cough.   Cardiovascular: Negative for chest pain, palpitations and leg swelling.  Gastrointestinal: Negative for abdominal distention, blood in stool, diarrhea and nausea.  Genitourinary: Negative for frequency and hematuria.  Musculoskeletal: Negative for back pain, gait problem, joint swelling and neck pain.  Skin: Negative for rash.  Neurological: Negative for dizziness, tremors, speech difficulty and weakness.  Psychiatric/Behavioral: Negative for agitation, dysphoric mood and sleep disturbance. The patient is not nervous/anxious.     Objective:  BP 136/82 (BP Location: Left Arm, Patient Position: Sitting, Cuff Size: Large)   Pulse 62   Temp (!) 97.3 F (36.3 C) (Oral)   Ht 5\' 9"  (1.753 m)   Wt 215 lb (97.5 kg)   SpO2 99%   BMI 31.75 kg/m   BP Readings from Last 3 Encounters:  05/26/17 136/82  05/20/17 130/82  04/26/17 122/82    Wt  Readings from Last 3 Encounters:  05/26/17 215 lb (97.5 kg)  05/20/17 220 lb 0.6 oz (99.8 kg)  04/26/17 217 lb (98.4 kg)    Physical Exam  Constitutional: He is oriented to person, place, and time. He appears well-developed and well-nourished. No distress.  HENT:  Head: Normocephalic and atraumatic.  Right Ear: External ear normal.  Left Ear: External ear normal.  Nose: Nose normal.  Mouth/Throat: Oropharynx is clear and moist. No oropharyngeal exudate.  Eyes: Conjunctivae and EOM are normal. Pupils are equal, round, and reactive to light. Right eye exhibits no discharge. Left eye exhibits no discharge. No scleral icterus.  Neck: Normal range of motion. Neck  supple. No JVD present. No tracheal deviation present. No thyromegaly present.  Cardiovascular: Normal rate, regular rhythm, normal heart sounds and intact distal pulses. Exam reveals no gallop and no friction rub.  No murmur heard. Pulmonary/Chest: Effort normal and breath sounds normal. No stridor. No respiratory distress. He has no wheezes. He has no rales. He exhibits no tenderness.  Abdominal: Soft. Bowel sounds are normal. He exhibits no distension and no mass. There is no tenderness. There is no rebound and no guarding.  Genitourinary: Rectum normal, prostate normal and penis normal. Rectal exam shows guaiac negative stool. No penile tenderness.  Musculoskeletal: Normal range of motion. He exhibits no edema or tenderness.  Lymphadenopathy:    He has no cervical adenopathy.  Neurological: He is alert and oriented to person, place, and time. He has normal reflexes. No cranial nerve deficit. He exhibits normal muscle tone. Coordination normal.  Skin: Skin is warm and dry. No rash noted. He is not diaphoretic. No erythema. No pallor.  Psychiatric: He has a normal mood and affect. His behavior is normal. Judgment and thought content normal.  Pt declined rectal  Lab Results  Component Value Date   WBC 5.7 05/10/2016   HGB 14.4 05/10/2016   HCT 41.9 05/10/2016   PLT 144.0 (L) 05/10/2016   GLUCOSE 98 05/10/2016   CHOL 174 05/10/2016   TRIG 252.0 (H) 05/10/2016   HDL 40.20 05/10/2016   LDLDIRECT 66.0 05/10/2016   LDLCALC 77 04/10/2015   ALT 22 05/10/2016   AST 15 05/10/2016   NA 141 05/10/2016   K 4.8 05/10/2016   CL 102 05/10/2016   CREATININE 1.53 (H) 05/10/2016   BUN 23 05/10/2016   CO2 30 05/10/2016   TSH 4.06 05/10/2016   PSA 0.70 05/10/2016   INR 1.0 RATIO 09/18/2007   HGBA1C 5.6 12/04/2008    [place] Limited Joint Space Structures Low Right  Result Date: 04/15/2017 Limited musculoskeletal ultrasound was performed and interpreted by Lyndal Pulley Limited ultrasound of  patient's first metatarsal shows the patient does have what seems to be a capsulitis.  Patient does have significant vascularity in the area.  Seems to be somewhat abnormal.  Pulsatile. Impression: Capsulitis with abnormal vascularity.  Procedure: Real-time Ultrasound Guided Injection of right first MTP Device: GE Logiq Q7 Ultrasound guided injection is preferred based studies that show increased duration, increased effect, greater accuracy, decreased procedural pain, increased response rate, and decreased cost with ultrasound guided versus blind injection. Verbal informed consent obtained. Time-out conducted. Noted no overlying erythema, induration, or other signs of local infection. Skin prepped in a sterile fashion. Local anesthesia: Topical Ethyl chloride. With sterile technique and under real time ultrasound guidance: With a 25-gauge half inch needle injected with a total of 0.5 cc of 0.5% Marcaine and 0.5 cc of Kenalog 40 mg/mL. Completed without  difficulty Pain immediately resolved suggesting accurate placement of the medication. Advised to call if fevers/chills, erythema, induration, drainage, or persistent bleeding. Images permanently stored and available for review in the ultrasound unit. Impression: Technically successful ultrasound guided injection.  Dg Foot Complete Right  Result Date: 04/11/2017 CLINICAL DATA:  Right toe pain 5 days no injury EXAM: RIGHT FOOT COMPLETE - 3+ VIEW COMPARISON:  None. FINDINGS: Advanced degenerative change in the first metatarsal phalangeal joint with joint space narrowing and spurring. No erosion. Negative for fracture. No other arthropathy. 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 Electronically Signed   By: Franchot Gallo M.D.   On: 04/11/2017 13:40    Assessment & Plan:   There are no diagnoses linked to this encounter. I have changed Hardin Negus. Scrivener's losartan. I am also having him maintain his loratadine, Melatonin,  Zinc, Multiple Vitamins-Minerals (CENTRUM SILVER ADULT 50+ PO), Probiotic Product (PROBIOTIC DAILY PO), VALERIAN ROOT PO, aspirin, Magnesium, gentamicin ointment, guaiFENesin, TART CHERRY ADVANCED, TURMERIC PO, Vitamin D (Ergocalciferol), amoxicillin-clavulanate, chlorpheniramine-HYDROcodone, halobetasol, levothyroxine, and zolpidem.  Meds ordered this encounter  Medications  . levothyroxine (SYNTHROID, LEVOTHROID) 75 MCG tablet    Sig: Take 1 tablet (75 mcg total) by mouth daily.    Dispense:  90 tablet    Refill:  3  . losartan (COZAAR) 100 MG tablet    Sig: Take 0.5 tablets (50 mg total) by mouth daily.    Dispense:  45 tablet    Refill:  3  . zolpidem (AMBIEN) 10 MG tablet    Sig: Take 0.5-1 tablets (5-10 mg total) by mouth at bedtime as needed for sleep.    Dispense:  90 tablet    Refill:  1     Follow-up: No Follow-up on file.  Walker Kehr, MD

## 2017-05-26 NOTE — Assessment & Plan Note (Signed)
Labs On Rx 

## 2017-05-26 NOTE — Assessment & Plan Note (Signed)
Labs

## 2017-05-26 NOTE — Assessment & Plan Note (Signed)
Losartan Labs 

## 2017-05-26 NOTE — Patient Instructions (Signed)

## 2017-05-26 NOTE — Assessment & Plan Note (Signed)

## 2017-05-26 NOTE — Assessment & Plan Note (Signed)
On B12 

## 2017-07-11 ENCOUNTER — Other Ambulatory Visit: Payer: Self-pay | Admitting: Family Medicine

## 2017-07-19 ENCOUNTER — Ambulatory Visit: Payer: Medicare Other | Admitting: Internal Medicine

## 2017-07-19 ENCOUNTER — Encounter: Payer: Self-pay | Admitting: Internal Medicine

## 2017-07-19 DIAGNOSIS — I10 Essential (primary) hypertension: Secondary | ICD-10-CM

## 2017-07-19 DIAGNOSIS — E034 Atrophy of thyroid (acquired): Secondary | ICD-10-CM | POA: Diagnosis not present

## 2017-07-19 DIAGNOSIS — R251 Tremor, unspecified: Secondary | ICD-10-CM | POA: Diagnosis not present

## 2017-07-19 DIAGNOSIS — E538 Deficiency of other specified B group vitamins: Secondary | ICD-10-CM

## 2017-07-19 NOTE — Assessment & Plan Note (Addendum)
Unclear etiology.  The patient is concerned no family history of Parkinson's disease - will ref to Dr Tat for a consultation

## 2017-07-19 NOTE — Assessment & Plan Note (Signed)
Recent TSH was normal 

## 2017-07-19 NOTE — Assessment & Plan Note (Addendum)
On B12 in the MVI

## 2017-07-19 NOTE — Progress Notes (Signed)
Subjective:  Patient ID: Adrian [name], male    DOB: [birth year]  Age: 67 y.o. MRN: [id number]  CC: No chief complaint on file.   HPI Adrian [name] presents for shaking  C/o L leg and L hand trembling - off and on x 2 month. No weakness. Sister is 83 years older was dx'd w/Parkinson's  Outpatient Medications Prior to Visit  Medication Sig Dispense Refill  . aspirin 81 MG tablet Take 1 tablet (81 mg total) by mouth at bedtime. 30 tablet 11  . fluticasone (FLONASE) 50 MCG/ACT nasal spray Place into both nostrils daily.    Marland Kitchen gentamicin ointment (GARAMYCIN) 0.1 % APPLY  THREE TIMES DAILY 30 g 2  . halobetasol (ULTRAVATE) 0.05 % cream APPLY  CREAM TOPICALLY TWICE DAILY AS NEEDED 50 g 0  . levothyroxine (SYNTHROID, LEVOTHROID) 75 MCG tablet Take 1 tablet (75 mcg total) by mouth daily. 90 tablet 3  . loratadine (CLARITIN) 10 MG tablet Take 10 mg by mouth daily.      Marland Kitchen losartan (COZAAR) 100 MG tablet Take 0.5 tablets (50 mg total) by mouth daily. 45 tablet 3  . Magnesium 100 MG CAPS Take 1 capsule by mouth daily.    . Melatonin 10 MG TABS Take 1 each by mouth at bedtime. Takes 3 mg at bedtime    . Misc Natural Products (TART CHERRY ADVANCED) CAPS Take by mouth daily.    . Multiple Vitamins-Minerals (CENTRUM SILVER ADULT 50+ PO) Take 1 tablet by mouth at bedtime.    . Probiotic Product (PROBIOTIC DAILY PO) Take 1 tablet by mouth at bedtime.    . TURMERIC PO Take by mouth daily.    Marland Kitchen VALERIAN ROOT PO Take 1,500 mg by mouth at bedtime.    . Vitamin D, Ergocalciferol, (DRISDOL) 50000 units CAPS capsule TAKE 1 CAPSULE BY MOUTH EVERY 7 DAYS 12 capsule 0  . Zinc 50 MG TABS Take 1 tablet by mouth every morning.      . zolpidem (AMBIEN) 10 MG tablet Take 0.5-1 tablets (5-10 mg total) by mouth at bedtime as needed for sleep. 90 tablet 1  . amoxicillin-clavulanate (AUGMENTIN) 875-125 MG tablet Take 1 tablet by mouth 2 (two) times daily. 20 tablet 0  . chlorpheniramine-HYDROcodone (TUSSIONEX  PENNKINETIC ER) 10-8 MG/5ML SUER Take 5 mLs by mouth at bedtime as needed for cough. 115 mL 0  . guaiFENesin (MUCINEX) 600 MG 12 hr tablet Take 1 tablet (600 mg total) by mouth 2 (two) times daily as needed for cough or to loosen phlegm. 14 tablet 0   No facility-administered medications prior to visit.     ROS Review of Systems  Constitutional: Negative for appetite change, fatigue and unexpected weight change.  HENT: Negative for congestion, nosebleeds, sneezing, sore throat and trouble swallowing.   Eyes: Negative for itching and visual disturbance.  Respiratory: Negative for cough.   Cardiovascular: Negative for chest pain, palpitations and leg swelling.  Gastrointestinal: Negative for abdominal distention, blood in stool, diarrhea and nausea.  Genitourinary: Negative for frequency and hematuria.  Musculoskeletal: Negative for back pain, gait problem, joint swelling and neck pain.  Skin: Negative for rash.  Neurological: Positive for tremors. Negative for dizziness, speech difficulty, weakness, numbness and headaches.  Psychiatric/Behavioral: Negative for agitation, dysphoric mood and sleep disturbance. The patient is not nervous/anxious.     Objective:  BP 126/78 (BP Location: Left Arm, Patient Position: Sitting, Cuff Size: Large)   Pulse 62   Temp 98.7 F (37.1 C) (Oral)  Ht 5\' 9"  (1.753 m)   Wt 215 lb (97.5 kg)   SpO2 98%   BMI 31.75 kg/m   BP Readings from Last 3 Encounters:  07/19/17 126/78  05/26/17 136/82  05/20/17 130/82    Wt Readings from Last 3 Encounters:  07/19/17 215 lb (97.5 kg)  05/26/17 215 lb (97.5 kg)  05/20/17 220 lb 0.6 oz (99.8 kg)    Physical Exam  Constitutional: He is oriented to person, place, and time. He appears well-developed. No distress.  NAD  HENT:  Mouth/Throat: Oropharynx is clear and moist.  Eyes: Conjunctivae are normal. Pupils are equal, round, and reactive to light.  Neck: Normal range of motion. No JVD present. No  thyromegaly present.  Cardiovascular: Normal rate, regular rhythm, normal heart sounds and intact distal pulses. Exam reveals no gallop and no friction rub.  No murmur heard. Pulmonary/Chest: Effort normal and breath sounds normal. No respiratory distress. He has no wheezes. He has no rales. He exhibits no tenderness.  Abdominal: Soft. Bowel sounds are normal. He exhibits no distension and no mass. There is no tenderness. There is no rebound and no guarding.  Musculoskeletal: Normal range of motion. He exhibits no edema or tenderness.  Lymphadenopathy:    He has no cervical adenopathy.  Neurological: He is alert and oriented to person, place, and time. He has normal reflexes. No cranial nerve deficit. He exhibits normal muscle tone. He displays a negative Romberg sign. Coordination and gait normal.  Skin: Skin is warm and dry. No rash noted.  Psychiatric: He has a normal mood and affect. His behavior is normal. Judgment and thought content normal.      L hand and LLE tremor x 2 months Recent TSH was OK    Lab Results  Component Value Date   WBC 5.5 05/26/2017   HGB 15.0 05/26/2017   HCT 44.2 05/26/2017   PLT 154.0 05/26/2017   GLUCOSE 105 (H) 05/26/2017   CHOL 152 05/26/2017   TRIG 196.0 (H) 05/26/2017   HDL 34.90 (L) 05/26/2017   LDLDIRECT 66.0 05/10/2016   LDLCALC 78 05/26/2017   ALT 27 05/26/2017   AST 18 05/26/2017   NA 138 05/26/2017   K 4.3 05/26/2017   CL 102 05/26/2017   CREATININE 1.37 05/26/2017   BUN 21 05/26/2017   CO2 29 05/26/2017   TSH 2.57 05/26/2017   PSA 0.72 05/26/2017   INR 1.0 RATIO 09/18/2007   HGBA1C 5.6 12/04/2008    [place] Limited Joint Space Structures Low Right  Result Date: 04/15/2017 Limited musculoskeletal ultrasound was performed and interpreted by Lyndal Pulley Limited ultrasound of patient's first metatarsal shows the patient does have what seems to be a capsulitis.  Patient does have significant vascularity in the area.  Seems to be  somewhat abnormal.  Pulsatile. Impression: Capsulitis with abnormal vascularity.  Procedure: Real-time Ultrasound Guided Injection of right first MTP Device: GE Logiq Q7 Ultrasound guided injection is preferred based studies that show increased duration, increased effect, greater accuracy, decreased procedural pain, increased response rate, and decreased cost with ultrasound guided versus blind injection. Verbal informed consent obtained. Time-out conducted. Noted no overlying erythema, induration, or other signs of local infection. Skin prepped in a sterile fashion. Local anesthesia: Topical Ethyl chloride. With sterile technique and under real time ultrasound guidance: With a 25-gauge half inch needle injected with a total of 0.5 cc of 0.5% Marcaine and 0.5 cc of Kenalog 40 mg/mL. Completed without difficulty Pain immediately resolved suggesting accurate placement of  the medication. Advised to call if fevers/chills, erythema, induration, drainage, or persistent bleeding. Images permanently stored and available for review in the ultrasound unit. Impression: Technically successful ultrasound guided injection.  Dg Foot Complete Right  Result Date: 04/11/2017 CLINICAL DATA:  Right toe pain 5 days no injury EXAM: RIGHT FOOT COMPLETE - 3+ VIEW COMPARISON:  None. FINDINGS: Advanced degenerative change in the first metatarsal phalangeal joint with joint space narrowing and spurring. No erosion. Negative for fracture. No other arthropathy. 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 Electronically Signed   By: Franchot Gallo M.D.   On: 04/11/2017 13:40    Assessment & Plan:   There are no diagnoses linked to this encounter. I have discontinued Hardin Negus. Romig's guaiFENesin, amoxicillin-clavulanate, and chlorpheniramine-HYDROcodone. I am also having him maintain his loratadine, Melatonin, Zinc, Multiple Vitamins-Minerals (CENTRUM SILVER ADULT 50+ PO), Probiotic Product  (PROBIOTIC DAILY PO), VALERIAN ROOT PO, aspirin, Magnesium, gentamicin ointment, TART CHERRY ADVANCED, TURMERIC PO, halobetasol, levothyroxine, losartan, zolpidem, Vitamin D (Ergocalciferol), and fluticasone.  No orders of the defined types were placed in this encounter.    Follow-up: No Follow-up on file.  Walker Kehr, MD

## 2017-07-19 NOTE — Assessment & Plan Note (Signed)
On losartan

## 2017-07-19 NOTE — Patient Instructions (Signed)
Adrian [name] ([email or web address]) Yoga/TRE - Trauma release exercise

## 2017-07-20 ENCOUNTER — Encounter: Payer: Self-pay | Admitting: Neurology

## 2017-08-04 NOTE — Progress Notes (Signed)
Adrian [name] was seen today in the movement disorders clinic for neurologic consultation at the request of Plotnikov, Evie Lacks, MD.  The consultation is for the evaluation of tremor x 2 months.  The records that were made available to me were reviewed.  He is accompanied by his wife who supplements the history.   Specific Symptoms:  Tremor: Yes.  , mostly L leg.  Worse with leg crossed.  It is worse later in the day than earlier in day.  Only at rest.  Notes some tremor of the L hand with fine motor movement.  He doesn't drink much caffeine/EtOH so doesn't know effect on tremor.  Anxiety increases it.  He is R hand dominant.   Family hx of similar:  Yes.  , sister with PD.  Sister is 32 years older.   Voice: little softer Sleep: sleeps well but takes zolpidem  Vivid Dreams:  rarely  Acting out dreams:  No. Wet Pillows: No. Postural symptoms:  Yes.    Falls?  No. Bradykinesia symptoms: difficulty getting out of a chair and shorter stride length Loss of smell:  Yes.  , just a little Loss of taste:  Yes.   Urinary Incontinence:  No. Difficulty Swallowing:  No. Handwriting, micrographia: No. Trouble with ADL's:  No.  Trouble buttoning clothing: No. Depression:  No. Memory changes:  No. N/V:  No. Lightheaded:  No.  Syncope: No. Diplopia:  No. Dyskinesia:  No.   MRI of the brain was performed in 2009.  Had microvascular decompression for trigeminal neuralgia and MRI was performed prior to this.  Films are reviewed.  This examination was unremarkable.  PREVIOUS MEDICATIONS: none to date  ALLERGIES:   Allergies  Allergen Reactions  . Elavil [Amitriptyline Hcl]     constipation  . Tape Rash    Irritation  . Triple Antibiotic [Bacitracin-Neomycin-Polymyxin] Rash    CURRENT MEDICATIONS:  Outpatient Encounter Medications as of 08/09/2017  Medication Sig  . aspirin 81 MG tablet Take 1 tablet (81 mg total) by mouth at bedtime.  . citalopram (CELEXA) 10 MG tablet Take 10 mg by  mouth daily.  . fexofenadine (ALLEGRA) 180 MG tablet Take 180 mg by mouth daily.  . fluticasone (FLONASE) 50 MCG/ACT nasal spray Place into both nostrils daily.  Marland Kitchen gentamicin ointment (GARAMYCIN) 0.1 % APPLY  THREE TIMES DAILY  . halobetasol (ULTRAVATE) 0.05 % cream APPLY  CREAM TOPICALLY TWICE DAILY AS NEEDED  . levothyroxine (SYNTHROID, LEVOTHROID) 75 MCG tablet Take 1 tablet (75 mcg total) by mouth daily.  Marland Kitchen losartan (COZAAR) 100 MG tablet Take 0.5 tablets (50 mg total) by mouth daily.  . Melatonin 10 MG TABS Take 1 each by mouth at bedtime. Takes 3 mg at bedtime  . Misc Natural Products (TART CHERRY ADVANCED) CAPS Take by mouth daily.  . Multiple Vitamins-Minerals (CENTRUM SILVER ADULT 50+ PO) Take 1 tablet by mouth at bedtime.  . Probiotic Product (PROBIOTIC DAILY PO) Take 1 tablet by mouth at bedtime.  . Pseudoeph-Doxylamine-DM-APAP (NYQUIL PO) Take by mouth as needed.  . TURMERIC PO Take by mouth daily.  Marland Kitchen VALERIAN ROOT PO Take 1,500 mg by mouth at bedtime.  . Vitamin D, Ergocalciferol, (DRISDOL) 50000 units CAPS capsule TAKE 1 CAPSULE BY MOUTH EVERY 7 DAYS  . Zinc 50 MG TABS Take 1 tablet by mouth every morning.    . zolpidem (AMBIEN) 10 MG tablet Take 0.5-1 tablets (5-10 mg total) by mouth at bedtime as needed for sleep.  . [  DISCONTINUED] loratadine (CLARITIN) 10 MG tablet Take 10 mg by mouth daily.    . [DISCONTINUED] Magnesium 100 MG CAPS Take 1 capsule by mouth daily.   No facility-administered encounter medications on file as of 08/09/2017.     PAST MEDICAL HISTORY:   Past Medical History:  Diagnosis Date  . Allergic rhinitis   . Cancer ([place]) 2016   basal cell skin cancer nose  . Cataract   . GERD (gastroesophageal reflux disease)   . Hyperlipidemia   . Hypertension   . Hypothyroidism 2010   mild  . Hypothyroidism   . Trigeminal neuralgia    L.  . Vitamin B12 deficiency 2010    PAST SURGICAL HISTORY:   Past Surgical History:  Procedure Laterality Date  .  arthroscopic surgery for chondromalacia Left 1995  . CATARACT EXTRACTION W/ INTRAOCULAR LENS  IMPLANT, BILATERAL  2017  . COLONOSCOPY    . gamma knife surgery for trigeminal neuralgia  2005  . inguinal herniorrhahpy  1991   L.  Marland Kitchen POLYPECTOMY    . TONSILLECTOMY  1959  . TRIGEMINAL NERVE DECOMPRESSION  2008   Left  Dr. Claybon Jabs    SOCIAL HISTORY:   Social History   Socioeconomic History  . Marital status: Married    Spouse name: Not on file  . Number of children: Not on file  . Years of education: Not on file  . Highest education level: Not on file  Social Needs  . Financial resource strain: Not on file  . Food insecurity - worry: Not on file  . Food insecurity - inability: Not on file  . Transportation needs - medical: Not on file  . Transportation needs - non-medical: Not on file  Occupational History  . 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 Smoking status: Former Smoker    Last attempt to quit: 05/31/1992    Years since quitting: 25.2  . Smokeless tobacco: Never Used  Substance and Sexual Activity  . Alcohol use: Yes    Alcohol/week: 4.2 oz    Types: 7 Glasses of wine per week    Comment: at least one drink per night; sometimes "we split a bottle of wine"  . Drug use: No  . Sexual activity: Yes  Other Topics Concern  . Not on file  Social History Narrative   UCD   Married x2: 12 years 40st; 15 years ('68) 2nd   One daughter, step daugfhter, step son   Likes to bike    FAMILY HISTORY:   Family Status  Relation Name Status  . Mother  Deceased  . Father  Deceased  . Sister  Alive  . Brother  Deceased  . Daughter  Alive  . Other 2 step children Alive  . Neg Hx  (Not Specified)    ROS:  A complete 10 system review of systems was obtained and was unremarkable apart from what is mentioned above.  PHYSICAL EXAMINATION:    VITALS:   Vitals:   08/09/17 0838  BP: 120/88  Pulse: (!) 52  SpO2: 96%  Weight: 220 lb (99.8  kg)  Height: 5\' 10"  (1.778 m)    GEN:  The patient appears stated age and is in NAD. HEENT:  Normocephalic, atraumatic.  The mucous membranes are moist. The superficial temporal arteries are without ropiness or tenderness. CV: brady.  regular Lungs:  CTAB Neck/HEME:  There are no carotid bruits bilaterally.  Neurological examination:  Orientation: The patient is  alert and oriented x3. Fund of knowledge is appropriate.  Recent and remote memory are intact.  Attention and concentration are normal.    Able to name objects and repeat phrases. Cranial nerves: There is good facial symmetry. Pupils are equal round and reactive to light bilaterally. Fundoscopic exam reveals clear margins bilaterally. Extraocular muscles are intact. The visual fields are full to confrontational testing. The speech is fluent and clear. Soft palate rises symmetrically and there is no tongue deviation. Hearing is intact to conversational tone. Sensation: Sensation is intact to light and pinprick throughout (facial, trunk, extremities). Vibration is intact at the bilateral big toe but it is decreased. There is no extinction with double simultaneous stimulation. There is no sensory dermatomal level identified. Motor: Strength is 5/5 in the bilateral upper and lower extremities.   Shoulder shrug is equal and symmetric.  There is no pronator drift. Deep tendon reflexes: Deep tendon reflexes are 2/4 at the bilateral biceps, triceps, brachioradialis, patella and achilles. Plantar responses are downgoing bilaterally.  Movement examination: Tone: There is normal tone in the bilateral upper extremities.  The tone in the lower extremities is normal.  Abnormal movements: There is bilateral LE resting tremor with distraction.  No UE resting tremor or intention tremor Coordination:  There is no decremation with RAM's, with any form of RAMS, including alternating supination and pronation of the forearm, hand opening and closing, finger  taps, heel taps and toe taps. Gait and Station: The patient has no difficulty arising out of a deep-seated chair without the use of the hands. The patient's stride length is normal.  The patient has a negative pull test.      Labs:  Lab Results  Component Value Date   TSH 2.57 05/26/2017     Chemistry      Component Value Date/Time   NA 138 05/26/2017 1020   K 4.3 05/26/2017 1020   CL 102 05/26/2017 1020   CO2 29 05/26/2017 1020   BUN 21 05/26/2017 1020   CREATININE 1.37 05/26/2017 1020      Component Value Date/Time   CALCIUM 9.2 05/26/2017 1020   ALKPHOS 46 05/26/2017 1020   AST 18 05/26/2017 1020   ALT 27 05/26/2017 1020   BILITOT 0.5 05/26/2017 1020     Lab Results  Component Value Date   VITAMINB12 650 03/23/2013     ASSESSMENT/PLAN:  1.  Resting tremor  -tremor is present in both legs, but patient only notes it in the LLE.  He occasionally notices intention tremor in the left upper extremity.  I saw no evidence of pathology today and he does not meet criteria for the [place] brain bank criteria for Parkinson's disease.  However, I did tell him that does not mean that this is not developing and could still happen in the future.  He was exposed to many pesticides as a child.  In addition, his sister has Parkinson's disease.  For these reasons, we decided to follow him every 6-8 months to make sure that he is not developing something.  We did talk about medications for tremor as well as the risks and benefits, and ultimately decided that the benefits outweigh the risks in his case, given that tremor is fairly mild, intermittent and in the left leg.  -I will check his serum ceruloplasmin and urinary copper today to make sure we are not missing anything.  We will check his B12.  2.  History of left trigeminal neuralgia  -Resolved after decompression surgery (had  gamma knife prior to that).  3.  Follow-up with me will be in the next 6-8 months, sooner should new neurologic issues  arise.  In the meantime, the importance of exercising and staying active was discussed.  Safety discussed.  Much greater than 50% of this visit was spent in counseling and coordinating care.  Total face to face time:  45 min  Cc:  Plotnikov, Evie Lacks, MD

## 2017-08-09 ENCOUNTER — Encounter: Payer: Self-pay | Admitting: Neurology

## 2017-08-09 ENCOUNTER — Ambulatory Visit: Payer: Medicare Other | Admitting: Neurology

## 2017-08-09 ENCOUNTER — Other Ambulatory Visit: Payer: Medicare Other

## 2017-08-09 VITALS — BP 120/88 | HR 52 | Ht 70.0 in | Wt 220.0 lb

## 2017-08-09 DIAGNOSIS — E538 Deficiency of other specified B group vitamins: Secondary | ICD-10-CM | POA: Diagnosis not present

## 2017-08-09 DIAGNOSIS — R251 Tremor, unspecified: Secondary | ICD-10-CM

## 2017-08-09 DIAGNOSIS — Z8669 Personal history of other diseases of the nervous system and sense organs: Secondary | ICD-10-CM | POA: Diagnosis not present

## 2017-08-09 DIAGNOSIS — R6889 Other general symptoms and signs: Secondary | ICD-10-CM | POA: Diagnosis not present

## 2017-08-09 NOTE — Patient Instructions (Signed)
1. Your provider has requested that you have labwork completed today. Please go to [facility] ([id number]) on the second floor of this building before leaving the office today. You do not need to check in. If you are not called within 15 minutes please check with the front desk.   

## 2017-08-10 LAB — CERULOPLASMIN: Ceruloplasmin: 28 mg/dL (ref 18–36)

## 2017-08-10 LAB — VITAMIN B12: Vitamin B-12: 491 pg/mL (ref 200–1100)

## 2017-08-11 ENCOUNTER — Telehealth: Payer: Self-pay | Admitting: Neurology

## 2017-08-11 LAB — COPPER, URINE - RANDOM OR 24 HOUR
COPPER / CREATININE RATIO: 12 ug/g{creat} (ref 0–49)
COPPER UR: 14 ug/L
CREATININE(CRT), U: 1.13 g/L (ref 0.30–3.00)

## 2017-08-11 NOTE — Telephone Encounter (Signed)
Mychart message sent to patient.

## 2017-08-11 NOTE — Telephone Encounter (Signed)
-----   Message from [place], DO sent at 08/11/2017  8:58 AM EDT ----- I have reviewed all lab results which are normal or stable. Please inform the patient.

## 2017-08-26 ENCOUNTER — Encounter: Payer: Self-pay | Admitting: Neurology

## 2017-08-28 NOTE — Progress Notes (Signed)
[name] [address] [place], [place] [address] Phone: [phone] Subjective:    CC: Foot pain follow-up  [id number]  Adrian [name] is a 67 y.o. male coming in with complaint of foot pain.  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  Given injection April 11, 2017.  Patient was doing significantly better.  Now started having increasing discomfort again.  Affecting daily activities such as walking.  He does have a long bike ride coming up in September. Patient is having the exact same pain that did respond well to the injection previously.  xrays taken April 11, 2017 showed the patient does have severe degenerative changes of the first metatarsal.  Independently visualized by me.   Strong family history of Parkinson's and sister.  Past Medical History:  Diagnosis Date  . Allergic rhinitis   . Cancer ([place]) 2016   basal cell skin cancer nose  . Cataract   . GERD (gastroesophageal reflux disease)   . Hyperlipidemia   . Hypertension   . Hypothyroidism 2010   mild  . Hypothyroidism   . Trigeminal neuralgia    L.  . Vitamin B12 deficiency 2010   Past Surgical History:  Procedure Laterality Date  . arthroscopic surgery for chondromalacia Left 1995  . CATARACT EXTRACTION W/ INTRAOCULAR LENS  IMPLANT, BILATERAL  2017  . COLONOSCOPY    . gamma knife surgery for trigeminal neuralgia  2005  . inguinal herniorrhahpy  1991   L.  Marland Kitchen POLYPECTOMY    . TONSILLECTOMY  1959  . TRIGEMINAL NERVE DECOMPRESSION  2008   Left  Dr. Claybon Jabs   Social History   Socioeconomic History  . Marital status: Married    Spouse name: Not on file  . Number of children: Not on file  . Years of education: Not on file  . Highest education level: Not on file  Occupational History  . Occupation: retired    Comment: TEFL teacher  Social Needs  . Financial resource strain: Not on file  . Food insecurity:    Worry: Not on file   Inability: Not on file  . Transportation needs:    Medical: Not on file    Non-medical: Not on file  Tobacco Use  . Smoking status: Former Smoker    Last attempt to quit: 05/31/1992    Years since quitting: 25.2  . Smokeless tobacco: Never Used  Substance and Sexual Activity  . Alcohol use: Yes    Alcohol/week: 4.2 oz    Types: 7 Glasses of wine per week    Comment: at least one drink per night; sometimes "we split a bottle of wine"  . Drug use: No  . Sexual activity: Yes  Lifestyle  . Physical activity:    Days per week: Not on file    Minutes per session: Not on file  . Stress: Not on file  Relationships  . Social connections:    Talks on phone: Not on file    Gets together: Not on file    Attends religious service: Not on file    Active member of club or organization: Not on file    Attends meetings of clubs or organizations: Not on file    Relationship status: Not on file  Other Topics Concern  . Not on file  Social History Narrative   UCD   Married x2: 12 years 50st; 15 years ('54) 2nd   One daughter, step daugfhter, step son   Laverle Hobby  to bike   Allergies  Allergen Reactions  . Elavil [Amitriptyline Hcl]     constipation  . Tape Rash    Irritation  . Triple Antibiotic [Bacitracin-Neomycin-Polymyxin] Rash   Family History  Problem Relation Age of Onset  . Heart disease Mother 77       MI  . Diabetes Mother   . Prostate cancer Father 52       prostate  . Breast cancer Sister 21       breast ca  . Stroke Brother   . Colon cancer Neg Hx   . Esophageal cancer Neg Hx   . Rectal cancer Neg Hx   . Stomach cancer Neg Hx      Past medical history, social, surgical and family history all reviewed in electronic medical record.  No pertanent information unless stated regarding to the chief complaint.   Review of Systems:Review of systems updated and as accurate as of 08/29/17  No headache, visual changes, nausea, vomiting, diarrhea, constipation, dizziness,  abdominal pain, skin rash, fevers, chills, night sweats, weight loss, swollen lymph nodes, body aches, joint swelling, muscle aches, chest pain, shortness of breath, mood changes.   Objective  Blood pressure 130/88, pulse (!) 58, height 5\' 10"  (1.778 m), weight 217 lb (98.4 kg), SpO2 97 %. Systems examined below as of 08/29/17   General: No apparent distress alert and oriented x3 mood and affect normal, dressed appropriately.  HEENT: Pupils equal, extraocular movements intact  Respiratory: Patient's speak in full sentences and does not appear short of breath  Cardiovascular: No lower extremity edema, non tender, no erythema  Skin: Warm dry intact with no signs of infection or rash on extremities or on axial skeleton.  Abdomen: Soft nontender  Neuro: Cranial nerves II through XII are intact, neurovascularly intact in all extremities with 2+ DTRs and 2+ pulses.  Lymph: No lymphadenopathy of posterior or anterior cervical chain or axillae bilaterally.  Gait antalgic MSK:  Non tender with full range of motion and good stability and symmetric strength and tone of shoulders, elbows, wrist, hip, knee and ankles bilaterally.  Foot exam shows the patient does have breakdown of the transverse arch.  Severe redness and swelling over the MTP on the right foot.  Mild limited range of motion likely more secondary to pain as well as the underlying arthritis with hallux limitus.  Procedure: Real-time Ultrasound Guided Injection of right first MTP Device: GE Logiq Q7 Ultrasound guided injection is preferred based studies that show increased duration, increased effect, greater accuracy, decreased procedural pain, increased response rate, and decreased cost with ultrasound guided versus blind injection.  Verbal informed consent obtained.  Time-out conducted.  Noted no overlying erythema, induration, or other signs of local infection.  Skin prepped in a sterile fashion.  Local anesthesia: Topical Ethyl  chloride.  With sterile technique and under real time ultrasound guidance: With a 25-gauge half inch needle was injected with 0.5 cc of 0.5% Marcaine and 0.5 cc of Kenalog 40 mg/mL Completed without difficulty  Pain immediately resolved suggesting accurate placement of the medication.  Advised to call if fevers/chills, erythema, induration, drainage, or persistent bleeding.  Images permanently stored and available for review in the ultrasound unit.  Impression: Technically successful ultrasound guided injection.    Impression and Recommendations:     This case required medical decision making of moderate complexity.      Note: This dictation was prepared with Dragon dictation along with smaller phrase technology. Any transcriptional errors that result  from this process are unintentional.

## 2017-08-29 ENCOUNTER — Ambulatory Visit: Payer: Medicare Other | Admitting: Family Medicine

## 2017-08-29 ENCOUNTER — Ambulatory Visit: Payer: Self-pay

## 2017-08-29 ENCOUNTER — Encounter: Payer: Self-pay | Admitting: Family Medicine

## 2017-08-29 VITALS — BP 130/88 | HR 58 | Ht 70.0 in | Wt 217.0 lb

## 2017-08-29 DIAGNOSIS — M19079 Primary osteoarthritis, unspecified ankle and foot: Secondary | ICD-10-CM

## 2017-08-29 DIAGNOSIS — M79675 Pain in left toe(s): Secondary | ICD-10-CM

## 2017-08-29 NOTE — Patient Instructions (Signed)
Good to see you  Can repeat every 10 months if needed You should do well  pennsaid pinkie amount topically 2 times daily as needed.   Continue the vitamin D  See me again in 10 weeks [phone]

## 2017-08-29 NOTE — Assessment & Plan Note (Signed)
Injected today  Discussed the orthotics.  Discussed shoes Can repeat injeciton every 10 weeks if needed RTC in 10 week s

## 2017-10-06 ENCOUNTER — Encounter: Payer: Self-pay | Admitting: Internal Medicine

## 2017-10-11 ENCOUNTER — Other Ambulatory Visit: Payer: Self-pay | Admitting: Internal Medicine

## 2017-10-11 MED ORDER — ZOLPIDEM TARTRATE ER 12.5 MG PO TBCR: 12.5000 mg | EXTENDED_RELEASE_TABLET | Freq: Every evening | ORAL | 1 refills | Status: DC | PRN

## 2017-10-13 ENCOUNTER — Telehealth: Payer: Self-pay

## 2017-10-13 NOTE — Telephone Encounter (Signed)
Key: [facility]  Approved today via Cover my Meds

## 2017-10-13 NOTE — Telephone Encounter (Signed)
Key: [id number]   Started today via Cover My Meds.

## 2017-10-18 ENCOUNTER — Other Ambulatory Visit: Payer: Self-pay | Admitting: Family Medicine

## 2017-12-12 NOTE — Progress Notes (Signed)
[name] [address] [place], [place] [address] Phone: [phone] Subjective:   CC: Toe pain  KGY:[id number]  Adrian [name] is a 67 y.o. male coming in with complaint of right first toe pain.  Severe arthritis.  Worsening symptoms.  Affecting daily activities patient noticed no significant differences in shoes.    Past Medical History:  Diagnosis Date  . Allergic rhinitis   . Cancer ([place]) 2016   basal cell skin cancer nose  . Cataract   . GERD (gastroesophageal reflux disease)   . Hyperlipidemia   . Hypertension   . Hypothyroidism 2010   mild  . Hypothyroidism   . Trigeminal neuralgia    L.  . Vitamin B12 deficiency 2010   Past Surgical History:  Procedure Laterality Date  . arthroscopic surgery for chondromalacia Left 1995  . CATARACT EXTRACTION W/ INTRAOCULAR LENS  IMPLANT, BILATERAL  2017  . COLONOSCOPY    . gamma knife surgery for trigeminal neuralgia  2005  . inguinal herniorrhahpy  1991   L.  Marland Kitchen POLYPECTOMY    . TONSILLECTOMY  1959  . TRIGEMINAL NERVE DECOMPRESSION  2008   Left  Dr. Claybon Jabs   Social History   Socioeconomic History  . Marital status: Married    Spouse name: Not on file  . Number of children: Not on file  . Years of education: Not on file  . Highest education level: Not on file  Occupational History  . Occupation: retired    Comment: TEFL teacher  Social Needs  . Financial resource strain: Not on file  . Food insecurity:    Worry: Not on file    Inability: Not on file  . Transportation needs:    Medical: Not on file    Non-medical: Not on file  Tobacco Use  . Smoking status: Former Smoker    Last attempt to quit: 05/31/1992    Years since quitting: 25.5  . Smokeless tobacco: Never Used  Substance and Sexual Activity  . Alcohol use: Yes    Alcohol/week: 4.2 oz    Types: 7 Glasses of wine per week    Comment: at least one drink per night; sometimes "we split a bottle of wine"  .  Drug use: No  . Sexual activity: Yes  Lifestyle  . Physical activity:    Days per week: Not on file    Minutes per session: Not on file  . Stress: Not on file  Relationships  . Social connections:    Talks on phone: Not on file    Gets together: Not on file    Attends religious service: Not on file    Active member of club or organization: Not on file    Attends meetings of clubs or organizations: Not on file    Relationship status: Not on file  Other Topics Concern  . Not on file  Social History Narrative   UCD   Married x2: 12 years 52st; 15 years ('54) 2nd   One daughter, step daugfhter, step son   Likes to bike   Allergies  Allergen Reactions  . Elavil [Amitriptyline Hcl]     constipation  . Tape Rash    Irritation  . Triple Antibiotic [Bacitracin-Neomycin-Polymyxin] Rash   Family History  Problem Relation Age of Onset  . Heart disease Mother 3       MI  . Diabetes Mother   . Prostate cancer Father 67  prostate  . Breast cancer Sister 74       breast ca  . Stroke Brother   . Colon cancer Neg Hx   . Esophageal cancer Neg Hx   . Rectal cancer Neg Hx   . Stomach cancer Neg Hx      Past medical history, social, surgical and family history all reviewed in electronic medical record.  No pertanent information unless stated regarding to the chief complaint.   Review of Systems:Review of systems updated and as accurate as of 12/13/17  No headache, visual changes, nausea, vomiting, diarrhea, constipation, dizziness, abdominal pain, skin rash, fevers, chills, night sweats, weight loss, swollen lymph nodes, body aches, joint swelling, muscle aches, chest pain, shortness of breath, mood changes.   Objective  Blood pressure 140/82, pulse (!) 56, height 5\' 10"  (1.778 m), weight 211 lb (95.7 kg), SpO2 98 %. Systems examined below as of 12/13/17   General: No apparent distress alert and oriented x3 mood and affect normal, dressed appropriately.  HEENT: Pupils equal,  extraocular movements intact  Respiratory: Patient's speak in full sentences and does not appear short of breath  Cardiovascular: No lower extremity edema, non tender, no erythema  Skin: Warm dry intact with no signs of infection or rash on extremities or on axial skeleton.  Abdomen: Soft nontender  Neuro: Cranial nerves II through XII are intact, neurovascularly intact in all extremities with 2+ DTRs and 2+ pulses.  Lymph: No lymphadenopathy of posterior or anterior cervical chain or axillae bilaterally.  Gait antalgic gait MSK:  tender with full range of motion and good stability and symmetric strength and tone of shoulders, elbows, wrist, hip, knee and ankles bilaterally.  Arthritic changes Right foot exam shows the patient does have severe loss of motion of the MTP.  Swelling noted.   Procedure: Real-time Ultrasound Guided Injection of first metatarsal injection Device: GE Logiq Q7 Ultrasound guided injection is preferred based studies that show increased duration, increased effect, greater accuracy, decreased procedural pain, increased response rate, and decreased cost with ultrasound guided versus blind injection.  Verbal informed consent obtained.  Time-out conducted.  Noted no overlying erythema, induration, or other signs of local infection.  Skin prepped in a sterile fashion.  Local anesthesia: Topical Ethyl chloride.  With sterile technique and under real time ultrasound guidance: With a 25-gauge half inch needle injected with 0.5 cc of 0.5% Marcaine and 0.5 cc of Kenalog 40 mg/mL Completed without difficulty  Pain immediately resolved suggesting accurate placement of the medication.  Advised to call if fevers/chills, erythema, induration, drainage, or persistent bleeding.  Images permanently stored and available for review in the ultrasound unit.  Impression: Technically successful ultrasound guided injection. Impression and Recommendations:     This case required medical  decision making of moderate complexity.      Note: This dictation was prepared with Dragon dictation along with smaller phrase technology. Any transcriptional errors that result from this process are unintentional.

## 2017-12-13 ENCOUNTER — Encounter: Payer: Self-pay | Admitting: Family Medicine

## 2017-12-13 ENCOUNTER — Ambulatory Visit: Payer: Medicare Other | Admitting: Family Medicine

## 2017-12-13 ENCOUNTER — Ambulatory Visit: Payer: Self-pay

## 2017-12-13 VITALS — BP 140/82 | HR 56 | Ht 70.0 in | Wt 211.0 lb

## 2017-12-13 DIAGNOSIS — M79675 Pain in left toe(s): Secondary | ICD-10-CM | POA: Diagnosis not present

## 2017-12-13 DIAGNOSIS — M19071 Primary osteoarthritis, right ankle and foot: Secondary | ICD-10-CM | POA: Diagnosis not present

## 2017-12-13 DIAGNOSIS — M19079 Primary osteoarthritis, unspecified ankle and foot: Secondary | ICD-10-CM | POA: Diagnosis not present

## 2017-12-13 MED ORDER — GABAPENTIN 100 MG PO CAPS: 200.0000 mg | ORAL_CAPSULE | Freq: Every day | ORAL | 3 refills | Status: DC

## 2017-12-13 MED ORDER — TRAZODONE HCL 50 MG PO TABS: 25.0000 mg | ORAL_TABLET | Freq: Every evening | ORAL | 3 refills | Status: DC | PRN

## 2017-12-13 NOTE — Assessment & Plan Note (Signed)
Worsening symptoms.  Injected December 13, 2017.  Patient though has had repeat injection multiple times.  Will be sent for possible surgical intervention and referred to orthopedic surgery today.

## 2017-12-13 NOTE — Patient Instructions (Addendum)
I am sorry this is hurting so much  Appears to be horrible arthritis  Ice is your friend.  Rigid sole shoes will be good.  pennsaid pinkie amount topically 2 times daily as needed.  Avoid walking barefoot.  Only thing else would be custom orthotics.  See me again in 10 weeks

## 2017-12-29 ENCOUNTER — Encounter: Payer: Self-pay | Admitting: Family Medicine

## 2018-01-23 DIAGNOSIS — M2021 Hallux rigidus, right foot: Secondary | ICD-10-CM | POA: Diagnosis not present

## 2018-01-23 DIAGNOSIS — M79671 Pain in right foot: Secondary | ICD-10-CM | POA: Diagnosis not present

## 2018-02-21 ENCOUNTER — Ambulatory Visit: Payer: Medicare Other | Admitting: Family Medicine

## 2018-02-28 ENCOUNTER — Ambulatory Visit (INDEPENDENT_AMBULATORY_CARE_PROVIDER_SITE_OTHER): Payer: Medicare Other

## 2018-02-28 DIAGNOSIS — Z23 Encounter for immunization: Secondary | ICD-10-CM

## 2018-03-23 ENCOUNTER — Other Ambulatory Visit: Payer: Self-pay | Admitting: Family Medicine

## 2018-03-24 NOTE — Telephone Encounter (Signed)
Refill done.  

## 2018-03-30 ENCOUNTER — Encounter: Payer: Self-pay | Admitting: Family Medicine

## 2018-04-07 ENCOUNTER — Encounter: Payer: Self-pay | Admitting: Family Medicine

## 2018-04-07 ENCOUNTER — Other Ambulatory Visit: Payer: Self-pay | Admitting: Family Medicine

## 2018-04-07 NOTE — Telephone Encounter (Signed)
Refill done.  

## 2018-04-11 ENCOUNTER — Ambulatory Visit: Payer: Medicare Other | Admitting: Neurology

## 2018-04-14 DIAGNOSIS — L739 Follicular disorder, unspecified: Secondary | ICD-10-CM | POA: Diagnosis not present

## 2018-04-14 DIAGNOSIS — L821 Other seborrheic keratosis: Secondary | ICD-10-CM | POA: Diagnosis not present

## 2018-04-14 DIAGNOSIS — L820 Inflamed seborrheic keratosis: Secondary | ICD-10-CM | POA: Diagnosis not present

## 2018-04-14 DIAGNOSIS — D485 Neoplasm of uncertain behavior of skin: Secondary | ICD-10-CM | POA: Diagnosis not present

## 2018-05-03 ENCOUNTER — Other Ambulatory Visit (HOSPITAL_COMMUNITY): Payer: Self-pay | Admitting: Orthopedic Surgery

## 2018-05-07 NOTE — Progress Notes (Signed)
Entered in error

## 2018-05-08 ENCOUNTER — Telehealth: Payer: Self-pay

## 2018-05-08 ENCOUNTER — Ambulatory Visit (INDEPENDENT_AMBULATORY_CARE_PROVIDER_SITE_OTHER): Payer: Medicare Other | Admitting: Family Medicine

## 2018-05-08 NOTE — Telephone Encounter (Signed)
Copied from [place] [phone]. Topic: General - Inquiry >> May 08, 2018 11:58 AM Scherrie Gerlach wrote: Reason for CRM: pt wants to know if Dr Raeford Razor has any very stiff orthotic fitters.  Pt saw Dr Tamala Julian today and the ones Dr Tamala Julian has do not come all the way to the end and are not very stiff.  Dr Tamala Julian advised pt you had different ones out there.

## 2018-05-09 NOTE — Telephone Encounter (Signed)
Left VM for patient. If he calls back please have him speak with a nurse/CMA and inform that the orthotic I make is a semi rigid full length orthotic. The [facility] can report results to patient.   If any questions then please take the best time and phone number to call and I will try to call him back.   Rosemarie Ax, MD [facility] 05/09/2018, 8:16 AM

## 2018-05-16 ENCOUNTER — Ambulatory Visit: Payer: Medicare Other | Admitting: Family Medicine

## 2018-05-16 DIAGNOSIS — M19079 Primary osteoarthritis, unspecified ankle and foot: Secondary | ICD-10-CM | POA: Diagnosis not present

## 2018-05-16 NOTE — Progress Notes (Signed)
Adrian [name] - 67 y.o. male MRN [id number]  Date of birth: Feb 02, 1951  SUBJECTIVE:  Including CC & ROS.  No chief complaint on file.   Adrian [name] is a 67 y.o. 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 Here to have custom orthotics made.    Review of Systems  HISTORY: Past Medical, Surgical, Social, and Family History Reviewed & Updated per EMR.   Pertinent Historical Findings include:  Past Medical History:  Diagnosis Date  . Allergic rhinitis   . Cancer ([place]) 2016   basal cell skin cancer nose  . Cataract   . GERD (gastroesophageal reflux disease)   . Hyperlipidemia   . Hypertension   . Hypothyroidism 2010   mild  . Hypothyroidism   . Trigeminal neuralgia    L.  . Vitamin B12 deficiency 2010    Past Surgical History:  Procedure Laterality Date  . arthroscopic surgery for chondromalacia Left 1995  . CATARACT EXTRACTION W/ INTRAOCULAR LENS  IMPLANT, BILATERAL  2017  . COLONOSCOPY    . gamma knife surgery for trigeminal neuralgia  2005  . inguinal herniorrhahpy  1991   L.  Marland Kitchen POLYPECTOMY    . TONSILLECTOMY  1959  . TRIGEMINAL NERVE DECOMPRESSION  2008   Left  Dr. Claybon Jabs    Allergies  Allergen Reactions  . Elavil [Amitriptyline Hcl]     constipation  . Tape Rash    Irritation  . Triple Antibiotic [Bacitracin-Neomycin-Polymyxin] Rash    Family History  Problem Relation Age of Onset  . Heart disease Mother 2       MI  . Diabetes Mother   . Prostate cancer Father 80       prostate  . Breast cancer Sister 33       breast ca  . Stroke Brother   . Colon cancer Neg Hx   . Esophageal cancer Neg Hx   . Rectal cancer Neg Hx   . Stomach cancer Neg Hx      Social History   Socioeconomic History  . Marital status: Married    Spouse name: Not on file  . Number of children: Not on file  . Years of education: Not on file  . Highest education level: Not on file  Occupational History  . Occupation: retired   Comment: TEFL teacher  Social Needs  . Financial resource strain: Not on file  . Food insecurity:    Worry: Not on file    Inability: Not on file  . Transportation needs:    Medical: Not on file    Non-medical: Not on file  Tobacco Use  . Smoking status: Former Smoker    Last attempt to quit: 05/31/1992    Years since quitting: 25.9  . Smokeless tobacco: Never Used  Substance and Sexual Activity  . Alcohol use: Yes    Alcohol/week: 7.0 standard drinks    Types: 7 Glasses of wine per week    Comment: at least one drink per night; sometimes "we split a bottle of wine"  . Drug use: No  . Sexual activity: Yes  Lifestyle  . Physical activity:    Days per week: Not on file    Minutes per session: Not on file  . Stress: Not on file  Relationships  . Social connections:    Talks on phone: Not on file    Gets together: Not on file    Attends religious service:  Not on file    Active member of club or organization: Not on file    Attends meetings of clubs or organizations: Not on file    Relationship status: Not on file  . Intimate partner violence:    Fear of current or ex partner: Not on file    Emotionally abused: Not on file    Physically abused: Not on file    Forced sexual activity: Not on file  Other Topics Concern  . Not on file  Social History Narrative   UCD   Married x2: 12 years 59st; 15 years ('60) 2nd   One daughter, step daugfhter, step son   Likes to bike     PHYSICAL EXAM:  VS: There were no vitals taken for this visit. Physical Exam Gen: NAD, alert, cooperative with exam, well-appearing      ASSESSMENT & PLAN:   Arthritis of first MTP joint May need a first ray post placed for more cushioning.   Patient was fitted for a standard, cushioned, semi-rigid orthotic. The orthotic was heated and afterward the patient stood on the orthotic blank positioned on the orthotic stand. The patient was positioned in subtalar neutral position and  10 degrees of ankle dorsiflexion in a weight bearing stance. After completion of molding, a stable base was applied to the orthotic blank. The blank was ground to a stable position for weight bearing. Size: 11 Base: Blue EVA Additional Posting and Padding: None The patient ambulated these, and they were very comfortable.

## 2018-05-16 NOTE — Assessment & Plan Note (Signed)
May need a first ray post placed for more cushioning.   Patient was fitted for a standard, cushioned, semi-rigid orthotic. The orthotic was heated and afterward the patient stood on the orthotic blank positioned on the orthotic stand. The patient was positioned in subtalar neutral position and 10 degrees of ankle dorsiflexion in a weight bearing stance. After completion of molding, a stable base was applied to the orthotic blank. The blank was ground to a stable position for weight bearing. Size: 11 Base: Blue EVA Additional Posting and Padding: None The patient ambulated these, and they were very comfortable.

## 2018-05-26 ENCOUNTER — Other Ambulatory Visit: Payer: Self-pay | Admitting: Internal Medicine

## 2018-06-06 ENCOUNTER — Encounter: Payer: Self-pay | Admitting: Family Medicine

## 2018-06-06 ENCOUNTER — Ambulatory Visit: Payer: Medicare Other | Admitting: Family Medicine

## 2018-06-06 ENCOUNTER — Ambulatory Visit: Payer: Self-pay

## 2018-06-06 VITALS — BP 140/90 | HR 54 | Ht 70.0 in | Wt 230.0 lb

## 2018-06-06 DIAGNOSIS — M19049 Primary osteoarthritis, unspecified hand: Secondary | ICD-10-CM | POA: Diagnosis not present

## 2018-06-06 DIAGNOSIS — G8929 Other chronic pain: Secondary | ICD-10-CM

## 2018-06-06 DIAGNOSIS — M79644 Pain in right finger(s): Secondary | ICD-10-CM | POA: Diagnosis not present

## 2018-06-06 NOTE — Patient Instructions (Signed)
Good to see you  Ice is your friend    

## 2018-06-06 NOTE — Progress Notes (Signed)
[name] [place], [place] [address] Phone: [phone] Subjective:    I [name] am serving as a Education administrator for Dr. Hulan Saas.    CC: Right thumb pain  JTT:[id number]  Adrian [name] is a 68 y.o. male coming in with complaint of right thumb pain. Would like an injection. Toe will getting fixed at the end of February.  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  Has been trying home exercise but no significant improvement..  Patient is doing more icing regimen and home exercises.  Topical anti-inflammatories have been given previously.  Minimal improvement       Past Medical History:  Diagnosis Date  . Allergic rhinitis   . Cancer ([place]) 2016   basal cell skin cancer nose  . Cataract   . GERD (gastroesophageal reflux disease)   . Hyperlipidemia   . Hypertension   . Hypothyroidism 2010   mild  . Hypothyroidism   . Trigeminal neuralgia    L.  . Vitamin B12 deficiency 2010   Past Surgical History:  Procedure Laterality Date  . arthroscopic surgery for chondromalacia Left 1995  . CATARACT EXTRACTION W/ INTRAOCULAR LENS  IMPLANT, BILATERAL  2017  . COLONOSCOPY    . gamma knife surgery for trigeminal neuralgia  2005  . inguinal herniorrhahpy  1991   L.  Marland Kitchen POLYPECTOMY    . TONSILLECTOMY  1959  . TRIGEMINAL NERVE DECOMPRESSION  2008   Left  Dr. Claybon Jabs   Social History   Socioeconomic History  . Marital status: Married    Spouse name: Not on file  . Number of children: Not on file  . Years of education: Not on file  . Highest education level: Not on file  Occupational History  . Occupation: retired    Comment: TEFL teacher  Social Needs  . Financial resource strain: Not on file  . Food insecurity:    Worry: Not on file    Inability: Not on file  . Transportation needs:    Medical: Not on file    Non-medical: Not on file  Tobacco Use  . Smoking status: Former Smoker    Last  attempt to quit: 05/31/1992    Years since quitting: 26.0  . Smokeless tobacco: Never Used  Substance and Sexual Activity  . Alcohol use: Yes    Alcohol/week: 7.0 standard drinks    Types: 7 Glasses of wine per week    Comment: at least one drink per night; sometimes "we split a bottle of wine"  . Drug use: No  . Sexual activity: Yes  Lifestyle  . Physical activity:    Days per week: Not on file    Minutes per session: Not on file  . Stress: Not on file  Relationships  . Social connections:    Talks on phone: Not on file    Gets together: Not on file    Attends religious service: Not on file    Active member of club or organization: Not on file    Attends meetings of clubs or organizations: Not on file    Relationship status: Not on file  Other Topics Concern  . Not on file  Social History Narrative   UCD   Married x2: 12 years 64st; 15 years ('21) 2nd   One daughter, step daugfhter, step son   Likes to bike   Allergies  Allergen Reactions  . Elavil [Amitriptyline Hcl]  constipation  . Tape Rash    Irritation  . Triple Antibiotic [Bacitracin-Neomycin-Polymyxin] Rash   Family History  Problem Relation Age of Onset  . Heart disease Mother 70       MI  . Diabetes Mother   . Prostate cancer Father 63       prostate  . Breast cancer Sister 28       breast ca  . Stroke Brother   . Colon cancer Neg Hx   . Esophageal cancer Neg Hx   . Rectal cancer Neg Hx   . Stomach cancer Neg Hx     Current Outpatient Medications (Endocrine & Metabolic):  .  levothyroxine (SYNTHROID, LEVOTHROID) 75 MCG tablet, Take 1 tablet (75 mcg total) by mouth daily.  Current Outpatient Medications (Cardiovascular):  .  losartan (COZAAR) 100 MG tablet, TAKE 1/2 (ONE-HALF) TABLET BY MOUTH ONCE DAILY  Current Outpatient Medications (Respiratory):  .  fexofenadine (ALLEGRA) 180 MG tablet, Take 180 mg by mouth daily. .  fluticasone (FLONASE) 50 MCG/ACT nasal spray, Place into both nostrils  daily. .  Pseudoeph-Doxylamine-DM-APAP (NYQUIL PO), Take by mouth as needed.  Current Outpatient Medications (Analgesics):  .  aspirin 81 MG tablet, Take 1 tablet (81 mg total) by mouth at bedtime.   Current Outpatient Medications (Other):  .  citalopram (CELEXA) 10 MG tablet, Take 10 mg by mouth daily. Marland Kitchen  gabapentin (NEURONTIN) 100 MG capsule, TAKE 2 CAPSULES BY MOUTH AT BEDTIME .  gentamicin ointment (GARAMYCIN) 0.1 %, APPLY  THREE TIMES DAILY .  halobetasol (ULTRAVATE) 0.05 % cream, APPLY  CREAM TOPICALLY TWICE DAILY AS NEEDED .  Melatonin 10 MG TABS, Take 1 each by mouth at bedtime. Takes 3 mg at bedtime .  Misc Natural Products (TART CHERRY ADVANCED) CAPS, Take by mouth daily. .  Multiple Vitamins-Minerals (CENTRUM SILVER ADULT 50+ PO), Take 1 tablet by mouth at bedtime. .  Probiotic Product (PROBIOTIC DAILY PO), Take 1 tablet by mouth at bedtime. .  traZODone (DESYREL) 50 MG tablet, TAKE 1/2 TO 1 (ONE-HALF TO ONE) TABLET BY MOUTH AT BEDTIME AS NEEDED FOR SLEEP .  TURMERIC PO, Take by mouth daily. Marland Kitchen  VALERIAN ROOT PO, Take 1,500 mg by mouth at bedtime. .  Vitamin D, Ergocalciferol, (DRISDOL) 50000 units CAPS capsule, TAKE 1 CAPSULE BY MOUTH ONCE A WEEK .  Zinc 50 MG TABS, Take 1 tablet by mouth every morning.   .  zolpidem (AMBIEN CR) 12.5 MG CR tablet, Take 1 tablet (12.5 mg total) by mouth at bedtime as needed for sleep.    Past medical history, social, surgical and family history all reviewed in electronic medical record.  No pertanent information unless stated regarding to the chief complaint.   Review of Systems:  No headache, visual changes, nausea, vomiting, diarrhea, constipation, dizziness, abdominal pain, skin rash, fevers, chills, night sweats, weight loss, swollen lymph nodes, body aches, joint swelling, chest pain, shortness of breath, mood changes.  Positive muscle aches  Objective  Blood pressure 140/90, pulse (!) 54, height 5\' 10"  (1.778 m), weight 230 lb (104.3  kg), SpO2 97 %.   General: No apparent distress alert and oriented x3 mood and affect normal, dressed appropriately.  HEENT: Pupils equal, extraocular movements intact  Respiratory: Patient's speak in full sentences and does not appear short of breath  Cardiovascular: No lower extremity edema, non tender, no erythema  Skin: Warm dry intact with no signs of infection or rash on extremities or on axial skeleton.  Abdomen: Soft nontender  Neuro: Cranial nerves II through XII are intact, neurovascularly intact in all extremities with 2+ DTRs and 2+ pulses.  Lymph: No lymphadenopathy of posterior or anterior cervical chain or axillae bilaterally.  Gait mild antalgic MSK:  tender with full range of motion and good stability and symmetric strength and tone of shoulders, elbows, wrist, hip, knee and ankles bilaterally.  Hand exam shows the patient [facility] does have a positive grind on the right side.  No loss of the thenar eminence wasting at this moment though.  Patient's grip strength was 4-5 compared to the contralateral side.  Negative Spurling's of the neck.  Near full range of motion of the wrist otherwise.  Procedure: Real-time Ultrasound Guided Injection of right CMC arthritis Device: GE Logiq Q7 Ultrasound guided injection is preferred based studies that show increased duration, increased effect, greater accuracy, decreased procedural pain, increased response rate, and decreased cost with ultrasound guided versus blind injection.  Verbal informed consent obtained.  Time-out conducted.  Noted no overlying erythema, induration, or other signs of local infection.  Skin prepped in a sterile fashion.  Local anesthesia: Topical Ethyl chloride.  With sterile technique and under real time ultrasound guidance: 25-gauge half inch needle used and injected with 0.5 cc of 0.5% Marcaine and 0.5 cc of Kenalog 40 mg/mL into the right CMC joint Completed without difficulty  Pain immediately resolved suggesting  accurate placement of the medication.  Advised to call if fevers/chills, erythema, induration, drainage, or persistent bleeding.  Images permanently stored and available for review in the ultrasound unit.  Impression: Technically successful ultrasound guided injection.    Impression and Recommendations:     This case required medical decision making of moderate complexity. The above documentation has been reviewed and is accurate and complete Lyndal Pulley, DO       Note: This dictation was prepared with Dragon dictation along with smaller phrase technology. Any transcriptional errors that result from this process are unintentional.

## 2018-06-06 NOTE — Assessment & Plan Note (Signed)
Patient given injection today.  Discussed icing regimen and home exercise.  Discussed which activities to do which was to avoid.  Increase activity slowly over the course of next several days.  Follow-up again in 4 to 8 weeks.

## 2018-06-09 ENCOUNTER — Other Ambulatory Visit: Payer: Self-pay | Admitting: Internal Medicine

## 2018-06-16 ENCOUNTER — Telehealth: Payer: Self-pay | Admitting: *Deleted

## 2018-06-16 NOTE — Telephone Encounter (Signed)
Called patient to schedule AWV, patient declined.  

## 2018-06-19 ENCOUNTER — Ambulatory Visit (INDEPENDENT_AMBULATORY_CARE_PROVIDER_SITE_OTHER): Payer: Medicare Other | Admitting: Internal Medicine

## 2018-06-19 ENCOUNTER — Encounter: Payer: Self-pay | Admitting: Internal Medicine

## 2018-06-19 VITALS — BP 122/82 | HR 56 | Temp 98.1°F | Ht 70.0 in | Wt 228.0 lb

## 2018-06-19 DIAGNOSIS — I10 Essential (primary) hypertension: Secondary | ICD-10-CM

## 2018-06-19 DIAGNOSIS — R251 Tremor, unspecified: Secondary | ICD-10-CM

## 2018-06-19 DIAGNOSIS — Z23 Encounter for immunization: Secondary | ICD-10-CM

## 2018-06-19 DIAGNOSIS — Z0000 Encounter for general adult medical examination without abnormal findings: Secondary | ICD-10-CM | POA: Diagnosis not present

## 2018-06-19 DIAGNOSIS — E538 Deficiency of other specified B group vitamins: Secondary | ICD-10-CM | POA: Diagnosis not present

## 2018-06-19 MED ORDER — GABAPENTIN 100 MG PO CAPS: 200.0000 mg | ORAL_CAPSULE | Freq: Every day | ORAL | 3 refills | Status: DC

## 2018-06-19 MED ORDER — TRAZODONE HCL 50 MG PO TABS: ORAL_TABLET | ORAL | 3 refills | Status: DC

## 2018-06-19 MED ORDER — LEVOTHYROXINE SODIUM 75 MCG PO TABS: 75.0000 ug | ORAL_TABLET | Freq: Every day | ORAL | 3 refills | Status: DC

## 2018-06-19 NOTE — Progress Notes (Signed)
Subjective:  Patient ID: Adrian [name], male    DOB: 07-04-1950  Age: 68 y.o. MRN: [id number]  CC: No chief complaint on file.   HPI Adrian [name] presents for a well  Outpatient Medications Prior to Visit  Medication Sig Dispense Refill  . fexofenadine (ALLEGRA) 180 MG tablet Take 180 mg by mouth daily.    . fluticasone (FLONASE) 50 MCG/ACT nasal spray Place into both nostrils daily.    Marland Kitchen gabapentin (NEURONTIN) 100 MG capsule TAKE 2 CAPSULES BY MOUTH AT BEDTIME 180 capsule 1  . gentamicin ointment (GARAMYCIN) 0.1 % APPLY  THREE TIMES DAILY 30 g 2  . halobetasol (ULTRAVATE) 0.05 % cream APPLY  CREAM TOPICALLY TWICE DAILY AS NEEDED 50 g 0  . levothyroxine (SYNTHROID, LEVOTHROID) 75 MCG tablet TAKE 1 TABLET BY MOUTH ONCE DAILY 90 tablet 0  . losartan (COZAAR) 100 MG tablet TAKE 1/2 (ONE-HALF) TABLET BY MOUTH ONCE DAILY 45 tablet 0  . Misc Natural Products (TART CHERRY ADVANCED) CAPS Take by mouth daily.    . Multiple Vitamins-Minerals (CENTRUM SILVER ADULT 50+ PO) Take 1 tablet by mouth at bedtime.    . Probiotic Product (PROBIOTIC DAILY PO) Take 1 tablet by mouth at bedtime.    . traZODone (DESYREL) 50 MG tablet TAKE 1/2 TO 1 (ONE-HALF TO ONE) TABLET BY MOUTH AT BEDTIME AS NEEDED FOR SLEEP 90 tablet 0  . TURMERIC PO Take by mouth daily.    Marland Kitchen VALERIAN ROOT PO Take 1,500 mg by mouth at bedtime.    . Vitamin D, Ergocalciferol, (DRISDOL) 50000 units CAPS capsule TAKE 1 CAPSULE BY MOUTH ONCE A WEEK 12 capsule 0  . Zinc 50 MG TABS Take 1 tablet by mouth every morning.      Marland Kitchen aspirin 81 MG tablet Take 1 tablet (81 mg total) by mouth at bedtime. (Patient not taking: Reported on 06/19/2018) 30 tablet 11  . citalopram (CELEXA) 10 MG tablet Take 10 mg by mouth daily.    . Melatonin 10 MG TABS Take 1 each by mouth at bedtime. Takes 3 mg at bedtime    . Pseudoeph-Doxylamine-DM-APAP (NYQUIL PO) Take by mouth as needed.    . zolpidem (AMBIEN CR) 12.5 MG CR tablet Take 1 tablet (12.5 mg total) by  mouth at bedtime as needed for sleep. (Patient not taking: Reported on 06/19/2018) 90 tablet 1   No facility-administered medications prior to visit.     ROS: Review of Systems  Constitutional: Negative for appetite change, fatigue and unexpected weight change.  HENT: Negative for congestion, nosebleeds, sneezing, sore throat and trouble swallowing.   Eyes: Negative for itching and visual disturbance.  Respiratory: Negative for cough.   Cardiovascular: Negative for chest pain, palpitations and leg swelling.  Gastrointestinal: Negative for abdominal distention, blood in stool, diarrhea and nausea.  Genitourinary: Negative for frequency and hematuria.  Musculoskeletal: Negative for back pain, gait problem, joint swelling and neck pain.  Skin: Negative for rash.  Neurological: Negative for dizziness, tremors, speech difficulty and weakness.  Psychiatric/Behavioral: Negative for agitation, dysphoric mood, sleep disturbance and suicidal ideas. The patient is not nervous/anxious.     Objective:  BP 122/82 (BP Location: Left Arm, Patient Position: Sitting, Cuff Size: Large)   Pulse (!) 56   Temp 98.1 F (36.7 C) (Oral)   Ht 5\' 10"  (1.778 m)   Wt 228 lb (103.4 kg)   SpO2 96%   BMI 32.71 kg/m   BP Readings from Last 3 Encounters:  06/19/18 122/82  06/06/18 140/90  12/13/17 140/82    Wt Readings from Last 3 Encounters:  06/19/18 228 lb (103.4 kg)  06/06/18 230 lb (104.3 kg)  12/13/17 211 lb (95.7 kg)    Physical Exam Constitutional:      General: He is not in acute distress.    Appearance: He is well-developed.     Comments: NAD  Eyes:     Conjunctiva/sclera: Conjunctivae normal.     Pupils: Pupils are equal, round, and reactive to light.  Neck:     Musculoskeletal: Normal range of motion.     Thyroid: No thyromegaly.     Vascular: No JVD.  Cardiovascular:     Rate and Rhythm: Normal rate and regular rhythm.     Heart sounds: Normal heart sounds. No murmur. No friction  rub. No gallop.   Pulmonary:     Effort: Pulmonary effort is normal. No respiratory distress.     Breath sounds: Normal breath sounds. No wheezing or rales.  Chest:     Chest wall: No tenderness.  Abdominal:     General: Bowel sounds are normal. There is no distension.     Palpations: Abdomen is soft. There is no mass.     Tenderness: There is no abdominal tenderness. There is no guarding or rebound.  Musculoskeletal: Normal range of motion.        General: No tenderness.  Lymphadenopathy:     Cervical: No cervical adenopathy.  Skin:    General: Skin is warm and dry.     Findings: No rash.  Neurological:     Mental Status: He is alert and oriented to person, place, and time.     Cranial Nerves: No cranial nerve deficit.     Motor: No abnormal muscle tone.     Coordination: Coordination normal.     Gait: Gait normal.     Deep Tendon Reflexes: Reflexes are normal and symmetric.  Psychiatric:        Behavior: Behavior normal.        Thought Content: Thought content normal.        Judgment: Judgment normal.   pt declined a rectal exam Mild tremor   Lab Results  Component Value Date   WBC 5.5 05/26/2017   HGB 15.0 05/26/2017   HCT 44.2 05/26/2017   PLT 154.0 05/26/2017   GLUCOSE 105 (H) 05/26/2017   CHOL 152 05/26/2017   TRIG 196.0 (H) 05/26/2017   HDL 34.90 (L) 05/26/2017   LDLDIRECT 66.0 05/10/2016   LDLCALC 78 05/26/2017   ALT 27 05/26/2017   AST 18 05/26/2017   NA 138 05/26/2017   K 4.3 05/26/2017   CL 102 05/26/2017   CREATININE 1.37 05/26/2017   BUN 21 05/26/2017   CO2 29 05/26/2017   TSH 2.57 05/26/2017   PSA 0.72 05/26/2017   INR 1.0 RATIO 09/18/2007   HGBA1C 5.6 12/04/2008    [place] Limited Joint Space Structures Low Right  Result Date: 04/15/2017 Limited musculoskeletal ultrasound was performed and interpreted by Lyndal Pulley Limited ultrasound of patient's first metatarsal shows the patient does have what seems to be a capsulitis.  Patient does have  significant vascularity in the area.  Seems to be somewhat abnormal.  Pulsatile. Impression: Capsulitis with abnormal vascularity.  Procedure: Real-time Ultrasound Guided Injection of right first MTP Device: GE Logiq Q7 Ultrasound guided injection is preferred based studies that show increased duration, increased effect, greater accuracy, decreased procedural pain, increased response rate, and decreased cost with ultrasound guided  versus blind injection. Verbal informed consent obtained. Time-out conducted. Noted no overlying erythema, induration, or other signs of local infection. Skin prepped in a sterile fashion. Local anesthesia: Topical Ethyl chloride. With sterile technique and under real time ultrasound guidance: With a 25-gauge half inch needle injected with a total of 0.5 cc of 0.5% Marcaine and 0.5 cc of Kenalog 40 mg/mL. Completed without difficulty Pain immediately resolved suggesting accurate placement of the medication. Advised to call if fevers/chills, erythema, induration, drainage, or persistent bleeding. Images permanently stored and available for review in the ultrasound unit. Impression: Technically successful ultrasound guided injection.  Dg Foot Complete Right  Result Date: 04/11/2017 CLINICAL DATA:  Right toe pain 5 days no injury EXAM: RIGHT FOOT COMPLETE - 3+ VIEW COMPARISON:  None. FINDINGS: Advanced degenerative change in the first metatarsal phalangeal joint with joint space narrowing and spurring. No erosion. Negative for fracture. No other arthropathy. 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 Electronically Signed   By: Franchot Gallo M.D.   On: 04/11/2017 13:40    Assessment & Plan:   There are no diagnoses linked to this encounter.   No orders of the defined types were placed in this encounter.    Follow-up: No follow-ups on file.  Walker Kehr, MD

## 2018-06-19 NOTE — Assessment & Plan Note (Addendum)
Here for medicare wellness/physical  Diet: heart healthy  Physical activity: not sedentary  Depression/mood screen: negative  Hearing: intact to whispered voice  Visual acuity: grossly normal - reading glasses, performs annual eye exam - just had a cataract surgery ADLs: capable  Fall risk: low to none  Home safety: good  Cognitive evaluation: intact to orientation, naming, recall and repetition  EOL planning: adv directives, full code/ I agree  I have personally reviewed and have noted  1. The patient's medical, surgical and social history  2. Their use of alcohol, tobacco or illicit drugs  3. Their current medications and supplements  4. The patient's functional ability including ADL's, fall risks, home safety risks and hearing or visual impairment.  5. Diet and physical activities  6. Evidence for depression or mood disorders 7. The roster of all physicians providing medical care to patient - is listed in the Snapshot section of the chart and reviewed today.    Today patient counseled on age appropriate routine health concerns for screening and prevention, each reviewed and up to date or declined. Immunizations reviewed and up to date or declined. Labs ordered and reviewed. Risk factors for depression reviewed and negative. Hearing function and visual acuity are intact. ADLs screened and addressed as needed. Functional ability and level of safety reviewed and appropriate. Education, counseling and referrals performed based on assessed risks today. Patient provided with a copy of personalized plan for preventive services.   Colon 2017  cardiac CT scan for calcium scoring offered

## 2018-06-19 NOTE — Addendum Note (Signed)
Addended by: Karren Cobble on: 06/19/2018 09:22 AM   Modules accepted: Orders

## 2018-06-19 NOTE — Assessment & Plan Note (Signed)
losartan 

## 2018-06-19 NOTE — Assessment & Plan Note (Signed)
   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

## 2018-06-19 NOTE — Assessment & Plan Note (Signed)
On B12 

## 2018-06-19 NOTE — Patient Instructions (Signed)

## 2018-06-21 ENCOUNTER — Other Ambulatory Visit (INDEPENDENT_AMBULATORY_CARE_PROVIDER_SITE_OTHER): Payer: Medicare Other

## 2018-06-21 DIAGNOSIS — E538 Deficiency of other specified B group vitamins: Secondary | ICD-10-CM | POA: Diagnosis not present

## 2018-06-21 DIAGNOSIS — Z0000 Encounter for general adult medical examination without abnormal findings: Secondary | ICD-10-CM | POA: Diagnosis not present

## 2018-06-21 DIAGNOSIS — I10 Essential (primary) hypertension: Secondary | ICD-10-CM

## 2018-06-21 LAB — LIPID PANEL
CHOL/HDL RATIO: 4
Cholesterol: 139 mg/dL (ref 0–200)
HDL: 39.2 mg/dL (ref >39.00)
LDL Cholesterol: 78 mg/dL (ref 0–99)
NonHDL: 99.44
Triglycerides: 109 mg/dL (ref 0.0–149.0)
VLDL: 21.8 mg/dL (ref 0.0–40.0)

## 2018-06-21 LAB — PSA: PSA: 0.69 ng/mL (ref 0.10–4.00)

## 2018-06-21 LAB — URINALYSIS
Bilirubin Urine: NEGATIVE
Hgb urine dipstick: NEGATIVE
Ketones, ur: NEGATIVE
LEUKOCYTES UA: NEGATIVE
Nitrite: NEGATIVE
Specific Gravity, Urine: 1.01 (ref 1.000–1.030)
Total Protein, Urine: NEGATIVE
UROBILINOGEN UA: 0.2 (ref 0.0–1.0)
Urine Glucose: NEGATIVE
pH: 7 (ref 5.0–8.0)

## 2018-06-21 LAB — BASIC METABOLIC PANEL
BUN: 21 mg/dL (ref 6–23)
CO2: 30 mEq/L (ref 19–32)
Calcium: 9.7 mg/dL (ref 8.4–10.5)
Chloride: 102 mEq/L (ref 96–112)
Creatinine, Ser: 1.49 mg/dL (ref 0.40–1.50)
GFR: 46.98 mL/min — ABNORMAL LOW (ref >60.00)
Glucose, Bld: 114 mg/dL — ABNORMAL HIGH (ref 70–99)
Potassium: 4.6 mEq/L (ref 3.5–5.1)
Sodium: 139 mEq/L (ref 135–145)

## 2018-06-21 LAB — CBC WITH DIFFERENTIAL/PLATELET
BASOS ABS: 0 10*3/uL (ref 0.0–0.1)
Basophils Relative: 0.5 % (ref 0.0–3.0)
EOS ABS: 0.2 10*3/uL (ref 0.0–0.7)
Eosinophils Relative: 5 % (ref 0.0–5.0)
HCT: 44 % (ref 39.0–52.0)
Hemoglobin: 15.1 g/dL (ref 13.0–17.0)
Lymphocytes Relative: 42.6 % (ref 12.0–46.0)
Lymphs Abs: 2 10*3/uL (ref 0.7–4.0)
MCHC: 34.3 g/dL (ref 30.0–36.0)
MCV: 89 fl (ref 78.0–100.0)
MONO ABS: 0.5 10*3/uL (ref 0.1–1.0)
Monocytes Relative: 10.5 % (ref 3.0–12.0)
Neutro Abs: 2 10*3/uL (ref 1.4–7.7)
Neutrophils Relative %: 41.4 % — ABNORMAL LOW (ref 43.0–77.0)
Platelets: 147 10*3/uL — ABNORMAL LOW (ref 150.0–400.0)
RBC: 4.94 Mil/uL (ref 4.22–5.81)
RDW: 13.1 % (ref 11.5–15.5)
WBC: 4.8 10*3/uL (ref 4.0–10.5)

## 2018-06-21 LAB — HEPATIC FUNCTION PANEL
ALK PHOS: 47 U/L (ref 39–117)
ALT: 18 U/L (ref 0–53)
AST: 16 U/L (ref 0–37)
Albumin: 4.4 g/dL (ref 3.5–5.2)
Bilirubin, Direct: 0.1 mg/dL (ref 0.0–0.3)
Total Bilirubin: 0.5 mg/dL (ref 0.2–1.2)
Total Protein: 6.8 g/dL (ref 6.0–8.3)

## 2018-06-21 LAB — TSH: TSH: 3.33 u[IU]/mL (ref 0.35–4.50)

## 2018-06-22 LAB — VITAMIN B12: VITAMIN B 12: 349 pg/mL (ref 211–911)

## 2018-07-03 NOTE — Progress Notes (Signed)
Adrian [name] was seen today in the movement disorders clinic for follow-up of resting tremor.  Patient is accompanied by his wife who supplements the history.  Records are reviewed since our last visit, including primary care records.  He had lab work after our last visit, which was unremarkable, although we did have a repeat B12 at his primary care doctor on January 22 and this was slightly low at 349.  Pt states that tremor is much better, although it is still there.  States that he stopped celexa and tremor is better.  He has some tremor still but not like previously.   He is taking CBD oil.    PREVIOUS MEDICATIONS: none to date  ALLERGIES:   Allergies  Allergen Reactions  . Elavil [Amitriptyline Hcl]     constipation  . Tape Rash    Irritation  . Triple Antibiotic [Bacitracin-Neomycin-Polymyxin] Rash    CURRENT MEDICATIONS:  Outpatient Encounter Medications as of 07/04/2018  Medication Sig  . fexofenadine (ALLEGRA) 180 MG tablet Take 180 mg by mouth daily.  . fluticasone (FLONASE) 50 MCG/ACT nasal spray Place into both nostrils daily.  Marland Kitchen gabapentin (NEURONTIN) 100 MG capsule Take 2 capsules (200 mg total) by mouth at bedtime.  Marland Kitchen gentamicin ointment (GARAMYCIN) 0.1 % APPLY  THREE TIMES DAILY  . halobetasol (ULTRAVATE) 0.05 % cream APPLY  CREAM TOPICALLY TWICE DAILY AS NEEDED  . levothyroxine (SYNTHROID, LEVOTHROID) 75 MCG tablet Take 1 tablet (75 mcg total) by mouth daily.  Marland Kitchen losartan (COZAAR) 100 MG tablet TAKE 1/2 (ONE-HALF) TABLET BY MOUTH ONCE DAILY  . Misc Natural Products (TART CHERRY ADVANCED) CAPS Take by mouth daily.  . Multiple Vitamins-Minerals (CENTRUM SILVER ADULT 50+ PO) Take 1 tablet by mouth at bedtime.  . Probiotic Product (PROBIOTIC DAILY PO) Take 1 tablet by mouth at bedtime.  . traZODone (DESYREL) 50 MG tablet TAKE 1/2 TO 1 (ONE-HALF TO ONE) TABLET BY MOUTH AT BEDTIME AS NEEDED FOR SLEEP  . TURMERIC PO Take by mouth daily.  Marland Kitchen UNABLE TO FIND CBD oil  .  VALERIAN ROOT PO Take 1,500 mg by mouth at bedtime.  . Vitamin D, Ergocalciferol, (DRISDOL) 50000 units CAPS capsule TAKE 1 CAPSULE BY MOUTH ONCE A WEEK  . Zinc 50 MG TABS Take 1 tablet by mouth every morning.     No facility-administered encounter medications on file as of 07/04/2018.     PAST MEDICAL HISTORY:   Past Medical History:  Diagnosis Date  . Allergic rhinitis   . Cancer ([place]) 2016   basal cell skin cancer nose  . Cataract   . GERD (gastroesophageal reflux disease)   . Hyperlipidemia   . Hypertension   . Hypothyroidism 2010   mild  . Hypothyroidism   . Trigeminal neuralgia    L.  . Vitamin B12 deficiency 2010    PAST SURGICAL HISTORY:   Past Surgical History:  Procedure Laterality Date  . arthroscopic surgery for chondromalacia Left 1995  . CATARACT EXTRACTION W/ INTRAOCULAR LENS  IMPLANT, BILATERAL  2017  . COLONOSCOPY    . gamma knife surgery for trigeminal neuralgia  2005  . inguinal herniorrhahpy  1991   L.  Marland Kitchen POLYPECTOMY    . TONSILLECTOMY  1959  . TRIGEMINAL NERVE DECOMPRESSION  2008   Left  Dr. Claybon Jabs    SOCIAL HISTORY:   Social History   Socioeconomic History  . Marital status: Married    Spouse name: Not on file  . Number of children: Not  on file  . Years of education: Not on file  . Highest education level: Not on file  Occupational History  . Occupation: retired    Comment: TEFL teacher  Social Needs  . Financial resource strain: Not on file  . Food insecurity:    Worry: Not on file    Inability: Not on file  . Transportation needs:    Medical: Not on file    Non-medical: Not on file  Tobacco Use  . Smoking status: Former Smoker    Last attempt to quit: 05/31/1992    Years since quitting: 26.1  . Smokeless tobacco: Never Used  Substance and Sexual Activity  . Alcohol use: Yes    Alcohol/week: 7.0 standard drinks    Types: 7 Glasses of wine per week    Comment: at least one drink per night; sometimes "we split a  bottle of wine"  . Drug use: No  . Sexual activity: Yes  Lifestyle  . Physical activity:    Days per week: Not on file    Minutes per session: Not on file  . Stress: Not on file  Relationships  . Social connections:    Talks on phone: Not on file    Gets together: Not on file    Attends religious service: Not on file    Active member of club or organization: Not on file    Attends meetings of clubs or organizations: Not on file    Relationship status: Not on file  . Intimate partner violence:    Fear of current or ex partner: Not on file    Emotionally abused: Not on file    Physically abused: Not on file    Forced sexual activity: Not on file  Other Topics Concern  . Not on file  Social History Narrative   UCD   Married x2: 12 years 46st; 15 years ('75) 2nd   One daughter, step daugfhter, step son   Likes to bike    FAMILY HISTORY:   Family Status  Relation Name Status  . Mother  Deceased  . Father  Deceased  . Sister  Alive  . Brother  Deceased  . Daughter  Alive  . Other 2 step children Alive  . Neg Hx  (Not Specified)    ROS:  Review of Systems  Constitutional: Negative.   HENT: Negative.   Eyes: Negative.   Respiratory: Negative.   Cardiovascular: Negative.   Gastrointestinal: Negative.   Skin: Negative.      PHYSICAL EXAMINATION:    VITALS:   Vitals:   07/04/18 1458  BP: 124/76  Pulse: 64  SpO2: 90%  Weight: 227 lb (103 kg)  Height: 5\' 10"  (1.778 m)   GEN:  The patient appears stated age and is in NAD. HEENT:  Normocephalic, atraumatic.  The mucous membranes are moist. The superficial temporal arteries are without ropiness or tenderness. CV:  RRR Lungs:  CTAB Neck/HEME:  There are no carotid bruits bilaterally.  Neurological examination:  Orientation: The patient is alert and oriented x3. Cranial nerves: There is good facial symmetry. The speech is fluent and clear. Soft palate rises symmetrically and there is no tongue deviation. Hearing  is intact to conversational tone. Sensation: Sensation is intact to light touch throughout Motor: Strength is 5/5 in the bilateral upper and lower extremities.   Shoulder shrug is equal and symmetric.  There is no pronator drift.  Movement examination: Tone: There is normal tone in the bilateral upper  extremities.  The tone in the lower extremities is normal.  Abnormal movements: There is no tremor noted today Coordination:  There is no decremation with RAM's, with any form of RAMS, including alternating supination and pronation of the forearm, hand opening and closing, finger taps, heel taps and toe taps. Gait and Station: The patient has no difficulty arising out of a deep-seated chair without the use of the hands. The patient's stride length is normal.  The patient has a negative pull test.      Labs:  Lab Results  Component Value Date   TSH 3.33 06/21/2018     Chemistry      Component Value Date/Time   NA 139 06/21/2018 0938   K 4.6 06/21/2018 0938   CL 102 06/21/2018 0938   CO2 30 06/21/2018 0938   BUN 21 06/21/2018 0938   CREATININE 1.49 06/21/2018 0938      Component Value Date/Time   CALCIUM 9.7 06/21/2018 0938   ALKPHOS 47 06/21/2018 0938   AST 16 06/21/2018 0938   ALT 18 06/21/2018 0938   BILITOT 0.5 06/21/2018 0938     Lab Results  Component Value Date   VITAMINB12 349 06/21/2018     ASSESSMENT/PLAN:  1.  History of LE resting tremor  -The first time that I saw him, tremor was present in both legs at rest, but the patient only noticed in the left lower extremity.  Today, I really did not see any tremor.  However, his sister does have a history of Parkinson's disease, so we have opted to continue to follow him.  He does state that when he got off of citalopram, his tremor got markedly better.    2.  History of left trigeminal neuralgia  -Resolved after decompression surgery (had gamma knife prior to that).  3.  Mild B12 deficiency  -Told to take low-dose  supplementation, 500 to 1000 mcg daily.  Would like to see his level over 400.  It is very close.  4.  Follow-up 1 year.  Cc:  Plotnikov, Evie Lacks, MD

## 2018-07-04 ENCOUNTER — Ambulatory Visit: Payer: Medicare Other | Admitting: Neurology

## 2018-07-04 ENCOUNTER — Encounter: Payer: Self-pay | Admitting: Neurology

## 2018-07-04 VITALS — BP 124/76 | HR 64 | Ht 70.0 in | Wt 227.0 lb

## 2018-07-04 DIAGNOSIS — R251 Tremor, unspecified: Secondary | ICD-10-CM

## 2018-07-04 NOTE — Patient Instructions (Addendum)
Start b12 - 500-1012mcg daily (over the counter)  The physicians and staff at [facility] are committed to providing excellent care. You may receive a survey requesting feedback about your experience at our office. We strive to receive "very good" responses to the survey questions. If you feel that your experience would prevent you from giving the office a "very good " response, please contact our office to try to remedy the situation. We may be reached at [phone]. Thank you for taking the time out of your busy day to complete the survey.

## 2018-07-12 ENCOUNTER — Other Ambulatory Visit: Payer: Self-pay | Admitting: Family Medicine

## 2018-07-19 ENCOUNTER — Other Ambulatory Visit: Payer: Self-pay

## 2018-07-19 ENCOUNTER — Encounter (HOSPITAL_BASED_OUTPATIENT_CLINIC_OR_DEPARTMENT_OTHER): Payer: Self-pay | Admitting: *Deleted

## 2018-07-26 ENCOUNTER — Encounter (HOSPITAL_BASED_OUTPATIENT_CLINIC_OR_DEPARTMENT_OTHER)
Admission: RE | Admit: 2018-07-26 | Discharge: 2018-07-26 | Disposition: A | Discharge status: Home / Self Care | Payer: Medicare Other | Source: Ambulatory Visit | Attending: Orthopedic Surgery | Admitting: Orthopedic Surgery

## 2018-07-26 DIAGNOSIS — J45909 Unspecified asthma, uncomplicated: Secondary | ICD-10-CM | POA: Diagnosis not present

## 2018-07-26 DIAGNOSIS — Z7951 Long term (current) use of inhaled steroids: Secondary | ICD-10-CM | POA: Diagnosis not present

## 2018-07-26 DIAGNOSIS — I10 Essential (primary) hypertension: Secondary | ICD-10-CM | POA: Diagnosis not present

## 2018-07-26 DIAGNOSIS — Z01818 Encounter for other preprocedural examination: Secondary | ICD-10-CM

## 2018-07-26 DIAGNOSIS — E669 Obesity, unspecified: Secondary | ICD-10-CM | POA: Diagnosis not present

## 2018-07-26 DIAGNOSIS — Z881 Allergy status to other antibiotic agents status: Secondary | ICD-10-CM | POA: Diagnosis not present

## 2018-07-26 DIAGNOSIS — E039 Hypothyroidism, unspecified: Secondary | ICD-10-CM | POA: Diagnosis not present

## 2018-07-26 DIAGNOSIS — K219 Gastro-esophageal reflux disease without esophagitis: Secondary | ICD-10-CM | POA: Diagnosis not present

## 2018-07-26 DIAGNOSIS — Z79899 Other long term (current) drug therapy: Secondary | ICD-10-CM | POA: Diagnosis not present

## 2018-07-26 DIAGNOSIS — G709 Myoneural disorder, unspecified: Secondary | ICD-10-CM | POA: Diagnosis not present

## 2018-07-26 DIAGNOSIS — Z79890 Hormone replacement therapy (postmenopausal): Secondary | ICD-10-CM | POA: Diagnosis not present

## 2018-07-26 DIAGNOSIS — Z6832 Body mass index (BMI) 32.0-32.9, adult: Secondary | ICD-10-CM | POA: Diagnosis not present

## 2018-07-26 DIAGNOSIS — Z888 Allergy status to other drugs, medicaments and biological substances status: Secondary | ICD-10-CM | POA: Diagnosis not present

## 2018-07-26 DIAGNOSIS — Z87891 Personal history of nicotine dependence: Secondary | ICD-10-CM | POA: Diagnosis not present

## 2018-07-26 DIAGNOSIS — Z85828 Personal history of other malignant neoplasm of skin: Secondary | ICD-10-CM | POA: Diagnosis not present

## 2018-07-26 DIAGNOSIS — M2021 Hallux rigidus, right foot: Secondary | ICD-10-CM | POA: Diagnosis not present

## 2018-07-26 LAB — BASIC METABOLIC PANEL
Anion gap: 9 (ref 5–15)
BUN: 23 mg/dL (ref 8–23)
CO2: 22 mmol/L (ref 22–32)
CREATININE: 1.54 mg/dL — AB (ref 0.61–1.24)
Calcium: 9.4 mg/dL (ref 8.9–10.3)
Chloride: 108 mmol/L (ref 98–111)
GFR calc Af Amer: 53 mL/min — ABNORMAL LOW (ref >60)
GFR calc non Af Amer: 46 mL/min — ABNORMAL LOW (ref >60)
Glucose, Bld: 107 mg/dL — ABNORMAL HIGH (ref 70–99)
Potassium: 3.9 mmol/L (ref 3.5–5.1)
Sodium: 139 mmol/L (ref 135–145)

## 2018-07-27 ENCOUNTER — Encounter (HOSPITAL_BASED_OUTPATIENT_CLINIC_OR_DEPARTMENT_OTHER)
Admission: RE | Disposition: A | Discharge status: Home / Self Care | Payer: Self-pay | Source: Home / Self Care | Attending: Orthopedic Surgery

## 2018-07-27 ENCOUNTER — Other Ambulatory Visit: Payer: Self-pay

## 2018-07-27 ENCOUNTER — Ambulatory Visit (HOSPITAL_BASED_OUTPATIENT_CLINIC_OR_DEPARTMENT_OTHER): Payer: Medicare Other | Admitting: Certified Registered"

## 2018-07-27 ENCOUNTER — Ambulatory Visit (HOSPITAL_BASED_OUTPATIENT_CLINIC_OR_DEPARTMENT_OTHER)
Admission: RE | Admit: 2018-07-27 | Discharge: 2018-07-27 | Disposition: A | Discharge status: Home / Self Care | Payer: Medicare Other | Attending: Orthopedic Surgery | Admitting: Orthopedic Surgery

## 2018-07-27 ENCOUNTER — Encounter (HOSPITAL_BASED_OUTPATIENT_CLINIC_OR_DEPARTMENT_OTHER): Payer: Self-pay

## 2018-07-27 DIAGNOSIS — Z881 Allergy status to other antibiotic agents status: Secondary | ICD-10-CM | POA: Insufficient documentation

## 2018-07-27 DIAGNOSIS — Z79890 Hormone replacement therapy (postmenopausal): Secondary | ICD-10-CM | POA: Diagnosis not present

## 2018-07-27 DIAGNOSIS — M2021 Hallux rigidus, right foot: Secondary | ICD-10-CM | POA: Diagnosis not present

## 2018-07-27 DIAGNOSIS — Z6832 Body mass index (BMI) 32.0-32.9, adult: Secondary | ICD-10-CM | POA: Insufficient documentation

## 2018-07-27 DIAGNOSIS — E669 Obesity, unspecified: Secondary | ICD-10-CM | POA: Insufficient documentation

## 2018-07-27 DIAGNOSIS — I10 Essential (primary) hypertension: Secondary | ICD-10-CM | POA: Insufficient documentation

## 2018-07-27 DIAGNOSIS — Z87891 Personal history of nicotine dependence: Secondary | ICD-10-CM | POA: Diagnosis not present

## 2018-07-27 DIAGNOSIS — Z85828 Personal history of other malignant neoplasm of skin: Secondary | ICD-10-CM | POA: Diagnosis not present

## 2018-07-27 DIAGNOSIS — Z888 Allergy status to other drugs, medicaments and biological substances status: Secondary | ICD-10-CM | POA: Insufficient documentation

## 2018-07-27 DIAGNOSIS — Z7951 Long term (current) use of inhaled steroids: Secondary | ICD-10-CM | POA: Insufficient documentation

## 2018-07-27 DIAGNOSIS — Z79899 Other long term (current) drug therapy: Secondary | ICD-10-CM | POA: Diagnosis not present

## 2018-07-27 DIAGNOSIS — G8918 Other acute postprocedural pain: Secondary | ICD-10-CM | POA: Diagnosis not present

## 2018-07-27 DIAGNOSIS — G709 Myoneural disorder, unspecified: Secondary | ICD-10-CM | POA: Diagnosis not present

## 2018-07-27 DIAGNOSIS — J45909 Unspecified asthma, uncomplicated: Secondary | ICD-10-CM | POA: Diagnosis not present

## 2018-07-27 DIAGNOSIS — K219 Gastro-esophageal reflux disease without esophagitis: Secondary | ICD-10-CM | POA: Diagnosis not present

## 2018-07-27 DIAGNOSIS — E039 Hypothyroidism, unspecified: Secondary | ICD-10-CM | POA: Insufficient documentation

## 2018-07-27 HISTORY — PX: ARTHRODESIS METATARSALPHALANGEAL JOINT (MTPJ): SHX6566

## 2018-07-27 SURGERY — FUSION, JOINT, GREAT TOE
Anesthesia: General | Site: Foot | Laterality: Right

## 2018-07-27 MED ORDER — PROPOFOL 10 MG/ML IV BOLUS
INTRAVENOUS | Status: DC | PRN
  Administered 2018-07-27: 150 mg via INTRAVENOUS

## 2018-07-27 MED ORDER — OXYCODONE HCL 5 MG/5ML PO SOLN: 5.0000 mg | Freq: Once | ORAL | Status: DC | PRN

## 2018-07-27 MED ORDER — LIDOCAINE HCL (CARDIAC) PF 100 MG/5ML IV SOSY
PREFILLED_SYRINGE | INTRAVENOUS | Status: DC | PRN
  Administered 2018-07-27: 30 mg via INTRAVENOUS

## 2018-07-27 MED ORDER — BUPIVACAINE HCL (PF) 0.5 % IJ SOLN
INTRAMUSCULAR | Status: DC | PRN
  Administered 2018-07-27: 30 mL via PERINEURAL

## 2018-07-27 MED ORDER — BUPIVACAINE HCL (PF) 0.5 % IJ SOLN
INTRAMUSCULAR | Status: AC
  Filled 2018-07-27: qty 30

## 2018-07-27 MED ORDER — SCOPOLAMINE 1 MG/3DAYS TD PT72: 1.0000 | MEDICATED_PATCH | Freq: Once | TRANSDERMAL | Status: DC | PRN

## 2018-07-27 MED ORDER — MIDAZOLAM HCL 2 MG/2ML IJ SOLN
INTRAMUSCULAR | Status: AC
  Filled 2018-07-27: qty 2

## 2018-07-27 MED ORDER — OXYCODONE HCL 5 MG PO TABS: 5.0000 mg | ORAL_TABLET | Freq: Once | ORAL | Status: DC | PRN

## 2018-07-27 MED ORDER — ONDANSETRON HCL 4 MG/2ML IJ SOLN
INTRAMUSCULAR | Status: DC | PRN
  Administered 2018-07-27: 4 mg via INTRAVENOUS

## 2018-07-27 MED ORDER — MIDAZOLAM HCL 2 MG/2ML IJ SOLN
1.0000 mg | INTRAMUSCULAR | Status: DC | PRN
  Administered 2018-07-27: 1 mg via INTRAVENOUS

## 2018-07-27 MED ORDER — OXYCODONE HCL 5 MG PO TABS: 5.0000 mg | ORAL_TABLET | Freq: Four times a day (QID) | ORAL | 0 refills | Status: AC | PRN

## 2018-07-27 MED ORDER — 0.9 % SODIUM CHLORIDE (POUR BTL) OPTIME
TOPICAL | Status: DC | PRN
  Administered 2018-07-27: 120 mL

## 2018-07-27 MED ORDER — CEFAZOLIN SODIUM-DEXTROSE 2-4 GM/100ML-% IV SOLN
INTRAVENOUS | Status: AC
  Filled 2018-07-27: qty 100

## 2018-07-27 MED ORDER — CHLORHEXIDINE GLUCONATE 4 % EX LIQD: 60.0000 mL | Freq: Once | CUTANEOUS | Status: DC

## 2018-07-27 MED ORDER — SODIUM CHLORIDE 0.9 % IV SOLN: INTRAVENOUS | Status: DC

## 2018-07-27 MED ORDER — ONDANSETRON HCL 4 MG/2ML IJ SOLN: 4.0000 mg | Freq: Four times a day (QID) | INTRAMUSCULAR | Status: DC | PRN

## 2018-07-27 MED ORDER — FENTANYL CITRATE (PF) 100 MCG/2ML IJ SOLN
INTRAMUSCULAR | Status: AC
  Filled 2018-07-27: qty 2

## 2018-07-27 MED ORDER — LACTATED RINGERS IV SOLN
INTRAVENOUS | Status: DC
  Administered 2018-07-27 (×2): via INTRAVENOUS

## 2018-07-27 MED ORDER — BUPIVACAINE-EPINEPHRINE (PF) 0.25% -1:200000 IJ SOLN
INTRAMUSCULAR | Status: AC
  Filled 2018-07-27: qty 30

## 2018-07-27 MED ORDER — FENTANYL CITRATE (PF) 100 MCG/2ML IJ SOLN: 25.0000 ug | INTRAMUSCULAR | Status: DC | PRN

## 2018-07-27 MED ORDER — CEFAZOLIN SODIUM-DEXTROSE 2-4 GM/100ML-% IV SOLN
2.0000 g | INTRAVENOUS | Status: AC
  Administered 2018-07-27: 2 g via INTRAVENOUS

## 2018-07-27 MED ORDER — PROPOFOL 500 MG/50ML IV EMUL
INTRAVENOUS | Status: DC | PRN
  Administered 2018-07-27: 25 ug/kg/min via INTRAVENOUS

## 2018-07-27 MED ORDER — DEXAMETHASONE SODIUM PHOSPHATE 10 MG/ML IJ SOLN
INTRAMUSCULAR | Status: DC | PRN
  Administered 2018-07-27: 10 mg via INTRAVENOUS

## 2018-07-27 MED ORDER — FENTANYL CITRATE (PF) 100 MCG/2ML IJ SOLN
50.0000 ug | INTRAMUSCULAR | Status: DC | PRN
  Administered 2018-07-27: 50 ug via INTRAVENOUS

## 2018-07-27 SURGICAL SUPPLY — 87 items
BANDAGE ACE 4X5 VEL STRL LF (GAUZE/BANDAGES/DRESSINGS) ×2 IMPLANT
BANDAGE ESMARK 6X9 LF (GAUZE/BANDAGES/DRESSINGS) IMPLANT
BIT DRILL 2.0 (BIT) ×2
BIT DRILL 2.0MM (BIT) ×1
BIT DRILL 2.9 CANN QC NONSTRL (BIT) ×2 IMPLANT
BIT DRILL 2XNS DISP SS SM FRAG (BIT) IMPLANT
BIT DRL 2XNS DISP SS SM FRAG (BIT) ×1
BLADE AVERAGE 25MMX9MM (BLADE)
BLADE AVERAGE 25X9 (BLADE) IMPLANT
BLADE MICRO SAGITTAL (BLADE) IMPLANT
BLADE OSC/SAG .038X5.5 CUT EDG (BLADE) IMPLANT
BLADE SURG 15 STRL LF DISP TIS (BLADE) ×2 IMPLANT
BLADE SURG 15 STRL SS (BLADE) ×9
BNDG CMPR 9X6 STRL LF SNTH (GAUZE/BANDAGES/DRESSINGS)
BNDG COHESIVE 4X5 TAN STRL (GAUZE/BANDAGES/DRESSINGS) IMPLANT
BNDG COHESIVE 6X5 TAN STRL LF (GAUZE/BANDAGES/DRESSINGS) IMPLANT
BNDG CONFORM 3 STRL LF (GAUZE/BANDAGES/DRESSINGS) ×3 IMPLANT
BNDG ESMARK 6X9 LF (GAUZE/BANDAGES/DRESSINGS)
BOOT STEPPER DURA LG (SOFTGOODS) ×2 IMPLANT
BOOT STEPPER DURA MED (SOFTGOODS) IMPLANT
BOOT STEPPER DURA SM (SOFTGOODS) IMPLANT
BOOT STEPPER DURA XLG (SOFTGOODS) IMPLANT
CHLORAPREP W/TINT 26ML (MISCELLANEOUS) ×3 IMPLANT
COVER BACK TABLE 60X90IN (DRAPES) ×3 IMPLANT
COVER WAND RF STERILE (DRAPES) IMPLANT
CUFF TOURNIQUET SINGLE 34IN LL (TOURNIQUET CUFF) ×2 IMPLANT
DRAPE EXTREMITY T 121X128X90 (DISPOSABLE) ×3 IMPLANT
DRAPE OEC MINIVIEW 54X84 (DRAPES) ×3 IMPLANT
DRAPE U-SHAPE 47X51 STRL (DRAPES) ×3 IMPLANT
DRSG MEPITEL 4X7.2 (GAUZE/BANDAGES/DRESSINGS) ×3 IMPLANT
DRSG PAD ABDOMINAL 8X10 ST (GAUZE/BANDAGES/DRESSINGS) ×6 IMPLANT
ELECT REM PT RETURN 9FT ADLT (ELECTROSURGICAL) ×3
ELECTRODE REM PT RTRN 9FT ADLT (ELECTROSURGICAL) ×1 IMPLANT
GAUZE SPONGE 4X4 12PLY STRL (GAUZE/BANDAGES/DRESSINGS) ×3 IMPLANT
GLOVE BIO SURGEON STRL SZ 6.5 (GLOVE) ×1 IMPLANT
GLOVE BIO SURGEON STRL SZ8 (GLOVE) ×3 IMPLANT
GLOVE BIO SURGEONS STRL SZ 6.5 (GLOVE) ×1
GLOVE BIOGEL PI IND STRL 7.0 (GLOVE) IMPLANT
GLOVE BIOGEL PI IND STRL 8 (GLOVE) ×2 IMPLANT
GLOVE BIOGEL PI INDICATOR 7.0 (GLOVE) ×6
GLOVE BIOGEL PI INDICATOR 8 (GLOVE) ×2
GLOVE ECLIPSE 8.0 STRL XLNG CF (GLOVE) ×1 IMPLANT
GLOVE SURG SS PI 7.0 STRL IVOR (GLOVE) ×2 IMPLANT
GOWN STRL REUS W/ TWL LRG LVL3 (GOWN DISPOSABLE) ×1 IMPLANT
GOWN STRL REUS W/ TWL XL LVL3 (GOWN DISPOSABLE) ×2 IMPLANT
GOWN STRL REUS W/TWL LRG LVL3 (GOWN DISPOSABLE) ×6
GOWN STRL REUS W/TWL XL LVL3 (GOWN DISPOSABLE) ×3
K-WIRE ACE 1.6X6 (WIRE) ×9
KWIRE ACE 1.6X6 (WIRE) IMPLANT
NEEDLE HYPO 22GX1.5 SAFETY (NEEDLE) IMPLANT
PACK BASIN DAY SURGERY FS (CUSTOM PROCEDURE TRAY) ×3 IMPLANT
PAD CAST 4YDX4 CTTN HI CHSV (CAST SUPPLIES) ×1 IMPLANT
PADDING CAST ABS 4INX4YD NS (CAST SUPPLIES)
PADDING CAST ABS COTTON 4X4 ST (CAST SUPPLIES) IMPLANT
PADDING CAST COTTON 4X4 STRL (CAST SUPPLIES) ×3
PADDING CAST COTTON 6X4 STRL (CAST SUPPLIES) IMPLANT
PENCIL BUTTON HOLSTER BLD 10FT (ELECTRODE) ×3 IMPLANT
PLATE 2H 2.7 T-SHAPED (Plate) ×2 IMPLANT
SANITIZER HAND PURELL 535ML FO (MISCELLANEOUS) ×3 IMPLANT
SCREW ACE CAN 4.0 34M (Screw) ×2 IMPLANT
SCREW ACE CAN 4.0 48M (Screw) ×2 IMPLANT
SCREW CORTICAL 2.7 MM 22MM (Screw) ×4 IMPLANT
SCREW CORTICAL 2.7MM  18MM (Screw) ×2 IMPLANT
SCREW CORTICAL 2.7MM  24MM (Screw) ×2 IMPLANT
SCREW CORTICAL 2.7MM 18MM (Screw) IMPLANT
SCREW CORTICAL 2.7MM 24MM (Screw) IMPLANT
SHEET MEDIUM DRAPE 40X70 STRL (DRAPES) ×3 IMPLANT
SLEEVE SCD COMPRESS KNEE MED (MISCELLANEOUS) ×3 IMPLANT
SPLINT FAST PLASTER 5X30 (CAST SUPPLIES)
SPLINT PLASTER CAST FAST 5X30 (CAST SUPPLIES) IMPLANT
SPONGE LAP 18X18 RF (DISPOSABLE) ×3 IMPLANT
SPONGE SURGIFOAM ABS GEL 12-7 (HEMOSTASIS) IMPLANT
STOCKINETTE 6  STRL (DRAPES) ×2
STOCKINETTE 6 STRL (DRAPES) ×1 IMPLANT
SUCTION FRAZIER HANDLE 10FR (MISCELLANEOUS) ×2
SUCTION TUBE FRAZIER 10FR DISP (MISCELLANEOUS) ×1 IMPLANT
SUT ETHILON 3 0 PS 1 (SUTURE) ×3 IMPLANT
SUT MNCRL AB 3-0 PS2 18 (SUTURE) ×3 IMPLANT
SUT VIC AB 0 SH 27 (SUTURE) IMPLANT
SUT VIC AB 2-0 SH 27 (SUTURE) ×3
SUT VIC AB 2-0 SH 27XBRD (SUTURE) ×1 IMPLANT
SYR BULB 3OZ (MISCELLANEOUS) ×3 IMPLANT
SYR CONTROL 10ML LL (SYRINGE) IMPLANT
TOWEL GREEN STERILE FF (TOWEL DISPOSABLE) ×6 IMPLANT
TUBE CONNECTING 20'X1/4 (TUBING) ×1
TUBE CONNECTING 20X1/4 (TUBING) ×2 IMPLANT
UNDERPAD 30X30 (UNDERPADS AND DIAPERS) ×3 IMPLANT

## 2018-07-27 NOTE — Transfer of Care (Signed)
Immediate Anesthesia Transfer of Care Note  Patient: Adrian [name]  Procedure(s) Performed: Right hallux metatarsal phalangeal joint arthrodesis (Right Foot)  Patient Location: PACU  Anesthesia Type:GA combined with regional for post-op pain  Level of Consciousness: awake, alert , oriented and patient cooperative  Airway & Oxygen Therapy: Patient Spontanous Breathing and Patient connected to face mask oxygen  Post-op Assessment: Report given to RN and Post -op Vital signs reviewed and stable  Post vital signs: Reviewed and stable  Last Vitals:  Vitals Value Taken Time  BP    Temp    Pulse 69 07/27/2018  8:40 AM  Resp 11 07/27/2018  8:40 AM  SpO2 99 % 07/27/2018  8:40 AM  Vitals shown include unvalidated device data.  Last Pain:  Vitals:   07/27/18 0643  TempSrc: Oral  PainSc: 0-No pain         Complications: No apparent anesthesia complications

## 2018-07-27 NOTE — Anesthesia Preprocedure Evaluation (Signed)
Anesthesia Evaluation  Patient identified by MRN, date of birth, ID band Patient awake    Reviewed: Allergy & Precautions, H&P , NPO status , Patient's Chart, lab work & pertinent test results  Airway Mallampati: II   Neck ROM: full    Dental   Pulmonary asthma , former smoker,    breath sounds clear to auscultation       Cardiovascular hypertension,  Rhythm:regular Rate:Normal     Neuro/Psych  Neuromuscular disease    GI/Hepatic GERD  ,  Endo/Other  Hypothyroidism obese  Renal/GU      Musculoskeletal  (+) Arthritis ,   Abdominal   Peds  Hematology   Anesthesia Other Findings   Reproductive/Obstetrics                             Anesthesia Physical Anesthesia Plan  ASA: II  Anesthesia Plan: General   Post-op Pain Management:  Regional for Post-op pain   Induction: Intravenous  PONV Risk Score and Plan: 1 and Ondansetron, Midazolam and Treatment may vary due to age or medical condition  Airway Management Planned: LMA  Additional Equipment:   Intra-op Plan:   Post-operative Plan:   Informed Consent: I have reviewed the patients History and Physical, chart, labs and discussed the procedure including the risks, benefits and alternatives for the proposed anesthesia with the patient or authorized representative who has indicated his/her understanding and acceptance.       Plan Discussed with: CRNA, Anesthesiologist and Surgeon  Anesthesia Plan Comments:         Anesthesia Quick Evaluation

## 2018-07-27 NOTE — Op Note (Signed)
07/27/2018  9:02 AM  PATIENT:  Adrian [name]  68 y.o. male  PRE-OPERATIVE DIAGNOSIS:  Right foot hallux rigidus  POST-OPERATIVE DIAGNOSIS:  Right foot hallux rigidus  Procedure(s): 1.  Right hallux metatarsal phalangeal joint arthrodesis 2.  Right foot AP and lateral xrays  SURGEON:  Wylene Simmer, MD  ASSISTANT: none  ANESTHESIA:   General, regional  EBL:  minimal   TOURNIQUET:   Total Tourniquet Time Documented: Thigh (Right) - 43 minutes Total: Thigh (Right) - 43 minutes  COMPLICATIONS:  None apparent  DISPOSITION:  Extubated, awake and stable to recovery.  INDICATION FOR PROCEDURE: The patient is a 68 year old male with a long history of right forefoot pain due to hallux rigidus.  He has failed nonoperative treatment and presents today for left hallux MP joint arthrodesis.  The risks and benefits of the alternative treatment options have been discussed in detail.  The patient wishes to proceed with surgery and specifically understands risks of bleeding, infection, nerve damage, blood clots, need for additional surgery, amputation and death.    PROCEDURE IN DETAIL:  After pre operative consent was obtained, and the correct operative site was identified, the patient was brought to the operating room and placed supine on the OR table.  Anesthesia was administered.  Pre-operative antibiotics were administered.  A surgical timeout was taken.  The right lower extremity was prepped and draped in standard sterile fashion.  The extremity was elevated and the tourniquet was inflated to 250 mmHg.  A longitudinal incision was made over the hallux MP joint.  Dissection was carried down through the subcutaneous tissues.  Care was taken to protect the extensor tendons.  Collateral ligaments were released and the metatarsal head was exposed.  Peripheral osteophytes were removed with a rondure.  A concave reamer was used to remove the remaining articular cartilage and subchondral bone.  A convex  reamer was used to remove the remaining articular cartilage and subchondral bone from the base of the proximal phalanx.  The wound was irrigated copiously.  The joint was reduced and provisionally pinned.  Radiographs confirmed appropriate alignment.  The joint was compressed with a 4 mm partially-threaded cannulated screw from the Biomet screw set.  A 5 hole T plate from the mini frag set was then placed over the dorsal aspect of the joint and secured proximally and distally with 2 bicortical screws.  AP and lateral radiographs confirmed appropriate position and length of all hardware and appropriate alignment of the joint.  The wound was irrigated copiously.  Subcutaneous tissues were approximated with Monocryl and Vicryl.  The skin incision was closed with nylon.  Sterile dressings were applied followed by a cam walker boot.  The tourniquet was released after application of the dressings.  The patient was awakened from anesthesia and transported to the recovery room in stable condition.   FOLLOW UP PLAN: Weightbearing as tolerated on the heel in a cam boot.  Follow-up in 2 weeks for suture removal.   RADIOGRAPHS: AP and lateral radiographs of the right foot are obtained intraoperatively.  These show interval arthrodesis of the right hallux MP joint.  Hardware is appropriately positioned and of the appropriate lengths.

## 2018-07-27 NOTE — Progress Notes (Signed)
Assisted Dr. Hodierne with right, ultrasound guided, popliteal block. Side rails up, monitors on throughout procedure. See vital signs in flow sheet. Tolerated Procedure well. 

## 2018-07-27 NOTE — Anesthesia Procedure Notes (Signed)
Anesthesia Regional Block: Popliteal block   Pre-Anesthetic Checklist: ,, timeout performed, Correct Patient, Correct Site, Correct Laterality, Correct Procedure, Correct Position, site marked, Risks and benefits discussed,  Surgical consent,  Pre-op evaluation,  At surgeon's request and post-op pain management  Laterality: Right  Prep: chloraprep       Needles:  Injection technique: Single-shot  Needle Type: Echogenic Stimulator Needle          Additional Needles:   Procedures:, nerve stimulator,,,,,,,   Nerve Stimulator or Paresthesia:  Response: plantar flexion of foot, 0.45 mA,   Additional Responses:   Narrative:  Start time: 07/27/2018 7:06 AM End time: 07/27/2018 7:13 AM Injection made incrementally with aspirations every 5 mL.  Performed by: Personally  Anesthesiologist: Albertha Ghee, MD  Additional Notes: Functioning IV was confirmed and monitors were applied.  A 16mm 21ga Arrow echogenic stimulator needle was used. Sterile prep and drape,hand hygiene and sterile gloves were used.  Negative aspiration and negative test dose prior to incremental administration of local anesthetic. The patient tolerated the procedure well.  Ultrasound guidance: relevent anatomy identified, needle position confirmed, local anesthetic spread visualized around nerve(s), vascular puncture avoided.  Image printed for medical record.

## 2018-07-27 NOTE — Anesthesia Procedure Notes (Signed)
Procedure Name: LMA Insertion Date/Time: 07/27/2018 7:36 AM Performed by: Signe Colt, CRNA Pre-anesthesia Checklist: Patient identified, Emergency Drugs available, Suction available and Patient being monitored Patient Re-evaluated:Patient Re-evaluated prior to induction Oxygen Delivery Method: Circle system utilized Preoxygenation: Pre-oxygenation with 100% oxygen Induction Type: IV induction Ventilation: Mask ventilation without difficulty LMA: LMA inserted LMA Size: 4.0 Number of attempts: 1 Airway Equipment and Method: Bite block Placement Confirmation: positive ETCO2 Tube secured with: Tape Dental Injury: Teeth and Oropharynx as per pre-operative assessment

## 2018-07-27 NOTE — H&P (Signed)
Adrian [name] is an 68 y.o. male.   Chief Complaint: Right foot pain HPI: The patient is a 68 year old male with a past medical history significant for hypothyroidism.  He has chronic right forefoot pain due to hallux rigidus.  He has failed nonoperative treatment to date and presents today for surgery to correct this painful deformity.  Past Medical History:  Diagnosis Date  . Allergic rhinitis   . Cancer ([place]) 2016   basal cell skin cancer nose  . Cataract   . GERD (gastroesophageal reflux disease)   . Hyperlipidemia   . Hypertension   . Hypothyroidism 2010   mild  . Hypothyroidism   . Trigeminal neuralgia    L.  . Vitamin B12 deficiency 2010    Past Surgical History:  Procedure Laterality Date  . arthroscopic surgery for chondromalacia Left 1995  . CATARACT EXTRACTION W/ INTRAOCULAR LENS  IMPLANT, BILATERAL  2017  . COLONOSCOPY    . gamma knife surgery for trigeminal neuralgia  2005  . inguinal herniorrhahpy  1991   L.  Marland Kitchen POLYPECTOMY    . TONSILLECTOMY  1959  . TRIGEMINAL NERVE DECOMPRESSION  2008   Left  Dr. Claybon Jabs    Family History  Problem Relation Age of Onset  . Heart disease Mother 23       MI  . Diabetes Mother   . Prostate cancer Father 67       prostate  . Breast cancer Sister 37       breast ca  . Stroke Brother   . Colon cancer Neg Hx   . Esophageal cancer Neg Hx   . Rectal cancer Neg Hx   . Stomach cancer Neg Hx    Social History:  reports that he quit smoking about 26 years ago. He has never used smokeless tobacco. He reports current alcohol use of about 7.0 standard drinks of alcohol per week. He reports that he does not use drugs.  Allergies:  Allergies  Allergen Reactions  . Elavil [Amitriptyline Hcl]     constipation  . Tape Rash    Irritation  . Triple Antibiotic [Bacitracin-Neomycin-Polymyxin] Rash    Medications Prior to Admission  Medication Sig Dispense Refill  . fexofenadine (ALLEGRA) 180 MG tablet Take 180 mg by mouth  daily.    . fluticasone (FLONASE) 50 MCG/ACT nasal spray Place into both nostrils daily.    Marland Kitchen gabapentin (NEURONTIN) 100 MG capsule Take 2 capsules (200 mg total) by mouth at bedtime. 180 capsule 3  . levothyroxine (SYNTHROID, LEVOTHROID) 75 MCG tablet Take 1 tablet (75 mcg total) by mouth daily. 90 tablet 3  . losartan (COZAAR) 100 MG tablet TAKE 1/2 (ONE-HALF) TABLET BY MOUTH ONCE DAILY 45 tablet 0  . Misc Natural Products (TART CHERRY ADVANCED) CAPS Take by mouth daily.    . Multiple Vitamins-Minerals (CENTRUM SILVER ADULT 50+ PO) Take 1 tablet by mouth at bedtime.    . Probiotic Product (PROBIOTIC DAILY PO) Take 1 tablet by mouth at bedtime.    . traZODone (DESYREL) 50 MG tablet TAKE 1/2 TO 1 (ONE-HALF TO ONE) TABLET BY MOUTH AT BEDTIME AS NEEDED FOR SLEEP 90 tablet 3  . TURMERIC PO Take by mouth daily.    Marland Kitchen UNABLE TO FIND CBD oil    . VALERIAN ROOT PO Take 1,500 mg by mouth at bedtime.    . Vitamin D, Ergocalciferol, (DRISDOL) 1.25 MG (50000 UT) CAPS capsule TAKE 1 CAPSULE BY MOUTH ONCE A WEEK 12 capsule 0  .  Zinc 50 MG TABS Take 1 tablet by mouth every morning.      . halobetasol (ULTRAVATE) 0.05 % cream APPLY  CREAM TOPICALLY TWICE DAILY AS NEEDED 50 g 0    Results for orders placed or performed during the hospital encounter of 08/08/18 (from the past 48 hour(s))  Basic metabolic panel     Status: Abnormal   Collection Time: 07/26/18  8:44 AM  Result Value Ref Range   Sodium 139 135 - 145 mmol/L   Potassium 3.9 3.5 - 5.1 mmol/L   Chloride 108 98 - 111 mmol/L   CO2 22 22 - 32 mmol/L   Glucose, Bld 107 (H) 70 - 99 mg/dL   BUN 23 8 - 23 mg/dL   Creatinine, Ser 1.54 (H) 0.61 - 1.24 mg/dL   Calcium 9.4 8.9 - 10.3 mg/dL   GFR calc non Af Amer 46 (L) >60 mL/min   GFR calc Af Amer 53 (L) >60 mL/min   Anion gap 9 5 - 15    Comment: Performed at [facility] [address], [place], [place] [address]   No results found.  ROS no recent fever, chills, nausea, vomiting or changes  in his appetite  Blood pressure (!) 147/83, pulse (!) 55, temperature 98 F (36.7 C), temperature source Oral, resp. rate 11, height 5\' 10"  (1.778 m), weight 101.2 kg, SpO2 98 %. Physical Exam  Well-nourished well-developed man in no apparent distress.  Alert and oriented x4.  Mood and affect are normal.  Extraocular motions are intact.  Respirations are unlabored.  Gait is normal.  The right forefoot has tenderness to palpation over the hallux MP joint.  Skin is healthy and intact.  Pulses are palpable.  No lymphadenopathy.  Diminished range of motion at the hallux MP joint.  Assessment/Plan Right hallux rigidus -to the operating room today for right hallux MP joint arthrodesis.The risks and benefits of the alternative treatment options have been discussed in detail.  The patient wishes to proceed with surgery and specifically understands risks of bleeding, infection, nerve damage, blood clots, need for additional surgery, amputation and death.   Wylene Simmer, MD August 08, 2018, 7:22 AM

## 2018-07-27 NOTE — Discharge Instructions (Addendum)
Post Anesthesia Home Care Instructions  Activity: Get plenty of rest for the remainder of the day. A responsible individual must stay with you for 24 hours following the procedure.  For the next 24 hours, DO NOT: -Drive a car -Paediatric nurse -Drink alcoholic beverages -Take any medication unless instructed by your physician -Make any legal decisions or sign important papers.  Meals: Start with liquid foods such as gelatin or soup. Progress to regular foods as tolerated. Avoid greasy, spicy, heavy foods. If nausea and/or vomiting occur, drink only clear liquids until the nausea and/or vomiting subsides. Call your physician if vomiting continues.  Special Instructions/Symptoms: Your throat may feel dry or sore from the anesthesia or the breathing tube placed in your throat during surgery. If this causes discomfort, gargle with warm salt water. The discomfort should disappear within 24 hours.  If you had a scopolamine patch placed behind your ear for the management of post- operative nausea and/or vomiting:  1. The medication in the patch is effective for 72 hours, after which it should be removed.  Wrap patch in a tissue and discard in the trash. Wash hands thoroughly with soap and water. 2. You may remove the patch earlier than 72 hours if you experience unpleasant side effects which may include dry mouth, dizziness or visual disturbances. 3. Avoid touching the patch. Wash your hands with soap and water after contact with the patch.    Wylene Simmer, MD [place]  Please read the following information regarding your care after surgery.  Medications  You only need a prescription for the narcotic pain medicine (ex. oxycodone, Percocet, Norco).  All of the other medicines listed below are available over the counter. X Aleve 2 pills twice a day for the first 3 days after surgery. X acetominophen (Tylenol) 650 mg every 4-6 hours as you need for minor to moderate pain X  oxycodone as prescribed for severe pain  Narcotic pain medicine (ex. oxycodone, Percocet, Vicodin) will cause constipation.  To prevent this problem, take the following medicines while you are taking any pain medicine. X docusate sodium (Colace) 100 mg twice a day X senna (Senokot) 2 tablets twice a day  ? To help prevent blood clots, take a baby aspirin (81 mg) twice a day for two weeks after surgery.  You should also get up every hour while you are awake to move around.    Weight Bearing ? Bear weight when you are able on your operated leg or foot. X Bear weight only on your operated foot in the cam boot. ? Do not bear any weight on the operated leg or foot.  Cast / Splint / Dressing X Keep your splint, cast or dressing clean and dry.  Dont put anything (coat hanger, pencil, etc) down inside of it.  If it gets damp, use a hair dryer on the cool setting to dry it.  If it gets soaked, call the office to schedule an appointment for a cast change. ? Remove your dressing 3 days after surgery and cover the incisions with dry dressings.    After your dressing, cast or splint is removed; you may shower, but do not soak or scrub the wound.  Allow the water to run over it, and then gently pat it dry.  Swelling It is normal for you to have swelling where you had surgery.  To reduce swelling and pain, keep your toes above your nose for at least 3 days after surgery.  It may be necessary  to keep your foot or leg elevated for several weeks.  If it hurts, it should be elevated.  Follow Up Call my office at [phone] when you are discharged from the hospital or surgery center to schedule an appointment to be seen two weeks after surgery.  Call my office at [phone] if you develop a fever >101.5 F, nausea, vomiting, bleeding from the surgical site or severe pain.

## 2018-07-28 ENCOUNTER — Encounter (HOSPITAL_BASED_OUTPATIENT_CLINIC_OR_DEPARTMENT_OTHER): Payer: Self-pay | Admitting: Orthopedic Surgery

## 2018-07-28 NOTE — Anesthesia Postprocedure Evaluation (Signed)
Anesthesia Post Note  Patient: Adrian [name]  Procedure(s) Performed: Right hallux metatarsal phalangeal joint arthrodesis (Right Foot)     Patient location during evaluation: PACU Anesthesia Type: General Level of consciousness: awake and alert Pain management: pain level controlled Vital Signs Assessment: post-procedure vital signs reviewed and stable Respiratory status: spontaneous breathing, nonlabored ventilation, respiratory function stable and patient connected to nasal cannula oxygen Cardiovascular status: blood pressure returned to baseline and stable Postop Assessment: no apparent nausea or vomiting Anesthetic complications: no    Last Vitals:  Vitals:   07/27/18 0915 07/27/18 1000  BP: (!) 142/80 (!) 145/82  Pulse: (!) 58 62  Resp: 13 16  Temp:  36.7 C  SpO2: 97% 96%    Last Pain:  Vitals:   07/27/18 1000  TempSrc:   PainSc: 0-No pain                 Aaren Krog S

## 2018-08-23 ENCOUNTER — Other Ambulatory Visit: Payer: Self-pay | Admitting: Internal Medicine

## 2018-10-23 NOTE — Progress Notes (Signed)
[name] [address] [place], [place] [address] Phone: [phone] Subjective:   I [name] am serving as a Education administrator for Dr. Hulan Saas.   CC: right thumb pain   [id number]   06/06/2018 Patient given injection today.  Discussed icing regimen and home exercise.  Discussed which activities to do which was to avoid.  Increase activity slowly over the course of next several days.  Follow-up again in 4 to 8 weeks.  10/24/2018 Adrian [name] is a 68 y.o. male coming in with complaint of right thumb pain. Would like an injection.  Patient has known CMC arthritis of the thumb.  Since then unfortunately starting to affect daily activities, certain things such as lifting repetitively can be more difficult.  Rates the severity of pain is 9 out of 10 and throbbing.  Patient initially had more difficulty with doing more repetitive activity after having surgery on his foot.  Now seems to be better but because he has been home doing more activity around the house and causing more swelling and pain.     Past Medical History:  Diagnosis Date  . Allergic rhinitis   . Cancer ([place]) 2016   basal cell skin cancer nose  . Cataract   . GERD (gastroesophageal reflux disease)   . Hyperlipidemia   . Hypertension   . Hypothyroidism 2010   mild  . Hypothyroidism   . Trigeminal neuralgia    L.  . Vitamin B12 deficiency 2010   Past Surgical History:  Procedure Laterality Date  . ARTHRODESIS METATARSALPHALANGEAL JOINT (MTPJ) Right 07/27/2018   Procedure: Right hallux metatarsal phalangeal joint arthrodesis;  Surgeon: Wylene Simmer, MD;  Location: [facility];  Service: Orthopedics;  Laterality: Right;  . arthroscopic surgery for chondromalacia Left 1995  . CATARACT EXTRACTION W/ INTRAOCULAR LENS  IMPLANT, BILATERAL  2017  . COLONOSCOPY    . gamma knife surgery for trigeminal neuralgia  2005  . inguinal herniorrhahpy  1991   L.  Marland Kitchen POLYPECTOMY    .  TONSILLECTOMY  1959  . TRIGEMINAL NERVE DECOMPRESSION  2008   Left  Dr. Claybon Jabs   Social History   Socioeconomic History  . Marital status: Married    Spouse name: Not on file  . Number of children: Not on file  . Years of education: Not on file  . Highest education level: Not on file  Occupational History  . Occupation: retired    Comment: TEFL teacher  Social Needs  . Financial resource strain: Not on file  . Food insecurity:    Worry: Not on file    Inability: Not on file  . Transportation needs:    Medical: Not on file    Non-medical: Not on file  Tobacco Use  . Smoking status: Former Smoker    Last attempt to quit: 05/31/1992    Years since quitting: 26.4  . Smokeless tobacco: Never Used  Substance and Sexual Activity  . Alcohol use: Yes    Alcohol/week: 7.0 standard drinks    Types: 7 Glasses of wine per week    Comment: at least one drink per night; sometimes "we split a bottle of wine"  . Drug use: No  . Sexual activity: Yes  Lifestyle  . Physical activity:    Days per week: Not on file    Minutes per session: Not on file  . Stress: Not on file  Relationships  . Social connections:    Talks  on phone: Not on file    Gets together: Not on file    Attends religious service: Not on file    Active member of club or organization: Not on file    Attends meetings of clubs or organizations: Not on file    Relationship status: Not on file  Other Topics Concern  . Not on file  Social History Narrative   [employer]   Married x2: 12 years 9st; 15 years ('47) 2nd   One daughter, step daugfhter, step son   Likes to bike   Allergies  Allergen Reactions  . Elavil [Amitriptyline Hcl]     constipation  . Tape Rash    Irritation  . Triple Antibiotic [Bacitracin-Neomycin-Polymyxin] Rash   Family History  Problem Relation Age of Onset  . Heart disease Mother 67       MI  . Diabetes Mother   . Prostate cancer Father 55       prostate  . Breast cancer  Sister 27       breast ca  . Stroke Brother   . Colon cancer Neg Hx   . Esophageal cancer Neg Hx   . Rectal cancer Neg Hx   . Stomach cancer Neg Hx     Current Outpatient Medications (Endocrine & Metabolic):  .  levothyroxine (SYNTHROID, LEVOTHROID) 75 MCG tablet, Take 1 tablet (75 mcg total) by mouth daily.  Current Outpatient Medications (Cardiovascular):  .  losartan (COZAAR) 100 MG tablet, Take 1/2 (one-half) tablet by mouth once daily  Current Outpatient Medications (Respiratory):  .  fexofenadine (ALLEGRA) 180 MG tablet, Take 180 mg by mouth daily. .  fluticasone (FLONASE) 50 MCG/ACT nasal spray, Place into both nostrils daily.    Current Outpatient Medications (Other):  .  halobetasol (ULTRAVATE) 0.05 % cream, APPLY  CREAM TOPICALLY TWICE DAILY AS NEEDED .  Misc Natural Products (TART CHERRY ADVANCED) CAPS, Take by mouth daily. .  Multiple Vitamins-Minerals (CENTRUM SILVER ADULT 50+ PO), Take 1 tablet by mouth at bedtime. .  Probiotic Product (PROBIOTIC DAILY PO), Take 1 tablet by mouth at bedtime. .  traZODone (DESYREL) 50 MG tablet, TAKE 1/2 TO 1 (ONE-HALF TO ONE) TABLET BY MOUTH AT BEDTIME AS NEEDED FOR SLEEP .  TURMERIC PO, Take by mouth daily. Marland Kitchen  UNABLE TO FIND, CBD oil .  VALERIAN ROOT PO, Take 1,500 mg by mouth at bedtime. .  Vitamin D, Ergocalciferol, (DRISDOL) 1.25 MG (50000 UT) CAPS capsule, TAKE 1 CAPSULE BY MOUTH ONCE A WEEK .  Zinc 50 MG TABS, Take 1 tablet by mouth every morning.   .  gabapentin (NEURONTIN) 300 MG capsule, 1 pill nightly    Past medical history, social, surgical and family history all reviewed in electronic medical record.  No pertanent information unless stated regarding to the chief complaint.   Review of Systems:  No headache, visual changes, nausea, vomiting, diarrhea, constipation, dizziness, abdominal pain, skin rash, fevers, chills, night sweats, weight loss, swollen lymph nodes, body aches, joint swelling, muscle aches, chest pain,  shortness of breath, mood changes.   Objective  Blood pressure (!) 150/82, pulse (!) 57, height 5\' 10"  (1.778 m), weight 219 lb (99.3 kg), SpO2 97 %.    General: No apparent distress alert and oriented x3 mood and affect normal, dressed appropriately.  HEENT: Pupils equal, extraocular movements intact  Respiratory: Patient's speak in full sentences and does not appear short of breath  Cardiovascular: No lower extremity edema, non tender, no erythema  Skin: Warm dry  intact with no signs of infection or rash on extremities or on axial skeleton.  Abdomen: Soft nontender  Neuro: Cranial nerves II through XII are intact, neurovascularly intact in all extremities with 2+ DTRs and 2+ pulses.  Lymph: No lymphadenopathy of posterior or anterior cervical chain or axillae bilaterally.  Gait normal with good balance and coordination.  MSK:  Non tender with full range of motion and good stability and symmetric strength and tone of shoulders, elbows,  hip, knee and ankles bilaterally.  Wrist: Right Inspection normal with no visible erythema or swelling. Patient does have a positive grind test of the [facility] joint.  Mild swelling compared to the contralateral side.  Tender to palpation around the joint itself.  Procedure: Real-time Ultrasound Guided Injection of right CMC joint Device: GE Logiq Q7 Ultrasound guided injection is preferred based studies that show increased duration, increased effect, greater accuracy, decreased procedural pain, increased response rate, and decreased cost with ultrasound guided versus blind injection.  Verbal informed consent obtained.  Time-out conducted.  Noted no overlying erythema, induration, or other signs of local infection.  Skin prepped in a sterile fashion.  Local anesthesia: Topical Ethyl chloride.  With sterile technique and under real time ultrasound guidance: With a 25-gauge half inch needle injected with 0.5 cc of 0.5% Marcaine and 0.5 cc of Kenalog 40 mg/mL  Completed without difficulty  Pain immediately resolved suggesting accurate placement of the medication.  Advised to call if fevers/chills, erythema, induration, drainage, or persistent bleeding.  Images permanently stored and available for review in the ultrasound unit.  Impression: Technically successful ultrasound guided injection.   Impression and Recommendations:     This case required medical decision making of moderate complexity. The above documentation has been reviewed and is accurate and complete Lyndal Pulley, DO       Note: This dictation was prepared with Dragon dictation along with smaller phrase technology. Any transcriptional errors that result from this process are unintentional.

## 2018-10-24 ENCOUNTER — Other Ambulatory Visit: Payer: Self-pay

## 2018-10-24 ENCOUNTER — Encounter: Payer: Self-pay | Admitting: Family Medicine

## 2018-10-24 ENCOUNTER — Ambulatory Visit (INDEPENDENT_AMBULATORY_CARE_PROVIDER_SITE_OTHER): Payer: Medicare Other | Admitting: Family Medicine

## 2018-10-24 ENCOUNTER — Ambulatory Visit: Payer: Self-pay

## 2018-10-24 ENCOUNTER — Ambulatory Visit: Payer: Medicare Other | Admitting: Family Medicine

## 2018-10-24 VITALS — BP 150/82 | HR 57 | Ht 70.0 in | Wt 219.0 lb

## 2018-10-24 DIAGNOSIS — M79644 Pain in right finger(s): Secondary | ICD-10-CM | POA: Diagnosis not present

## 2018-10-24 DIAGNOSIS — G8929 Other chronic pain: Secondary | ICD-10-CM

## 2018-10-24 DIAGNOSIS — M19049 Primary osteoarthritis, unspecified hand: Secondary | ICD-10-CM

## 2018-10-24 MED ORDER — GABAPENTIN 300 MG PO CAPS: ORAL_CAPSULE | ORAL | 3 refills | Status: DC

## 2018-10-24 NOTE — Patient Instructions (Signed)
Good to see you  Injected the thumb today  Ice is your friend Try wearing the brace with a lot of activity  Stay active  Gabapentin 300mg  at night See me again in 2 months if you need me

## 2018-10-24 NOTE — Assessment & Plan Note (Signed)
Interventions previously: Patient last injection was June 06, 2018.   Interventions this visit: New injection given today Oct 24, 2018.  Custom bracing given today to help slow down progression. We discussed with patient the importance ergonomics, home exercises, icing regimen, and over-the-counter natural products.  Gabapentin prescribed   Future considerations: Discussed PRP or surgical intervention  Return to clinic: 6 to 8 weeks

## 2018-12-18 ENCOUNTER — Ambulatory Visit (INDEPENDENT_AMBULATORY_CARE_PROVIDER_SITE_OTHER): Payer: Medicare Other | Admitting: Internal Medicine

## 2018-12-18 ENCOUNTER — Other Ambulatory Visit (INDEPENDENT_AMBULATORY_CARE_PROVIDER_SITE_OTHER): Payer: Medicare Other

## 2018-12-18 ENCOUNTER — Other Ambulatory Visit: Payer: Self-pay

## 2018-12-18 ENCOUNTER — Encounter: Payer: Self-pay | Admitting: Internal Medicine

## 2018-12-18 VITALS — BP 136/84 | HR 53 | Temp 98.5°F | Ht 70.0 in | Wt 205.0 lb

## 2018-12-18 DIAGNOSIS — R7309 Other abnormal glucose: Secondary | ICD-10-CM | POA: Diagnosis not present

## 2018-12-18 DIAGNOSIS — I10 Essential (primary) hypertension: Secondary | ICD-10-CM

## 2018-12-18 DIAGNOSIS — R251 Tremor, unspecified: Secondary | ICD-10-CM | POA: Diagnosis not present

## 2018-12-18 DIAGNOSIS — E538 Deficiency of other specified B group vitamins: Secondary | ICD-10-CM

## 2018-12-18 DIAGNOSIS — E034 Atrophy of thyroid (acquired): Secondary | ICD-10-CM

## 2018-12-18 DIAGNOSIS — N529 Male erectile dysfunction, unspecified: Secondary | ICD-10-CM

## 2018-12-18 LAB — BASIC METABOLIC PANEL
BUN: 18 mg/dL (ref 6–23)
CO2: 27 mEq/L (ref 19–32)
Calcium: 9.5 mg/dL (ref 8.4–10.5)
Chloride: 105 mEq/L (ref 96–112)
Creatinine, Ser: 1.48 mg/dL (ref 0.40–1.50)
GFR: 47.27 mL/min — ABNORMAL LOW (ref >60.00)
Glucose, Bld: 104 mg/dL — ABNORMAL HIGH (ref 70–99)
Potassium: 4.2 mEq/L (ref 3.5–5.1)
Sodium: 140 mEq/L (ref 135–145)

## 2018-12-18 LAB — HEPATIC FUNCTION PANEL
ALT: 15 U/L (ref 0–53)
AST: 14 U/L (ref 0–37)
Albumin: 4.5 g/dL (ref 3.5–5.2)
Alkaline Phosphatase: 52 U/L (ref 39–117)
Bilirubin, Direct: 0.1 mg/dL (ref 0.0–0.3)
Total Bilirubin: 0.4 mg/dL (ref 0.2–1.2)
Total Protein: 7.1 g/dL (ref 6.0–8.3)

## 2018-12-18 LAB — TESTOSTERONE: Testosterone: 224.06 ng/dL — ABNORMAL LOW (ref 300.00–890.00)

## 2018-12-18 LAB — HEMOGLOBIN A1C: Hgb A1c MFr Bld: 5.8 % (ref 4.6–6.5)

## 2018-12-18 LAB — TSH: TSH: 3.53 u[IU]/mL (ref 0.35–4.50)

## 2018-12-18 NOTE — Assessment & Plan Note (Signed)
Labs

## 2018-12-18 NOTE — Assessment & Plan Note (Signed)
On B12 

## 2018-12-18 NOTE — Progress Notes (Signed)
Subjective:  Patient ID: Adrian [name], male    DOB: [birth year]  Age: 68 y.o. MRN: [id number]  CC: No chief complaint on file.   HPI Jomel Whittlesey presents for B12 def, elev creat, elev glu f/u Lost wt on diet  Outpatient Medications Prior to Visit  Medication Sig Dispense Refill  . fexofenadine (ALLEGRA) 180 MG tablet Take 180 mg by mouth daily.    . fluticasone (FLONASE) 50 MCG/ACT nasal spray Place into both nostrils daily.    Marland Kitchen gabapentin (NEURONTIN) 300 MG capsule 1 pill nightly 90 capsule 3  . halobetasol (ULTRAVATE) 0.05 % cream APPLY  CREAM TOPICALLY TWICE DAILY AS NEEDED 50 g 0  . levothyroxine (SYNTHROID, LEVOTHROID) 75 MCG tablet Take 1 tablet (75 mcg total) by mouth daily. 90 tablet 3  . losartan (COZAAR) 100 MG tablet Take 1/2 (one-half) tablet by mouth once daily 45 tablet 3  . Misc Natural Products (TART CHERRY ADVANCED) CAPS Take by mouth daily.    . Multiple Vitamins-Minerals (CENTRUM SILVER ADULT 50+ PO) Take 1 tablet by mouth at bedtime.    . Probiotic Product (PROBIOTIC DAILY PO) Take 1 tablet by mouth at bedtime.    . traZODone (DESYREL) 50 MG tablet TAKE 1/2 TO 1 (ONE-HALF TO ONE) TABLET BY MOUTH AT BEDTIME AS NEEDED FOR SLEEP 90 tablet 3  . TURMERIC PO Take by mouth daily.    Marland Kitchen VALERIAN ROOT PO Take 1,500 mg by mouth at bedtime.    . Vitamin D, Ergocalciferol, (DRISDOL) 1.25 MG (50000 UT) CAPS capsule TAKE 1 CAPSULE BY MOUTH ONCE A WEEK 12 capsule 0  . Zinc 50 MG TABS Take 1 tablet by mouth every morning.      Marland Kitchen UNABLE TO FIND CBD oil     No facility-administered medications prior to visit.     ROS: Review of Systems  Constitutional: Negative for appetite change, fatigue and unexpected weight change.  HENT: Negative for congestion, nosebleeds, sneezing, sore throat and trouble swallowing.   Eyes: Negative for itching and visual disturbance.  Respiratory: Negative for cough.   Cardiovascular: Negative for chest pain, palpitations and leg swelling.   Gastrointestinal: Negative for abdominal distention, blood in stool, diarrhea and nausea.  Genitourinary: Negative for frequency and hematuria.  Musculoskeletal: Negative for back pain, gait problem, joint swelling and neck pain.  Skin: Negative for rash.  Neurological: Negative for dizziness, tremors, speech difficulty and weakness.  Psychiatric/Behavioral: Negative for agitation, dysphoric mood, sleep disturbance and suicidal ideas. The patient is not nervous/anxious.     Objective:  BP 136/84 (BP Location: Left Arm, Patient Position: Sitting, Cuff Size: Large)   Pulse (!) 53   Temp 98.5 F (36.9 C) (Oral)   Ht 5\' 10"  (1.778 m)   Wt 205 lb (93 kg)   SpO2 96%   BMI 29.41 kg/m   BP Readings from Last 3 Encounters:  12/18/18 136/84  10/24/18 (!) 150/82  07/27/18 (!) 145/82    Wt Readings from Last 3 Encounters:  12/18/18 205 lb (93 kg)  10/24/18 219 lb (99.3 kg)  07/27/18 223 lb 1.7 oz (101.2 kg)    Physical Exam Constitutional:      General: He is not in acute distress.    Appearance: He is well-developed.     Comments: NAD  Eyes:     Conjunctiva/sclera: Conjunctivae normal.     Pupils: Pupils are equal, round, and reactive to light.  Neck:     Musculoskeletal: Normal range of motion.  Thyroid: No thyromegaly.     Vascular: No JVD.  Cardiovascular:     Rate and Rhythm: Normal rate and regular rhythm.     Heart sounds: Normal heart sounds. No murmur. No friction rub. No gallop.   Pulmonary:     Effort: Pulmonary effort is normal. No respiratory distress.     Breath sounds: Normal breath sounds. No wheezing or rales.  Chest:     Chest wall: No tenderness.  Abdominal:     General: Bowel sounds are normal. There is no distension.     Palpations: Abdomen is soft. There is no mass.     Tenderness: There is no abdominal tenderness. There is no guarding or rebound.  Musculoskeletal: Normal range of motion.        General: No tenderness.  Lymphadenopathy:      Cervical: No cervical adenopathy.  Skin:    General: Skin is warm and dry.     Findings: No rash.  Neurological:     Mental Status: He is alert and oriented to person, place, and time.     Cranial Nerves: No cranial nerve deficit.     Motor: No abnormal muscle tone.     Coordination: Coordination normal.     Gait: Gait normal.     Deep Tendon Reflexes: Reflexes are normal and symmetric.  Psychiatric:        Behavior: Behavior normal.        Thought Content: Thought content normal.        Judgment: Judgment normal.     Lab Results  Component Value Date   WBC 4.8 06/21/2018   HGB 15.1 06/21/2018   HCT 44.0 06/21/2018   PLT 147.0 (L) 06/21/2018   GLUCOSE 107 (H) 07/26/2018   CHOL 139 06/21/2018   TRIG 109.0 06/21/2018   HDL 39.20 06/21/2018   LDLDIRECT 66.0 05/10/2016   LDLCALC 78 06/21/2018   ALT 18 06/21/2018   AST 16 06/21/2018   NA 139 07/26/2018   K 3.9 07/26/2018   CL 108 07/26/2018   CREATININE 1.54 (H) 07/26/2018   BUN 23 07/26/2018   CO2 22 07/26/2018   TSH 3.33 06/21/2018   PSA 0.69 06/21/2018   INR 1.0 RATIO 09/18/2007   HGBA1C 5.6 12/04/2008    No results found.  Assessment & Plan:   There are no diagnoses linked to this encounter.   No orders of the defined types were placed in this encounter.    Follow-up: No follow-ups on file.  Walker Kehr, MD

## 2018-12-18 NOTE — Assessment & Plan Note (Signed)
F/u w/Dr Tat

## 2018-12-18 NOTE — Patient Instructions (Addendum)
If you have medicare related insurance (such as traditional Medicare, [employer], [employer], or similar), Please make an appointment at the scheduling desk with Sharee Pimple, the [employer], for your Wellness visit in this office, which is a benefit with your insurance.      These suggestions will probably help you to improve your metabolism if you are not overweight and to lose weight if you are overweight: 1.  Reduce your consumption of sugars and starches.  Eliminate high fructose corn syrup from your diet.  Reduce your consumption of processed foods.  For desserts try to have seasonal fruits, berries with with green, nuts, cheeses or dark chocolate with more than 70% cacao. 2.  Do not snack 3.  You do not have to eat breakfast.  If you choose to have breakfast-eat plain greek yogurt, eggs, oatmeal (without sugar) 4.  Drink water, freshly brewed unsweetened tea (green, black or herbal) or coffee.  Do not drink sodas including diet sodas , juices, beverages sweetened with artificial sweeteners. 5.  Reduce your consumption of refined grains. 6.  Avoid protein drinks such as Optifast, Slim fast etc. Eat chicken, fish, meat, dairy and beans for your sources of protein 7.  Natural unprocessed fats like cold pressed virgin olive oil, butter, coconut oil are good for you.  Eat avocados 8.  Increase your consumption of fiber.  Fruits, berries, vegetables, whole grains, flaxseeds, Chia seeds, beans, popcorn, nuts, oatmeal are good sources of fiber 9.  Use vinegar in your diet, i.e. apple cider vinegar, red wine or balsamic vinegar 10.  You can try fasting.  For example you can skip breakfast and lunch every other day (24-hour fast) 11.  Stress reduction, good night sleep, relaxation, meditation, yoga and other physical activity is likely to help you to maintain low weight too. 12.  If you drink alcohol, limit your alcohol intake to no more than 2 drinks a  day.   Mediterranean diet is good for you.  The Mediterranean diet is a way of eating based on the traditional cuisine of countries bordering the [employer]. While there is no single definition of the Mediterranean diet, it is typically high in vegetables, fruits, whole grains, beans, nut and seeds, and olive oil. The main components of Mediterranean diet include: Marland Kitchen Daily consumption of vegetables, fruits, whole grains and healthy fats  . Weekly intake of fish, poultry, beans and eggs  . Moderate portions of dairy products  . Limited intake of red meat Other important elements of the Mediterranean diet are sharing meals with family and friends, enjoying a glass of red wine and being physically active. Health benefits of a Mediterranean diet: A traditional Mediterranean diet consisting of large quantities of fresh fruits and vegetables, nuts, fish and olive oil-coupled with physical activity-can reduce your risk of serious mental and physical health problems by: Preventing heart disease and strokes. Following a Mediterranean diet limits your intake of refined breads, processed foods, and red meat, and encourages drinking red wine instead of hard liquor-all factors that can help prevent heart disease and stroke. Keeping you agile. If you're an older adult, the nutrients gained with a Mediterranean diet may reduce your risk of developing muscle weakness and other signs of frailty by about 70 percent. Reducing the risk of Alzheimer's. Research suggests that the [place] diet may improve cholesterol, blood sugar levels, and overall blood vessel health, which in turn may reduce your risk of Alzheimer's disease or dementia. Halving the risk of  Parkinson's disease. The high levels of antioxidants in the Mediterranean diet can prevent cells from undergoing a damaging process called oxidative stress, thereby cutting the risk of Parkinson's disease in half. Increasing longevity. By reducing your  risk of developing heart disease or cancer with the Mediterranean diet, you're reducing your risk of death at any age by 20%. Protecting against type 2 diabetes. A Mediterranean diet is rich in fiber which digests slowly, prevents huge swings in blood sugar, and can help you maintain a healthy weight.    Cabbage soup recipe that will not make you gain weight: Take 1 small head of CABG, 1 average pack of celery, 4 green peppers, 4 onions, 2 cans diced tomatoes (they are not available without salt), salt and spices to taste.  Chop cabbage, celery, peppers and onions.  And tomatoes and 2-2.5 liters (2.5 quarts) of water so that it would just cover the vegetables.  Bring to boil.  Add spices and salt.  Turn heat to low/medium and simmer for 20-25 minutes.  Naturally, you can make a smaller batch and change some of the ingredients.   Cardiac CT calcium scoring test $150   Computed tomography, more commonly known as a CT or CAT scan, is a diagnostic medical imaging test. Like traditional x-rays, it produces multiple images or pictures of the inside of the body. The cross-sectional images generated during a CT scan can be reformatted in multiple planes. They can even generate three-dimensional images. These images can be viewed on a computer monitor, printed on film or by a 3D printer, or transferred to a CD or DVD. CT images of internal organs, bones, soft tissue and blood vessels provide greater detail than traditional x-rays, particularly of soft tissues and blood vessels. A cardiac CT scan for coronary calcium is a non-invasive way of obtaining information about the presence, location and extent of calcified plaque in the coronary arteries-the vessels that supply oxygen-containing blood to the heart muscle. Calcified plaque results when there is a build-up of fat and other substances under the inner layer of the artery. This material can calcify which signals the presence of atherosclerosis, a disease of  the vessel wall, also called coronary artery disease (CAD). People with this disease have an increased risk for heart attacks. In addition, over time, progression of plaque build up (CAD) can narrow the arteries or even close off blood flow to the heart. The result may be chest pain, sometimes called "angina," or a heart attack. Because calcium is a marker of CAD, the amount of calcium detected on a cardiac CT scan is a helpful prognostic tool. The findings on cardiac CT are expressed as a calcium score. Another name for this test is coronary artery calcium scoring.  What are some common uses of the procedure? The goal of cardiac CT scan for calcium scoring is to determine if CAD is present and to what extent, even if there are no symptoms. It is a screening study that may be recommended by a physician for patients with risk factors for CAD but no clinical symptoms. The major risk factors for CAD are: . high blood cholesterol levels  . family history of heart attacks  . diabetes  . high blood pressure  . cigarette smoking  . overweight or obese  . physical inactivity   A negative cardiac CT scan for calcium scoring shows no calcification within the coronary arteries. This suggests that CAD is absent or so minimal it cannot be seen by this technique.  The chance of having a heart attack over the next two to five years is very low under these circumstances. A positive test means that CAD is present, regardless of whether or not the patient is experiencing any symptoms. The amount of calcification-expressed as the calcium score-may help to predict the likelihood of a myocardial infarction (heart attack) in the coming years and helps your medical doctor or cardiologist decide whether the patient may need to take preventive medicine or undertake other measures such as diet and exercise to lower the risk for heart attack. The extent of CAD is graded according to your calcium score:  Calcium Score   Presence of CAD (coronary artery disease)  0 No evidence of CAD   1-10 Minimal evidence of CAD  11-100 Mild evidence of CAD  101-400 Moderate evidence of CAD  Over 400 Extensive evidence of CAD

## 2018-12-18 NOTE — Assessment & Plan Note (Addendum)
Labs Chronic  Losartan A   cardiac CT scan for calcium scoring offered

## 2018-12-20 ENCOUNTER — Other Ambulatory Visit: Payer: Self-pay | Admitting: Internal Medicine

## 2018-12-20 DIAGNOSIS — E291 Testicular hypofunction: Secondary | ICD-10-CM

## 2018-12-20 DIAGNOSIS — N183 Chronic kidney disease, stage 3 unspecified: Secondary | ICD-10-CM

## 2018-12-20 NOTE — Assessment & Plan Note (Signed)
Renal US Hydration

## 2018-12-21 ENCOUNTER — Other Ambulatory Visit: Payer: Self-pay | Admitting: Internal Medicine

## 2018-12-21 NOTE — Addendum Note (Signed)
Addended by: Cresenciano Lick on: 12/21/2018 11:34 AM   Modules accepted: Orders

## 2019-01-03 ENCOUNTER — Other Ambulatory Visit: Payer: Medicare Other

## 2019-01-04 ENCOUNTER — Ambulatory Visit
Admission: RE | Admit: 2019-01-04 | Discharge: 2019-01-04 | Disposition: A | Discharge status: Home / Self Care | Payer: Medicare Other | Source: Ambulatory Visit | Attending: Internal Medicine | Admitting: Internal Medicine

## 2019-01-04 ENCOUNTER — Other Ambulatory Visit: Payer: Medicare Other

## 2019-01-04 DIAGNOSIS — N189 Chronic kidney disease, unspecified: Secondary | ICD-10-CM | POA: Diagnosis not present

## 2019-01-04 DIAGNOSIS — N183 Chronic kidney disease, stage 3 unspecified: Secondary | ICD-10-CM

## 2019-01-05 ENCOUNTER — Other Ambulatory Visit: Payer: Self-pay | Admitting: Internal Medicine

## 2019-01-05 DIAGNOSIS — N183 Chronic kidney disease, stage 3 unspecified: Secondary | ICD-10-CM

## 2019-01-15 ENCOUNTER — Ambulatory Visit: Payer: Medicare Other | Admitting: Podiatry

## 2019-01-15 ENCOUNTER — Other Ambulatory Visit: Payer: Self-pay

## 2019-01-15 ENCOUNTER — Encounter: Payer: Self-pay | Admitting: Podiatry

## 2019-01-15 VITALS — Temp 97.4°F

## 2019-01-15 DIAGNOSIS — L600 Ingrowing nail: Secondary | ICD-10-CM | POA: Diagnosis not present

## 2019-01-15 DIAGNOSIS — M205X1 Other deformities of toe(s) (acquired), right foot: Secondary | ICD-10-CM

## 2019-01-15 MED ORDER — NEOMYCIN-POLYMYXIN-HC 3.5-10000-1 OT SOLN: OTIC | 0 refills | Status: DC

## 2019-01-15 NOTE — Patient Instructions (Signed)

## 2019-01-15 NOTE — Progress Notes (Signed)
Subjective:   Patient ID: Adrian [name], male   DOB: 68 y.o.   MRN: [id number]   HPI Patient presents stating I want this nail removed and also I have had a fusion of right big toe right that I wanted checked   ROS      Objective:  Physical Exam  Neurovascular status intact with thick dystrophic hallux nail left that is painful to dorsal pressure with incision site that is healing well right first metatarsal of approximate 5 months duration with no current motion noted at the joint     Assessment:  Damage left hallux nail with thickness and pain and right hallux that shows history of fusion     Plan:  H&P reviewed both conditions and I have recommended removal of the left hallux nail.  Patient wants procedure understanding risk and today I went ahead and infiltrated the left hallux 60 mg like Marcaine mixture sterile prep applied and using sterile instrumentation remove the hallux nail exposed matrix and applied phenol 5 applications 30 seconds followed by alcohol lavage and sterile dressing.  Gave instructions on soaks and reappoint

## 2019-01-29 ENCOUNTER — Ambulatory Visit: Payer: Medicare Other | Admitting: Podiatry

## 2019-01-29 DIAGNOSIS — L821 Other seborrheic keratosis: Secondary | ICD-10-CM | POA: Diagnosis not present

## 2019-01-29 DIAGNOSIS — L503 Dermatographic urticaria: Secondary | ICD-10-CM | POA: Diagnosis not present

## 2019-01-29 DIAGNOSIS — Z85828 Personal history of other malignant neoplasm of skin: Secondary | ICD-10-CM | POA: Diagnosis not present

## 2019-01-29 DIAGNOSIS — D225 Melanocytic nevi of trunk: Secondary | ICD-10-CM | POA: Diagnosis not present

## 2019-02-01 ENCOUNTER — Other Ambulatory Visit: Payer: Self-pay

## 2019-02-01 ENCOUNTER — Ambulatory Visit (INDEPENDENT_AMBULATORY_CARE_PROVIDER_SITE_OTHER): Payer: Medicare Other

## 2019-02-01 DIAGNOSIS — Z23 Encounter for immunization: Secondary | ICD-10-CM

## 2019-02-12 ENCOUNTER — Ambulatory Visit: Payer: Medicare Other | Admitting: Podiatry

## 2019-03-02 NOTE — Progress Notes (Signed)
Adrian [name] was seen today in the movement disorders clinic for follow-up of resting tremor.  Patient is accompanied by his wife who supplements the history.  Records are reviewed since our last visit, including primary care records.  He had lab work after our last visit, which was unremarkable, although we did have a repeat B12 at his primary care doctor on January 22 and this was slightly low at 349.  Pt states that tremor is much better, although it is still there.  States that he stopped celexa and tremor is better.  He has some tremor still but not like previously.   He is taking CBD oil.    03/06/19 update: Patient seen today in follow-up for tremor.  The first visit I saw him, he had rest tremor in both legs, but the last visit I really did not see any tremor.  We decided to just closely follow him.  He emailed me about 6 weeks after I last saw him and stated that he wanted some medication.  I told him I would need a visit with him, since I had not seen tremor the visit prior.  I offered him an in person visit, video visit, and/or phone visit, but he did not schedule that, so today is the first time that I am seeing him since then.  Reports today that he has started noting tremor of the L arm at rest over the last few months.  No left leg tremor.  Balance has not been as good but no falls.  Dragging the L leg when walking.  Medical records have been reviewed since our last visit.  The patient was referred to nephrology for renal insufficiency and atrophy of the R kidney.  Pt states that he was told they will call him in 2 months as he wasn't as sick as the other patient.  PREVIOUS MEDICATIONS: none to date  ALLERGIES:   Allergies  Allergen Reactions  . Tape Rash    Irritation  . Elavil [Amitriptyline Hcl]     constipation  . Triple Antibiotic [Bacitracin-Neomycin-Polymyxin] Rash    CURRENT MEDICATIONS:  Outpatient Encounter Medications as of 03/06/2019  Medication Sig  . Cetirizine  HCl (ZYRTEC ALLERGY) 10 MG CAPS   . diphenhydrAMINE HCl (BENADRYL ALLERGY PO)   . fluticasone (FLONASE) 50 MCG/ACT nasal spray Place into both nostrils daily.  . halobetasol (ULTRAVATE) 0.05 % cream APPLY CREAM TOPICALLY 2 TIMES DAILY AS NEEDED  . levothyroxine (SYNTHROID, LEVOTHROID) 75 MCG tablet Take 1 tablet (75 mcg total) by mouth daily.  Marland Kitchen losartan (COZAAR) 100 MG tablet Take 1/2 (one-half) tablet by mouth once daily  . Misc Natural Products (TART CHERRY ADVANCED) CAPS Take by mouth daily.  . Multiple Vitamins-Minerals (CENTRUM SILVER ADULT 50+ PO) Take 1 tablet by mouth at bedtime.  . Probiotic Product (PROBIOTIC DAILY PO) Take 1 tablet by mouth at bedtime.  . traZODone (DESYREL) 50 MG tablet TAKE 1/2 TO 1 (ONE-HALF TO ONE) TABLET BY MOUTH AT BEDTIME AS NEEDED FOR SLEEP  . TURMERIC PO Take by mouth daily.  Marland Kitchen VALERIAN ROOT PO Take 1,500 mg by mouth at bedtime.  . Vitamin D, Ergocalciferol, (DRISDOL) 1.25 MG (50000 UT) CAPS capsule TAKE 1 CAPSULE BY MOUTH ONCE A WEEK  . Zinc 50 MG TABS Take 1 tablet by mouth every morning.    . fexofenadine (ALLEGRA) 180 MG tablet Take 180 mg by mouth daily.  Marland Kitchen gabapentin (NEURONTIN) 300 MG capsule 1 pill nightly  .  neomycin-polymyxin-hydrocortisone (CORTISPORIN) OTIC solution Apply 1 to 2 drops to toe BID after soaking   No facility-administered encounter medications on file as of 03/06/2019.     PAST MEDICAL HISTORY:   Past Medical History:  Diagnosis Date  . Allergic rhinitis   . Cancer ([place]) 2016   basal cell skin cancer nose  . Cataract   . GERD (gastroesophageal reflux disease)   . Hyperlipidemia   . Hypertension   . Hypothyroidism 2010   mild  . Hypothyroidism   . Trigeminal neuralgia    L.  . Vitamin B12 deficiency 2010    PAST SURGICAL HISTORY:   Past Surgical History:  Procedure Laterality Date  . ARTHRODESIS METATARSALPHALANGEAL JOINT (MTPJ) Right 07/27/2018   Procedure: Right hallux metatarsal phalangeal joint arthrodesis;   Surgeon: Wylene Simmer, MD;  Location: [facility];  Service: Orthopedics;  Laterality: Right;  . arthroscopic surgery for chondromalacia Left 1995  . CATARACT EXTRACTION W/ INTRAOCULAR LENS  IMPLANT, BILATERAL  2017  . COLONOSCOPY    . gamma knife surgery for trigeminal neuralgia  2005  . inguinal herniorrhahpy  1991   L.  Marland Kitchen POLYPECTOMY    . TONSILLECTOMY  1959  . TRIGEMINAL NERVE DECOMPRESSION  2008   Left  Dr. Claybon Jabs    SOCIAL HISTORY:   Social History   Socioeconomic History  . Marital status: Married    Spouse name: Not on file  . Number of children: Not on file  . Years of education: Not on file  . Highest education level: Not on file  Occupational History  . Occupation: retired    Comment: TEFL teacher  Social Needs  . Financial resource strain: Not on file  . Food insecurity    Worry: Not on file    Inability: Not on file  . Transportation needs    Medical: Not on file    Non-medical: Not on file  Tobacco Use  . Smoking status: Former Smoker    Quit date: 05/31/1992    Years since quitting: 26.7  . Smokeless tobacco: Never Used  Substance and Sexual Activity  . Alcohol use: Yes    Alcohol/week: 7.0 standard drinks    Types: 7 Glasses of wine per week    Comment: at least one drink per night; sometimes "we split a bottle of wine"  . Drug use: No  . Sexual activity: Yes  Lifestyle  . Physical activity    Days per week: Not on file    Minutes per session: Not on file  . Stress: Not on file  Relationships  . Social Herbalist on phone: Not on file    Gets together: Not on file    Attends religious service: Not on file    Active member of club or organization: Not on file    Attends meetings of clubs or organizations: Not on file    Relationship status: Not on file  . Intimate partner violence    Fear of current or ex partner: Not on file    Emotionally abused: Not on file    Physically abused: Not on file     Forced sexual activity: Not on file  Other Topics Concern  . Not on file  Social History Narrative   [employer]   Married x2: 12 years 49st; 15 years ('72) 2nd   One daughter, step daugfhter, step son   Likes to bike    FAMILY HISTORY:   Family Status  Relation  Name Status  . Mother  Deceased  . Father  Deceased  . Sister  Alive  . Brother  Deceased  . Daughter  Alive  . Other 2 step children Alive  . Neg Hx  (Not Specified)    ROS:  ROS   PHYSICAL EXAMINATION:    VITALS:   Vitals:   03/06/19 1333  BP: (!) 142/80  Pulse: 67  SpO2: 95%  Weight: 207 lb 14.4 oz (94.3 kg)  Height: 5\' 10"  (1.778 m)   GEN:  The patient appears stated age and is in NAD. HEENT:  Normocephalic, atraumatic.  The mucous membranes are moist. The superficial temporal arteries are without ropiness or tenderness. CV:  RRR Lungs:  CTAB Neck/HEME:  There are no carotid bruits bilaterally.  Neurological examination:  Orientation: The patient is alert and oriented x3. Cranial nerves: There is good facial symmetry. The speech is fluent and clear. Soft palate rises symmetrically and there is no tongue deviation. Hearing is intact to conversational tone. Sensation: Sensation is intact to light touch throughout Motor: Strength is 5/5 in the bilateral upper and lower extremities.   Shoulder shrug is equal and symmetric.  There is no pronator drift.  Movement examination: Tone: There is normal tone in the bilateral upper extremities.  The tone in the lower extremities is normal.  Abnormal movements: There is LUE/LLE tremor Coordination:  There is mild decremation, with any form of RAMS, including alternating supination and pronation of the forearm, hand opening and closing, finger taps, heel taps and toe taps on the left Gait and Station: The patient has no difficulty arising out of a deep-seated chair without the use of the hands. The patient's stride length is normal with slightly stooped posture.  The patient  has a negative pull test.      Labs:  Lab Results  Component Value Date   TSH 3.53 12/18/2018     Chemistry      Component Value Date/Time   NA 140 12/18/2018 0913   K 4.2 12/18/2018 0913   CL 105 12/18/2018 0913   CO2 27 12/18/2018 0913   BUN 18 12/18/2018 0913   CREATININE 1.48 12/18/2018 0913      Component Value Date/Time   CALCIUM 9.5 12/18/2018 0913   ALKPHOS 52 12/18/2018 0913   AST 14 12/18/2018 0913   ALT 15 12/18/2018 0913   BILITOT 0.4 12/18/2018 0913     Lab Results  Component Value Date   VITAMINB12 349 06/21/2018     ASSESSMENT/PLAN:  1.  Parkinsons disease.  Dx today.    -We discussed the diagnosis as well as pathophysiology of the disease.  We discussed treatment options as well as prognostic indicators.  Patient education was provided.  -We discussed that it used to be thought that levodopa would increase risk of melanoma but now it is believed that Parkinsons itself likely increases risk of melanoma. he is to get regular skin checks.  He is seeing dermatology.  - We talked about medication options as well as potential future surgical options.  We talked about safety in the home.  -We decided to add carbidopa/levodopa 25/100.  1/2 tab tid x 1 wk, then 1/2 in am & noon & 1 at night for a week, then 1/2 in am &1 at noon &night for a week, then 1 po tid.  He admits that he will need to talk it over with his wife before he decides to start it.  Risks, benefits, side effects and  alternative therapies were discussed.  The opportunity to ask questions was given and they were answered to the best of my ability.  The patient expressed understanding and willingness to follow the outlined treatment protocols.  -offered Education officer, museum support - he declined.   -told to make Video f/u if wife would like to discuss or has questions as she was not here today  -discussed importance of safe, CV exercise  -pt visibly upset but didn't want to stay to meet with social worker.     2.  History of left trigeminal neuralgia  -Resolved after decompression surgery (had gamma knife prior to that).  3.  Mild B12 deficiency  -on supplement  4. Renal insufficiency  -awaiting appt with nephrology   5.  Follow up is anticipated in the next 4-6 months, sooner should new neurologic issues arise.  Much greater than 50% of this visit was spent in counseling and coordinating care.  Total face to face time:  30 min  Cc:  Plotnikov, Evie Lacks, MD

## 2019-03-06 ENCOUNTER — Encounter: Payer: Self-pay | Admitting: Neurology

## 2019-03-06 ENCOUNTER — Other Ambulatory Visit: Payer: Self-pay

## 2019-03-06 ENCOUNTER — Ambulatory Visit: Payer: Medicare Other | Admitting: Neurology

## 2019-03-06 ENCOUNTER — Telehealth: Payer: Self-pay | Admitting: Neurology

## 2019-03-06 VITALS — BP 142/80 | HR 67 | Ht 70.0 in | Wt 207.9 lb

## 2019-03-06 DIAGNOSIS — G20 Parkinson's disease: Secondary | ICD-10-CM | POA: Diagnosis not present

## 2019-03-06 MED ORDER — CARBIDOPA-LEVODOPA 25-100 MG PO TABS: 1.0000 | ORAL_TABLET | Freq: Three times a day (TID) | ORAL | 1 refills | Status: DC

## 2019-03-06 NOTE — Telephone Encounter (Signed)
Called and left patient a message to call back to schedule a 4 month follow up with Dr. Carles Collet.   He also needs to speak with Judson Roch. Please transfer call to her after scheduling.

## 2019-03-06 NOTE — Patient Instructions (Addendum)
Start Carbidopa Levodopa as follows:  Take 1/2 tablet three times daily, at least 30 minutes before meals, for one week  Then take 1/2 tablet in the morning, 1/2 tablet in the afternoon, 1 tablet in the evening, at least 30 minutes before meals, for one week  Then take 1/2 tablet in the morning, 1 tablet in the afternoon, 1 tablet in the evening, at least 30 minutes before meals, for one week  Then take 1 tablet three times daily, at least 30 minutes before meals, at 8am/noon/4pm   As a reminder, carbidopa/levodopa can be taken at the same time as a carbohydrate, but we like to have you take your pill either 30 minutes before a protein source or 1 hour after as protein can interfere with carbidopa/levodopa absorption.  The physicians and staff at [facility] are committed to providing excellent care. You may receive a survey requesting feedback about your experience at our office. We strive to receive "very good" responses to the survey questions. If you feel that your experience would prevent you from giving the office a "very good " response, please contact our office to try to remedy the situation. We may be reached at [phone]. Thank you for taking the time out of your busy day to complete the survey.

## 2019-03-07 NOTE — Telephone Encounter (Signed)
Called and confirmed next appointment is on 07/05/2019 at 1:00 PM with Dr. Carles Collet.  Patient declined to be transferred to Judson Roch the Education officer, museum. He said he does not want to proceed with that service at this time. Routing to Judson Roch for review.

## 2019-03-09 NOTE — Telephone Encounter (Signed)
Rerouting to [employer] for review with Dr. Carles Collet.

## 2019-03-09 NOTE — Telephone Encounter (Signed)
Pt decline Sarah services F/u appt scheduled Please be aware

## 2019-03-13 ENCOUNTER — Other Ambulatory Visit: Payer: Self-pay

## 2019-03-13 DIAGNOSIS — G20 Parkinson's disease: Secondary | ICD-10-CM

## 2019-03-23 IMAGING — DX DG FOOT COMPLETE 3+V*R*
3 series · 3 of 3 positions shown · non-contrast
Comparison: None.

CLINICAL DATA: Right toe pain 5 days no injury

EXAM:
RIGHT FOOT COMPLETE - 3+ VIEW

[foot ap]
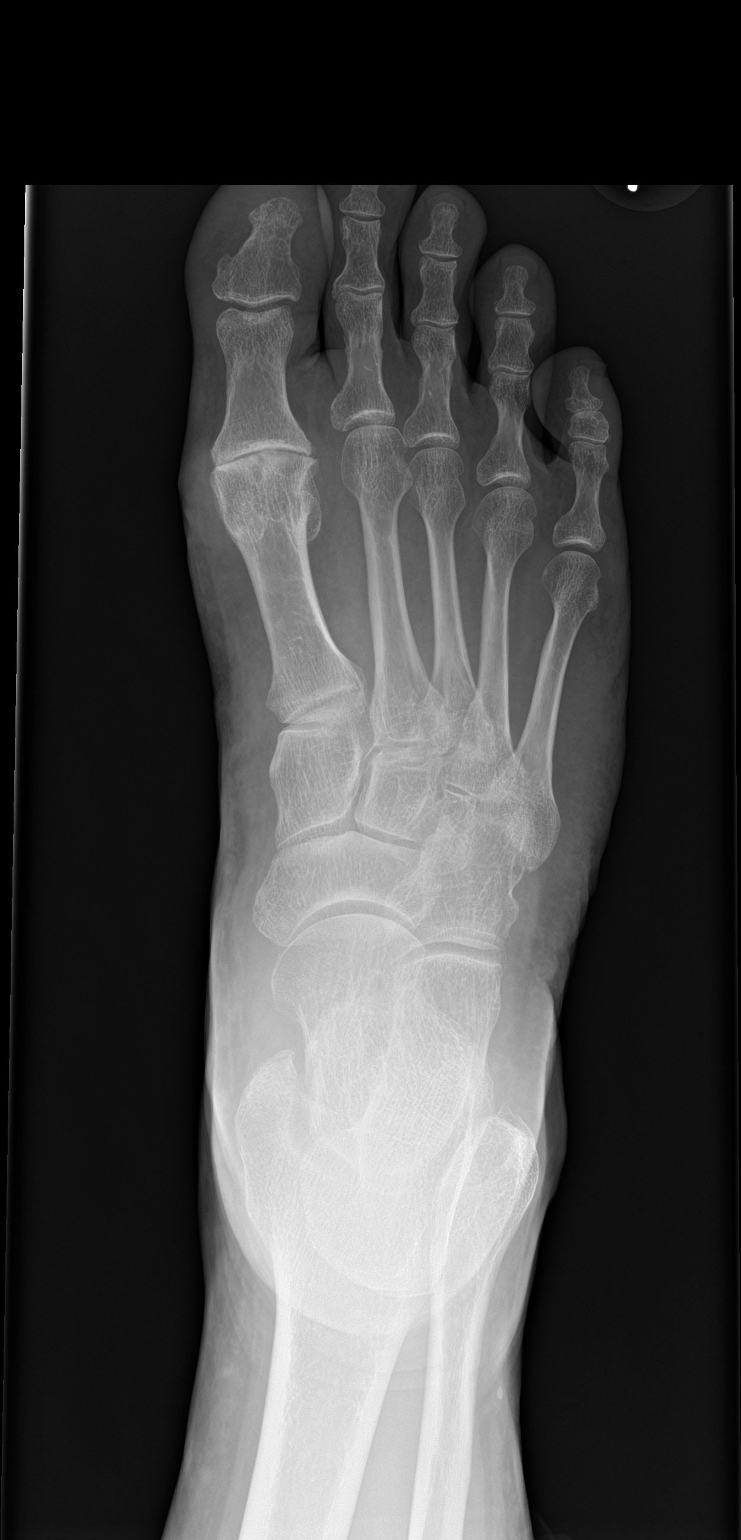

[foot obl]
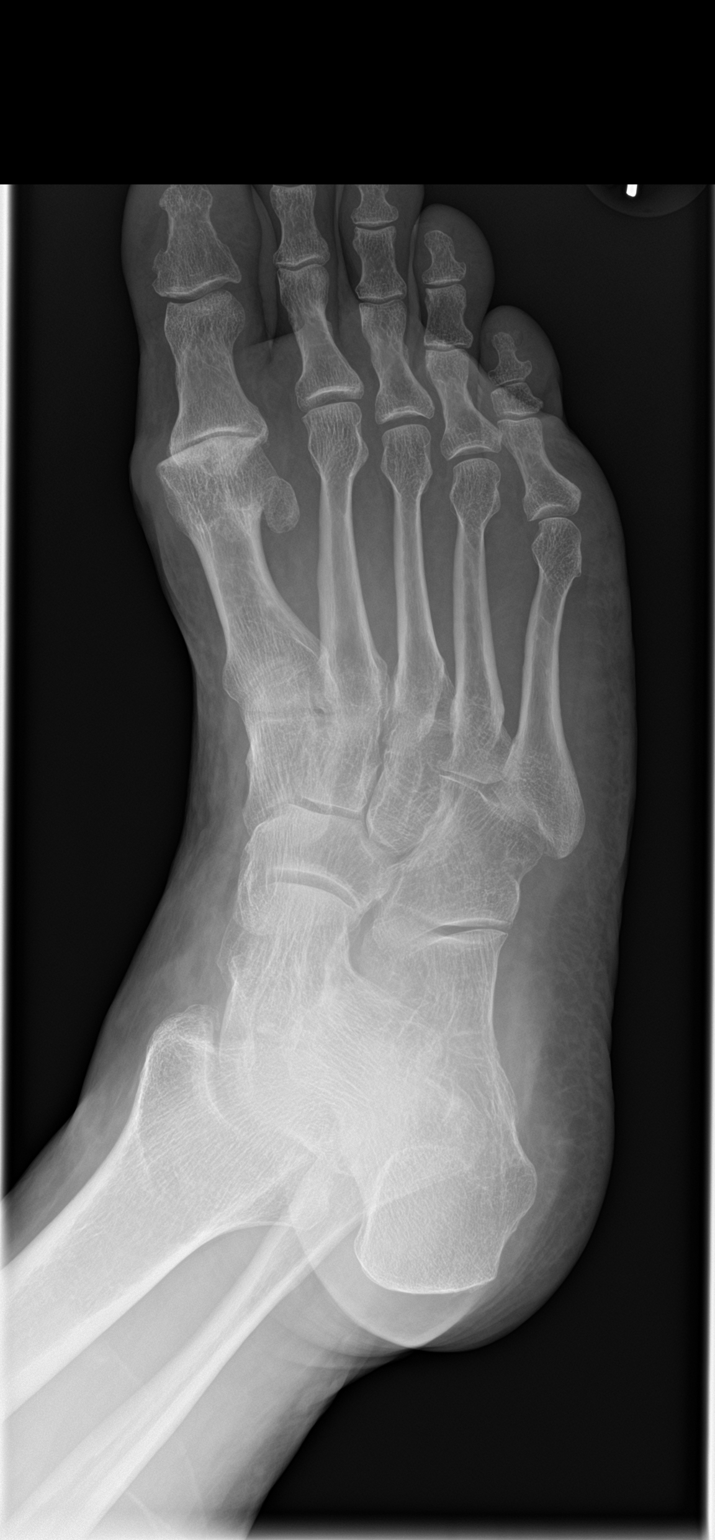

[foot lat]
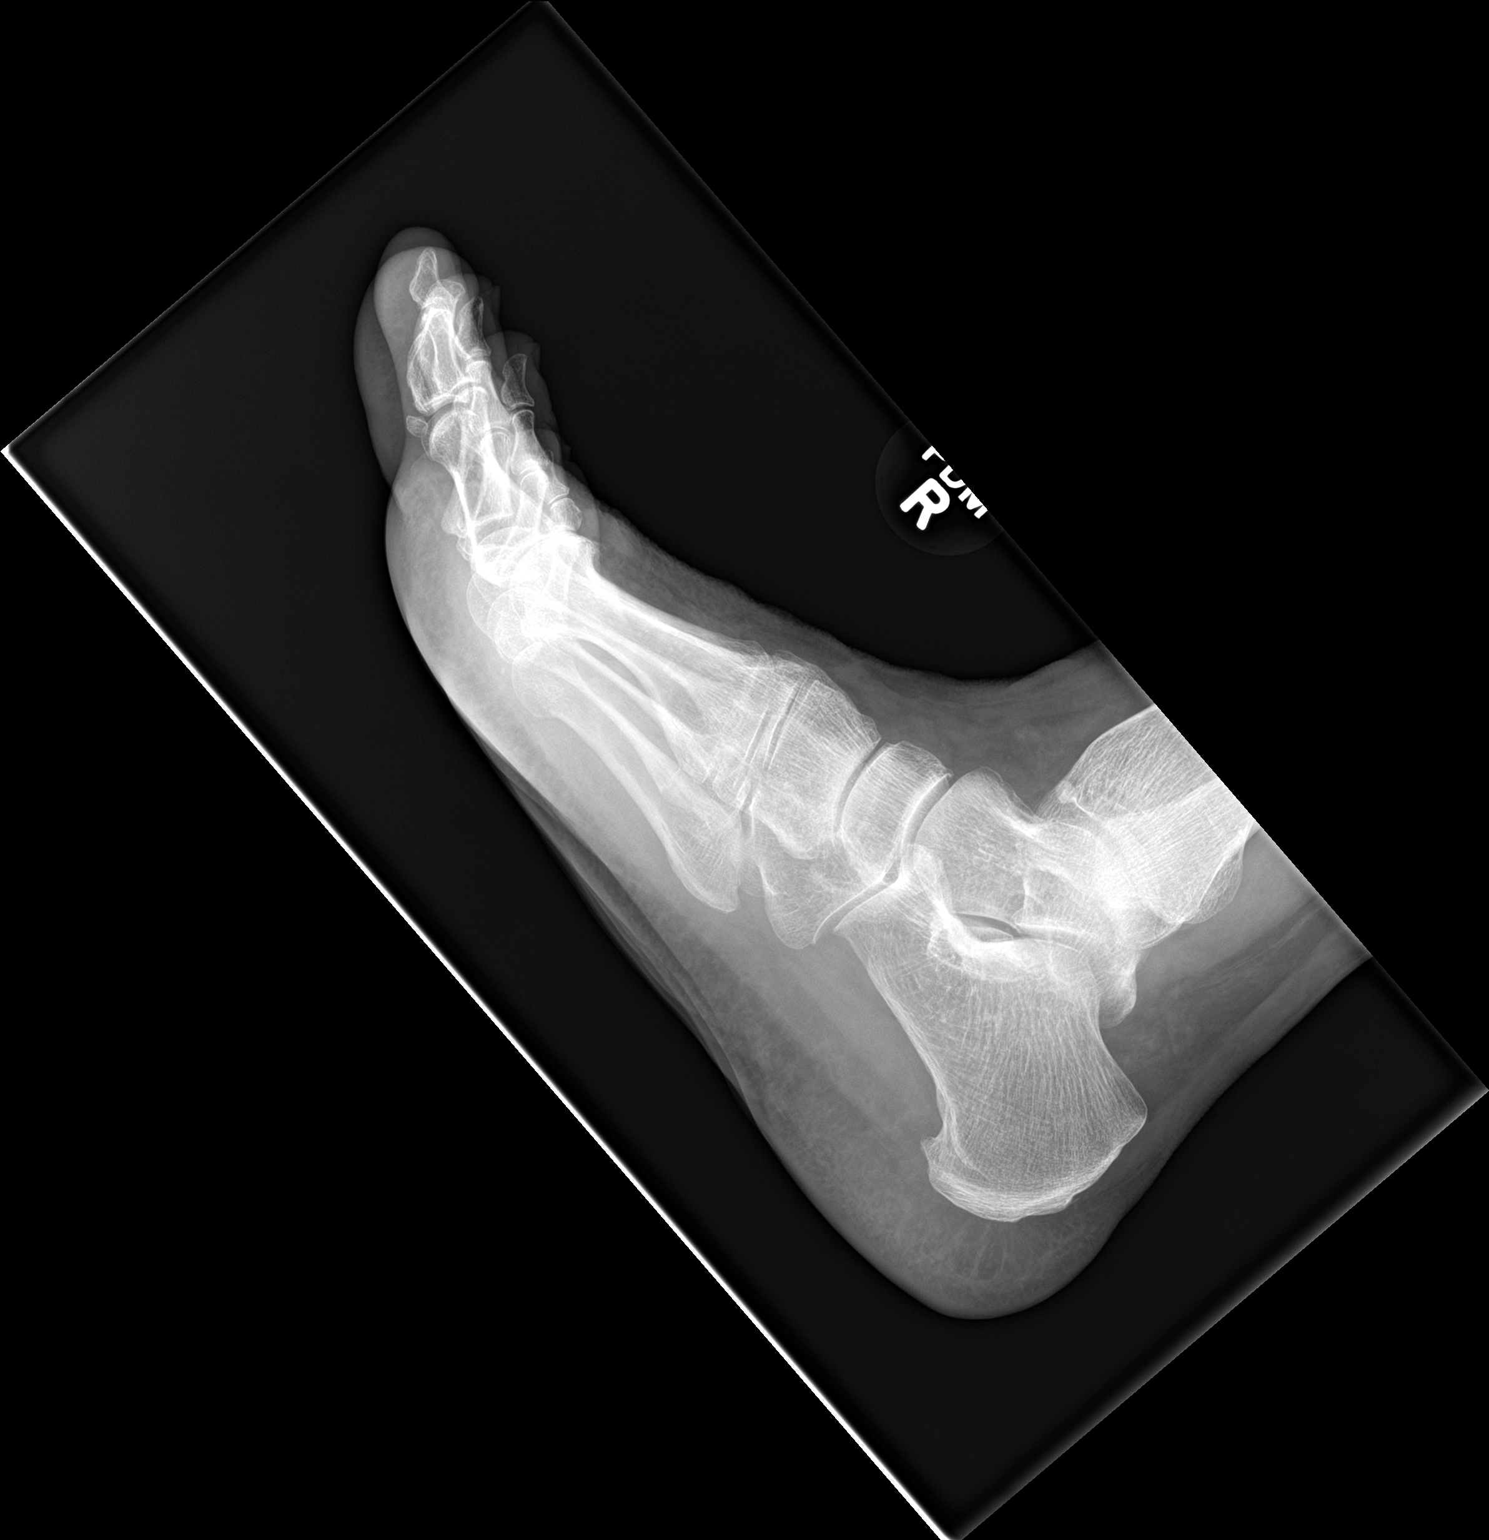

[3 of 3 positions shown; findings below may reference images not displayed]

FINDINGS: Advanced degenerative change in the first metatarsal phalangeal
joint with joint space narrowing and spurring. No erosion. Negative
for fracture. No other arthropathy.
IMPRESSION: Advanced degenerative change in the first metatarsal phalangeal
joint.

## 2019-04-19 DIAGNOSIS — H5202 Hypermetropia, left eye: Secondary | ICD-10-CM | POA: Diagnosis not present

## 2019-04-19 DIAGNOSIS — Z961 Presence of intraocular lens: Secondary | ICD-10-CM | POA: Diagnosis not present

## 2019-04-19 DIAGNOSIS — Z9889 Other specified postprocedural states: Secondary | ICD-10-CM | POA: Diagnosis not present

## 2019-04-19 DIAGNOSIS — H43393 Other vitreous opacities, bilateral: Secondary | ICD-10-CM | POA: Diagnosis not present

## 2019-04-19 DIAGNOSIS — H524 Presbyopia: Secondary | ICD-10-CM | POA: Diagnosis not present

## 2019-05-14 DIAGNOSIS — G20 Parkinson's disease: Secondary | ICD-10-CM | POA: Diagnosis not present

## 2019-06-20 ENCOUNTER — Ambulatory Visit (INDEPENDENT_AMBULATORY_CARE_PROVIDER_SITE_OTHER): Payer: Medicare Other | Admitting: Internal Medicine

## 2019-06-20 ENCOUNTER — Encounter: Payer: Self-pay | Admitting: Internal Medicine

## 2019-06-20 ENCOUNTER — Other Ambulatory Visit: Payer: Self-pay

## 2019-06-20 VITALS — BP 136/76 | HR 54 | Temp 98.3°F | Ht 70.0 in | Wt 211.0 lb

## 2019-06-20 DIAGNOSIS — N320 Bladder-neck obstruction: Secondary | ICD-10-CM | POA: Diagnosis not present

## 2019-06-20 DIAGNOSIS — E034 Atrophy of thyroid (acquired): Secondary | ICD-10-CM | POA: Diagnosis not present

## 2019-06-20 DIAGNOSIS — I10 Essential (primary) hypertension: Secondary | ICD-10-CM

## 2019-06-20 DIAGNOSIS — N183 Chronic kidney disease, stage 3 unspecified: Secondary | ICD-10-CM | POA: Diagnosis not present

## 2019-06-20 DIAGNOSIS — E538 Deficiency of other specified B group vitamins: Secondary | ICD-10-CM | POA: Diagnosis not present

## 2019-06-20 DIAGNOSIS — R739 Hyperglycemia, unspecified: Secondary | ICD-10-CM

## 2019-06-20 LAB — CBC WITH DIFFERENTIAL/PLATELET
Basophils Absolute: 0 10*3/uL (ref 0.0–0.1)
Basophils Relative: 0.8 % (ref 0.0–3.0)
Eosinophils Absolute: 0.2 10*3/uL (ref 0.0–0.7)
Eosinophils Relative: 4.1 % (ref 0.0–5.0)
HCT: 41.7 % (ref 39.0–52.0)
Hemoglobin: 14.1 g/dL (ref 13.0–17.0)
Lymphocytes Relative: 33.9 % (ref 12.0–46.0)
Lymphs Abs: 1.6 10*3/uL (ref 0.7–4.0)
MCHC: 33.9 g/dL (ref 30.0–36.0)
MCV: 90 fl (ref 78.0–100.0)
Monocytes Absolute: 0.5 10*3/uL (ref 0.1–1.0)
Monocytes Relative: 10.1 % (ref 3.0–12.0)
Neutro Abs: 2.5 10*3/uL (ref 1.4–7.7)
Neutrophils Relative %: 51.1 % (ref 43.0–77.0)
Platelets: 140 10*3/uL — ABNORMAL LOW (ref 150.0–400.0)
RBC: 4.63 Mil/uL (ref 4.22–5.81)
RDW: 13.7 % (ref 11.5–15.5)
WBC: 4.8 10*3/uL (ref 4.0–10.5)

## 2019-06-20 LAB — LIPID PANEL
Cholesterol: 143 mg/dL (ref 0–200)
HDL: 44.3 mg/dL (ref >39.00)
LDL Cholesterol: 73 mg/dL (ref 0–99)
NonHDL: 98.22
Total CHOL/HDL Ratio: 3
Triglycerides: 128 mg/dL (ref 0.0–149.0)
VLDL: 25.6 mg/dL (ref 0.0–40.0)

## 2019-06-20 LAB — URINALYSIS
Bilirubin Urine: NEGATIVE
Hgb urine dipstick: NEGATIVE
Ketones, ur: NEGATIVE
Leukocytes,Ua: NEGATIVE
Nitrite: NEGATIVE
Specific Gravity, Urine: 1.02 (ref 1.000–1.030)
Total Protein, Urine: NEGATIVE
Urine Glucose: NEGATIVE
Urobilinogen, UA: 0.2 (ref 0.0–1.0)
pH: 6.5 (ref 5.0–8.0)

## 2019-06-20 LAB — BASIC METABOLIC PANEL
BUN: 26 mg/dL — ABNORMAL HIGH (ref 6–23)
CO2: 26 mEq/L (ref 19–32)
Calcium: 9.3 mg/dL (ref 8.4–10.5)
Chloride: 103 mEq/L (ref 96–112)
Creatinine, Ser: 1.46 mg/dL (ref 0.40–1.50)
GFR: 47.95 mL/min — ABNORMAL LOW (ref >60.00)
Glucose, Bld: 105 mg/dL — ABNORMAL HIGH (ref 70–99)
Potassium: 3.9 mEq/L (ref 3.5–5.1)
Sodium: 137 mEq/L (ref 135–145)

## 2019-06-20 LAB — HEPATIC FUNCTION PANEL
ALT: 8 U/L (ref 0–53)
AST: 15 U/L (ref 0–37)
Albumin: 4.3 g/dL (ref 3.5–5.2)
Alkaline Phosphatase: 53 U/L (ref 39–117)
Bilirubin, Direct: 0.1 mg/dL (ref 0.0–0.3)
Total Bilirubin: 0.5 mg/dL (ref 0.2–1.2)
Total Protein: 6.5 g/dL (ref 6.0–8.3)

## 2019-06-20 LAB — TSH: TSH: 3.21 u[IU]/mL (ref 0.35–4.50)

## 2019-06-20 LAB — PSA: PSA: 1.14 ng/mL (ref 0.10–4.00)

## 2019-06-20 LAB — HEMOGLOBIN A1C: Hgb A1c MFr Bld: 5.6 % (ref 4.6–6.5)

## 2019-06-20 LAB — VITAMIN B12: Vitamin B-12: 874 pg/mL (ref 211–911)

## 2019-06-20 MED ORDER — TRAZODONE HCL 50 MG PO TABS: ORAL_TABLET | ORAL | 3 refills | Status: DC

## 2019-06-20 MED ORDER — LOSARTAN POTASSIUM 100 MG PO TABS: ORAL_TABLET | ORAL | 3 refills | Status: DC

## 2019-06-20 MED ORDER — LEVOTHYROXINE SODIUM 75 MCG PO TABS: 75.0000 ug | ORAL_TABLET | Freq: Every day | ORAL | 3 refills | Status: DC

## 2019-06-20 NOTE — Assessment & Plan Note (Signed)
Losartan 

## 2019-06-20 NOTE — Assessment & Plan Note (Signed)
Labs

## 2019-06-20 NOTE — Assessment & Plan Note (Signed)
Appt w/Dr Justin Mend - pending

## 2019-06-20 NOTE — Progress Notes (Signed)
Subjective:  Patient ID: Adrian [name], male    DOB: 1950/10/24  Age: 69 y.o. MRN: [id number]  CC: No chief complaint on file.   HPI Adrian [name] presents for hypothyroidism, HTN, Parkinson's anxiety f/u  Outpatient Medications Prior to Visit  Medication Sig Dispense Refill  . carbidopa-levodopa (SINEMET IR) 25-100 MG tablet Take 1 tablet by mouth 3 (three) times daily. (Patient taking differently: Take 1 tablet by mouth 4 (four) times daily. ) 270 tablet 1  . Cetirizine HCl (ZYRTEC ALLERGY) 10 MG CAPS     . diphenhydrAMINE HCl (BENADRYL ALLERGY PO)     . fluticasone (FLONASE) 50 MCG/ACT nasal spray Place into both nostrils daily.    . halobetasol (ULTRAVATE) 0.05 % cream APPLY CREAM TOPICALLY 2 TIMES DAILY AS NEEDED 45 g 0  . levothyroxine (SYNTHROID, LEVOTHROID) 75 MCG tablet Take 1 tablet (75 mcg total) by mouth daily. 90 tablet 3  . losartan (COZAAR) 100 MG tablet Take 1/2 (one-half) tablet by mouth once daily 45 tablet 3  . Misc Natural Products (TART CHERRY ADVANCED) CAPS Take by mouth daily.    . Multiple Vitamins-Minerals (CENTRUM SILVER ADULT 50+ PO) Take 1 tablet by mouth at bedtime.    . Probiotic Product (PROBIOTIC DAILY PO) Take 1 tablet by mouth at bedtime.    . sertraline (ZOLOFT) 25 MG tablet Take 25 mg by mouth daily.    . traZODone (DESYREL) 50 MG tablet TAKE 1/2 TO 1 (ONE-HALF TO ONE) TABLET BY MOUTH AT BEDTIME AS NEEDED FOR SLEEP 90 tablet 3  . TURMERIC PO Take by mouth daily.    Marland Kitchen VALERIAN ROOT PO Take 1,500 mg by mouth at bedtime.    . Vitamin D, Ergocalciferol, (DRISDOL) 1.25 MG (50000 UT) CAPS capsule TAKE 1 CAPSULE BY MOUTH ONCE A WEEK 12 capsule 0  . Zinc 50 MG TABS Take 1 tablet by mouth every morning.      . fexofenadine (ALLEGRA) 180 MG tablet Take 180 mg by mouth daily.    Marland Kitchen gabapentin (NEURONTIN) 300 MG capsule 1 pill nightly 90 capsule 3  . neomycin-polymyxin-hydrocortisone (CORTISPORIN) OTIC solution Apply 1 to 2 drops to toe BID after soaking 10 mL  0   No facility-administered medications prior to visit.    ROS: Review of Systems  Constitutional: Negative for appetite change, fatigue and unexpected weight change.  HENT: Negative for congestion, nosebleeds, sneezing, sore throat and trouble swallowing.   Eyes: Negative for itching and visual disturbance.  Respiratory: Negative for cough.   Cardiovascular: Negative for chest pain, palpitations and leg swelling.  Gastrointestinal: Negative for abdominal distention, blood in stool, diarrhea and nausea.  Genitourinary: Negative for frequency and hematuria.  Musculoskeletal: Negative for back pain, gait problem, joint swelling and neck pain.  Skin: Negative for rash.  Neurological: Positive for tremors. Negative for dizziness, speech difficulty and weakness.  Psychiatric/Behavioral: Negative for agitation, dysphoric mood, sleep disturbance and suicidal ideas. The patient is not nervous/anxious.     Objective:  BP 136/76 (BP Location: Left Arm, Patient Position: Sitting, Cuff Size: Large)   Pulse (!) 54   Temp 98.3 F (36.8 C) (Oral)   Ht 5\' 10"  (1.778 m)   Wt 211 lb (95.7 kg)   SpO2 95%   BMI 30.28 kg/m   BP Readings from Last 3 Encounters:  06/20/19 136/76  03/06/19 (!) 142/80  12/18/18 136/84    Wt Readings from Last 3 Encounters:  06/20/19 211 lb (95.7 kg)  03/06/19 207 lb 14.4  oz (94.3 kg)  12/18/18 205 lb (93 kg)    Physical Exam Constitutional:      General: He is not in acute distress.    Appearance: He is well-developed.     Comments: NAD  Eyes:     Conjunctiva/sclera: Conjunctivae normal.     Pupils: Pupils are equal, round, and reactive to light.  Neck:     Thyroid: No thyromegaly.     Vascular: No JVD.  Cardiovascular:     Rate and Rhythm: Normal rate and regular rhythm.     Heart sounds: Normal heart sounds. No murmur. No friction rub. No gallop.   Pulmonary:     Effort: Pulmonary effort is normal. No respiratory distress.     Breath sounds:  Normal breath sounds. No wheezing or rales.  Chest:     Chest wall: No tenderness.  Abdominal:     General: Bowel sounds are normal. There is no distension.     Palpations: Abdomen is soft. There is no mass.     Tenderness: There is no abdominal tenderness. There is no guarding or rebound.  Musculoskeletal:        General: No tenderness. Normal range of motion.     Cervical back: Normal range of motion.  Lymphadenopathy:     Cervical: No cervical adenopathy.  Skin:    General: Skin is warm and dry.     Findings: No rash.  Neurological:     Mental Status: He is alert and oriented to person, place, and time.     Cranial Nerves: No cranial nerve deficit.     Motor: No abnormal muscle tone.     Coordination: Coordination normal.     Gait: Gait normal.     Deep Tendon Reflexes: Reflexes are normal and symmetric.  Psychiatric:        Behavior: Behavior normal.        Thought Content: Thought content normal.        Judgment: Judgment normal.   Pt declined rectal  Lab Results  Component Value Date   WBC 4.8 06/21/2018   HGB 15.1 06/21/2018   HCT 44.0 06/21/2018   PLT 147.0 (L) 06/21/2018   GLUCOSE 104 (H) 12/18/2018   CHOL 139 06/21/2018   TRIG 109.0 06/21/2018   HDL 39.20 06/21/2018   LDLDIRECT 66.0 05/10/2016   LDLCALC 78 06/21/2018   ALT 15 12/18/2018   AST 14 12/18/2018   NA 140 12/18/2018   K 4.2 12/18/2018   CL 105 12/18/2018   CREATININE 1.48 12/18/2018   BUN 18 12/18/2018   CO2 27 12/18/2018   TSH 3.53 12/18/2018   PSA 0.69 06/21/2018   INR 1.0 RATIO 09/18/2007   HGBA1C 5.8 12/18/2018    US Renal  Result Date: 01/04/2019 CLINICAL DATA:  Chronic renal disease. EXAM: RENAL / URINARY TRACT ULTRASOUND COMPLETE COMPARISON:  None. FINDINGS: Right Kidney: Renal measurements: 8.4 x 3.3 x 3.0 cm = volume: 44 mL. Diffuse atrophy/cortical thinning. The cortex measures 5 mm. Left Kidney: Renal measurements: 13.3 x 5.6 x 6.6 cm = volume: 254 mL. Mild hydronephrosis. Normal  cortical thickness. Bladder: Appears normal for degree of bladder distention. IMPRESSION: 1. Atrophy and cortical thinning on the right. 2. Mild age-indeterminate hydronephrosis on the left. Recommend clinical correlation. Electronically Signed   By: Dorise Bullion III M.D   On: 01/04/2019 16:57    Assessment & Plan:   There are no diagnoses linked to this encounter.   No orders of the defined types  were placed in this encounter.    Follow-up: No follow-ups on file.  Walker Kehr, MD

## 2019-06-20 NOTE — Assessment & Plan Note (Signed)
On B12 

## 2019-06-26 ENCOUNTER — Ambulatory Visit: Payer: Medicare Other

## 2019-07-03 ENCOUNTER — Other Ambulatory Visit: Payer: Self-pay

## 2019-07-03 DIAGNOSIS — I10 Essential (primary) hypertension: Secondary | ICD-10-CM

## 2019-07-05 ENCOUNTER — Ambulatory Visit: Payer: Medicare Other | Admitting: Neurology

## 2019-07-05 ENCOUNTER — Ambulatory Visit: Payer: Medicare Other | Attending: Internal Medicine

## 2019-07-05 DIAGNOSIS — Z23 Encounter for immunization: Secondary | ICD-10-CM

## 2019-07-05 NOTE — Progress Notes (Signed)
   Covid-19 Vaccination Clinic  Name:  Adrian [name]    MRN: [id number] DOB: [birth year]  07/05/2019  Mr. Adrian [name] was observed post Covid-19 immunization for 15 minutes without incidence. He was provided with Vaccine Information Sheet and instruction to access the V-Safe system.   Mr. Adrian [name] was instructed to call 911 with any severe reactions post vaccine: Marland Kitchen Difficulty breathing  . Swelling of your face and throat  . A fast heartbeat  . A bad rash all over your body  . Dizziness and weakness    Immunizations Administered    Name Date Dose VIS Date Route   Pfizer COVID-19 Vaccine 07/05/2019  4:01 PM 0.3 mL 05/11/2019 Intramuscular   Manufacturer: [place]   Lot: [id number]   [place]: [id number]

## 2019-07-06 DIAGNOSIS — N1831 Chronic kidney disease, stage 3a: Secondary | ICD-10-CM | POA: Diagnosis not present

## 2019-07-06 DIAGNOSIS — N1832 Chronic kidney disease, stage 3b: Secondary | ICD-10-CM | POA: Diagnosis not present

## 2019-07-06 DIAGNOSIS — N261 Atrophy of kidney (terminal): Secondary | ICD-10-CM | POA: Diagnosis not present

## 2019-07-06 DIAGNOSIS — I129 Hypertensive chronic kidney disease with stage 1 through stage 4 chronic kidney disease, or unspecified chronic kidney disease: Secondary | ICD-10-CM | POA: Diagnosis not present

## 2019-07-06 DIAGNOSIS — N1330 Unspecified hydronephrosis: Secondary | ICD-10-CM | POA: Diagnosis not present

## 2019-07-06 DIAGNOSIS — E785 Hyperlipidemia, unspecified: Secondary | ICD-10-CM | POA: Diagnosis not present

## 2019-07-07 ENCOUNTER — Other Ambulatory Visit: Payer: Self-pay | Admitting: Internal Medicine

## 2019-07-07 DIAGNOSIS — E785 Hyperlipidemia, unspecified: Secondary | ICD-10-CM

## 2019-07-07 DIAGNOSIS — R739 Hyperglycemia, unspecified: Secondary | ICD-10-CM

## 2019-07-10 ENCOUNTER — Other Ambulatory Visit: Payer: Self-pay | Admitting: Internal Medicine

## 2019-07-10 ENCOUNTER — Ambulatory Visit (INDEPENDENT_AMBULATORY_CARE_PROVIDER_SITE_OTHER)
Admission: RE | Admit: 2019-07-10 | Discharge: 2019-07-10 | Disposition: A | Discharge status: Home / Self Care | Payer: Self-pay | Source: Ambulatory Visit | Attending: Internal Medicine | Admitting: Internal Medicine

## 2019-07-10 ENCOUNTER — Other Ambulatory Visit: Payer: Self-pay

## 2019-07-10 DIAGNOSIS — I7121 Aneurysm of the ascending aorta, without rupture: Secondary | ICD-10-CM | POA: Insufficient documentation

## 2019-07-10 DIAGNOSIS — I2510 Atherosclerotic heart disease of native coronary artery without angina pectoris: Secondary | ICD-10-CM | POA: Insufficient documentation

## 2019-07-10 DIAGNOSIS — I2583 Coronary atherosclerosis due to lipid rich plaque: Secondary | ICD-10-CM

## 2019-07-10 DIAGNOSIS — I712 Thoracic aortic aneurysm, without rupture: Secondary | ICD-10-CM | POA: Insufficient documentation

## 2019-07-10 DIAGNOSIS — I10 Essential (primary) hypertension: Secondary | ICD-10-CM

## 2019-07-10 DIAGNOSIS — Z0000 Encounter for general adult medical examination without abnormal findings: Secondary | ICD-10-CM

## 2019-07-10 MED ORDER — ASPIRIN EC 81 MG PO TBEC: 81.0000 mg | DELAYED_RELEASE_TABLET | Freq: Every day | ORAL | 3 refills | Status: AC

## 2019-07-14 ENCOUNTER — Other Ambulatory Visit: Payer: Self-pay | Admitting: Internal Medicine

## 2019-07-14 DIAGNOSIS — I2510 Atherosclerotic heart disease of native coronary artery without angina pectoris: Secondary | ICD-10-CM

## 2019-07-14 MED ORDER — ROSUVASTATIN CALCIUM 10 MG PO TABS: 10.0000 mg | ORAL_TABLET | Freq: Every day | ORAL | 11 refills | Status: DC

## 2019-07-17 ENCOUNTER — Other Ambulatory Visit: Payer: Self-pay | Admitting: Nephrology

## 2019-07-17 DIAGNOSIS — N1330 Unspecified hydronephrosis: Secondary | ICD-10-CM

## 2019-07-17 DIAGNOSIS — N1831 Chronic kidney disease, stage 3a: Secondary | ICD-10-CM

## 2019-07-17 DIAGNOSIS — N261 Atrophy of kidney (terminal): Secondary | ICD-10-CM

## 2019-07-19 ENCOUNTER — Other Ambulatory Visit: Payer: Medicare Other

## 2019-07-20 ENCOUNTER — Other Ambulatory Visit: Payer: Medicare Other

## 2019-07-24 ENCOUNTER — Ambulatory Visit
Admission: RE | Admit: 2019-07-24 | Discharge: 2019-07-24 | Disposition: A | Discharge status: Home / Self Care | Payer: Medicare Other | Source: Ambulatory Visit | Attending: Nephrology | Admitting: Nephrology

## 2019-07-24 ENCOUNTER — Ambulatory Visit: Payer: Medicare Other | Admitting: Cardiology

## 2019-07-24 DIAGNOSIS — N1831 Chronic kidney disease, stage 3a: Secondary | ICD-10-CM

## 2019-07-24 DIAGNOSIS — N261 Atrophy of kidney (terminal): Secondary | ICD-10-CM

## 2019-07-24 DIAGNOSIS — N1330 Unspecified hydronephrosis: Secondary | ICD-10-CM

## 2019-07-27 ENCOUNTER — Ambulatory Visit: Payer: Medicare Other | Admitting: Cardiovascular Disease

## 2019-07-27 ENCOUNTER — Other Ambulatory Visit: Payer: Self-pay

## 2019-07-27 ENCOUNTER — Encounter: Payer: Self-pay | Admitting: Cardiovascular Disease

## 2019-07-27 VITALS — BP 140/80 | HR 51 | Ht 70.0 in | Wt 210.0 lb

## 2019-07-27 DIAGNOSIS — I712 Thoracic aortic aneurysm, without rupture: Secondary | ICD-10-CM

## 2019-07-27 DIAGNOSIS — I7121 Aneurysm of the ascending aorta, without rupture: Secondary | ICD-10-CM

## 2019-07-27 DIAGNOSIS — I2583 Coronary atherosclerosis due to lipid rich plaque: Secondary | ICD-10-CM | POA: Diagnosis not present

## 2019-07-27 DIAGNOSIS — I10 Essential (primary) hypertension: Secondary | ICD-10-CM | POA: Diagnosis not present

## 2019-07-27 DIAGNOSIS — N1831 Chronic kidney disease, stage 3a: Secondary | ICD-10-CM

## 2019-07-27 DIAGNOSIS — I2510 Atherosclerotic heart disease of native coronary artery without angina pectoris: Secondary | ICD-10-CM

## 2019-07-27 DIAGNOSIS — E785 Hyperlipidemia, unspecified: Secondary | ICD-10-CM | POA: Diagnosis not present

## 2019-07-27 MED ORDER — LOSARTAN POTASSIUM 100 MG PO TABS: 100.0000 mg | ORAL_TABLET | Freq: Every day | ORAL | 3 refills | Status: DC

## 2019-07-27 NOTE — Patient Instructions (Signed)
Medication Instructions:  No changes *If you need a refill on your cardiac medications before your next appointment, please call your pharmacy*   Lab Work: None ordered If you have labs (blood work) drawn today and your tests are completely normal, you will receive your results only by: Marland Kitchen MyChart Message (if you have MyChart) OR . A paper copy in the mail If you have any lab test that is abnormal or we need to change your treatment, we will call you to review the results.   Testing/Procedures: Your physician has requested that you have an exercise tolerance test in 12 months. For further information please visit [email or web address]. Please also follow instruction sheet, as given. This will take place at [address], [address].  Do not drink or eat foods with caffeine for 24 hours before the test. (Chocolate, coffee, tea, or energy drinks)  If you use an inhaler, bring it with you to the test.  Do not smoke for 4 hours before the test.  Wear comfortable shoes and clothing.  Dr. Sallyanne Kuster has ordered an MR of the Chest to be completed in 12 months. They will call you to set this up.   Follow-Up: At [facility], you and your health needs are our priority.  As part of our continuing mission to provide you with exceptional heart care, we have created designated Provider Care Teams.  These Care Teams include your primary Cardiologist (physician) and Advanced Practice Providers (APPs -  Physician Assistants and Nurse Practitioners) who all work together to provide you with the care you need, when you need it.  We recommend signing up for the patient portal called "MyChart".  Sign up information is provided on this After Visit Summary.  MyChart is used to connect with patients for Virtual Visits (Telemedicine).  Patients are able to view lab/test results, encounter notes, upcoming appointments, etc.  Non-urgent messages can be sent to your provider as well.   To learn more about what  you can do with MyChart, go to [email or web address].    Your next appointment:   12 month(s)  The format for your next appointment:   In Person  Provider:   Sanda Klein, MD

## 2019-07-27 NOTE — Progress Notes (Signed)
Cardiology Consultation Note:    Date:  07/28/2019   ID:  Adrian [name], DOB 01/11/1951, MRN [id number]  PCP:  Cassandria Anger, MD  Cardiologist:  No primary care provider on file.  Electrophysiologist:  None   Referring MD: Cassandria Anger, MD   Chief Complaint  Patient presents with  . Thoracic Aortic Aneurysm  . Coronary Artery Disease  Adrian [name] is a 69 y.o. male who is being seen today for the evaluation of high coronary calcium score and ascending aortic dilation at the request of Plotnikov, Evie Lacks, MD.   History of Present Illness:    Adrian [name] is a 69 y.o. male with a hx of hypercholesterolemia hypertension, family history of coronary artery disease, moderate chronic kidney disease, recently diagnosed Parkinson's disease, referred after a low-dose chest CT showed an elevated calcium score of 630 (80th percentile) and a mildly dilated ascending aorta at 4.2 cm.  Adrian [name] has no symptoms of coronary disease.  Every day he either rides his bike for 20 minutes or goes on a 2 mile walk.  He splits firewood with an axe and carries that went into his home, without problems with shortness of breath or chest discomfort.  She does describe occasional palpitations are consistently associated with anxiety.  He does not have leg edema and denies intermittent claudication.  He has resting tremor a little worse in his left hand compared to the right that has been become steadily more noticeable over the last couple of years.  He does not have other focal neurological events and does not have a history of stroke or TIA.  Imaging studies have showed that he has an atrophied right kidney but no evidence of right renal artery stenosis.  He has mild left hydronephrosis.  His creatinine is 1.46.  On statin therapy his LDL cholesterol 73.  He does not have diabetes mellitus.  His blood pressure is borderline controlled.  Today it was 140/80 and when he last saw Dr. Alain Marion it was  136/76.  He is borderline obese with a BMI right at 30.  He will receive his second [place] shot next week.  Past Medical History:  Diagnosis Date  . Allergic rhinitis   . Cancer ([place]) 2016   basal cell skin cancer nose  . Cataract   . GERD (gastroesophageal reflux disease)   . Hyperlipidemia   . Hypertension   . Hypothyroidism 2010   mild  . Hypothyroidism   . Trigeminal neuralgia    L.  . Vitamin B12 deficiency 2010    Past Surgical History:  Procedure Laterality Date  . ARTHRODESIS METATARSALPHALANGEAL JOINT (MTPJ) Right 07/27/2018   Procedure: Right hallux metatarsal phalangeal joint arthrodesis;  Surgeon: Wylene Simmer, MD;  Location: [place];  Service: Orthopedics;  Laterality: Right;  . arthroscopic surgery for chondromalacia Left 1995  . CATARACT EXTRACTION W/ INTRAOCULAR LENS  IMPLANT, BILATERAL  2017  . COLONOSCOPY    . gamma knife surgery for trigeminal neuralgia  2005  . inguinal herniorrhahpy  1991   L.  Marland Kitchen POLYPECTOMY    . TONSILLECTOMY  1959  . TRIGEMINAL NERVE DECOMPRESSION  2008   Left  Dr. Claybon Jabs    Current Medications: Current Meds  Medication Sig  . aspirin EC 81 MG tablet Take 1 tablet (81 mg total) by mouth daily.  . carbidopa-levodopa (SINEMET IR) 25-100 MG tablet Take 1 tablet by mouth 3 (three) times daily. (Patient taking differently: Take 1 tablet by  mouth 4 (four) times daily. )  . Cetirizine HCl (ZYRTEC ALLERGY) 10 MG CAPS   . diphenhydrAMINE HCl (BENADRYL ALLERGY PO)   . fluticasone (FLONASE) 50 MCG/ACT nasal spray Place into both nostrils daily.  . halobetasol (ULTRAVATE) 0.05 % cream APPLY CREAM TOPICALLY 2 TIMES DAILY AS NEEDED  . levothyroxine (SYNTHROID) 75 MCG tablet Take 1 tablet (75 mcg total) by mouth daily.  Marland Kitchen losartan (COZAAR) 100 MG tablet Take 1 tablet (100 mg total) by mouth daily.  . Misc Natural Products (TART CHERRY ADVANCED) CAPS Take by mouth daily.  . Multiple Vitamins-Minerals (CENTRUM SILVER  ADULT 50+ PO) Take 1 tablet by mouth at bedtime.  . Probiotic Product (PROBIOTIC DAILY PO) Take 1 tablet by mouth at bedtime.  . rosuvastatin (CRESTOR) 10 MG tablet Take 1 tablet (10 mg total) by mouth daily.  . sertraline (ZOLOFT) 25 MG tablet Take 25 mg by mouth daily.  . traZODone (DESYREL) 50 MG tablet TAKE 1/2 TO 1 (ONE-HALF TO ONE) TABLET BY MOUTH AT BEDTIME AS NEEDED FOR SLEEP  . TURMERIC PO Take by mouth daily.  Marland Kitchen VALERIAN ROOT PO Take 1,500 mg by mouth at bedtime.  . Vitamin D, Ergocalciferol, (DRISDOL) 1.25 MG (50000 UT) CAPS capsule TAKE 1 CAPSULE BY MOUTH ONCE A WEEK  . Zinc 50 MG TABS Take 1 tablet by mouth every morning.    . [DISCONTINUED] losartan (COZAAR) 100 MG tablet Take 1/2 (one-half) tablet by mouth once daily (Patient taking differently: Take 100 mg by mouth daily. )     Allergies:   Tape, Elavil [amitriptyline hcl], and Triple antibiotic [bacitracin-neomycin-polymyxin]   Social History   Socioeconomic History  . Marital status: Married    Spouse name: Not on file  . Number of children: 1  . Years of education: Not on file  . Highest education level: Bachelor's degree (e.g., BA, AB, BS)  Occupational History  . 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 Smoking status: Former Smoker    Quit date: 05/31/1992    Years since quitting: 27.1  . Smokeless tobacco: Never Used  Substance and Sexual Activity  . Alcohol use: Yes    Alcohol/week: 7.0 standard drinks    Types: 7 Glasses of wine per week    Comment: at least one drink per night; sometimes "we split a bottle of wine"  . Drug use: No  . Sexual activity: Yes  Other Topics Concern  . Not on file  Social History Narrative   [employer]   Married x2: 12 years 24st; 15 years ('24) 2nd   One daughter, step daugfhter, step son   Laverle Hobby to bike   Social Determinants of Health   Financial Resource Strain:   . Difficulty of Paying Living Expenses: Not on file  Food Insecurity:    . Worried About Charity fundraiser in the Last Year: Not on file  . Ran Out of Food in the Last Year: Not on file  Transportation Needs:   . Lack of Transportation (Medical): Not on file  . Lack of Transportation (Non-Medical): Not on file  Physical Activity:   . Days of Exercise per Week: Not on file  . Minutes of Exercise per Session: Not on file  Stress:   . Feeling of Stress : Not on file  Social Connections:   . Frequency of Communication with Friends and Family: Not on file  . Frequency of Social Gatherings with Friends and Family: Not on  file  . Attends Religious Services: Not on file  . Active Member of Clubs or Organizations: Not on file  . Attends Archivist Meetings: Not on file  . Marital Status: Not on file     Family History: The patient's family history includes Breast cancer (age of onset: 74) in his sister; Diabetes in his mother; Heart disease (age of onset: 22) in his mother; Prostate cancer (age of onset: 21) in his father; Stroke in his brother. There is no history of Colon cancer, Esophageal cancer, Rectal cancer, or Stomach cancer.  ROS:   Please see the history of present illness.     All other systems reviewed and are negative.  EKGs/Labs/Other Studies Reviewed:    The following studies were reviewed today: CT calcium score 07/10/2019 Ascending Aorta: Normal size, measuring 42 mm at the mid ascending aorta, measured double oblique at PA bifurcation. No significant calcification. Pericardium: Normal Coronary arteries: Arise from normal coronary cusps.  IMPRESSION: Coronary calcium score of 631. This was 80th percentile for age and sex matched control.  Renal artery duplex ultrasound 07/24/2019 1. Chronically atrophied right kidney without evidence of hemodynamically significant renal artery stenosis or occlusion. 2. Compensatory hypertrophy of the left kidney suggests that the right-sided renal atrophy is a chronic abnormality. 3. Mild  left-sided hydronephrosis of uncertain etiology. 4. Mild prostatomegaly. 5. No evidence of nephrolithiasis or renal mass.  EKG:  EKG is  ordered today.  The ekg ordered today demonstrates sinus bradycardia at 51 bpm.  Borderline voltage criteria for LVH without repolarization changes.  Normal QT 442 ms  Recent Labs: 06/20/2019: ALT 8; BUN 26; Creatinine, Ser 1.46; Hemoglobin 14.1; Platelets 140.0; Potassium 3.9; Sodium 137; TSH 3.21  Recent Lipid Panel    Component Value Date/Time   CHOL 143 06/20/2019 0939   TRIG 128.0 06/20/2019 0939   HDL 44.30 06/20/2019 0939   CHOLHDL 3 06/20/2019 0939   VLDL 25.6 06/20/2019 0939   LDLCALC 73 06/20/2019 0939   LDLDIRECT 66.0 05/10/2016 0820    Physical Exam:    VS:  BP 140/80   Pulse (!) 51   Ht 5\' 10"  (1.778 m)   Wt 210 lb (95.3 kg)   SpO2 97%   BMI 30.13 kg/m     Wt Readings from Last 3 Encounters:  07/27/19 210 lb (95.3 kg)  06/20/19 211 lb (95.7 kg)  03/06/19 207 lb 14.4 oz (94.3 kg)     GEN: Borderline obese, well nourished, well developed in no acute distress HEENT: Normal NECK: No JVD; No carotid bruits LYMPHATICS: No lymphadenopathy CARDIAC: Bradycardic, RRR, no murmurs, rubs, gallops RESPIRATORY:  Clear to auscultation without rales, wheezing or rhonchi  ABDOMEN: Soft, non-tender, non-distended MUSCULOSKELETAL:  No edema; No deformity  SKIN: Warm and dry NEUROLOGIC:  Alert and oriented x 3 PSYCHIATRIC:  Normal affect   ASSESSMENT:    1. Coronary artery disease due to lipid rich plaque   2. Ascending aortic aneurysm ([place])   3. Essential hypertension   4. Stage 3a chronic kidney disease   5. Dyslipidemia (high LDL; low HDL)    PLAN:    In order of problems listed above:  1. Coronary calcification: He is completely asymptomatic.  He does have an elevated calcium score but most of his risk factors are well addressed.  His LDL at 73 is very close to target and I would recommend continuing his current statin dose, as  well as the aspirin.  He does not have diabetes mellitus.  It would be beneficial to lose weight.  He is physically very active, but it is reasonable to perform a functional study.  We decided however to delay the exercise treadmill stress test until next year when hopefully the coronavirus restrictions will be more lenient and he will not require Covid screening. 2. Asc Ao Aneur: Very mild dilation and possibly exaggerated since this is a noncontrast study.  Suggest annual follow-up, with a contrast based study in 1 year.  Since he appears to have moderate CKD and one atrophic kidney, recommend MR angiography. 3. HTN: Ideal blood pressure 130/80 or less in view of his kidney.  Increase losartan to 100 mg daily.  His nephrologist is Dr. Marval Regal.  His urologist is Dr. Diona Fanti 4. CKD 3: Seems to have a GFR of around 45-50 since 2016. 5. HLP: His HDL has improved in the last couple of years and his LDL was very close to target.  Continue statin.  Additional weight loss would move all parameters further towards target.     Medication Adjustments/Labs and Tests Ordered: Current medicines are reviewed at length with the patient today.  Concerns regarding medicines are outlined above.  Orders Placed This Encounter  Procedures  . MR Angiogram Chest W Contrast  . Basic metabolic panel  . EXERCISE TOLERANCE TEST (ETT)  . EKG 12-Lead   Meds ordered this encounter  Medications  . losartan (COZAAR) 100 MG tablet    Sig: Take 1 tablet (100 mg total) by mouth daily.    Dispense:  90 tablet    Refill:  3    Patient Instructions  Medication Instructions:  No changes *If you need a refill on your cardiac medications before your next appointment, please call your pharmacy*   Lab Work: None ordered If you have labs (blood work) drawn today and your tests are completely normal, you will receive your results only by: Marland Kitchen MyChart Message (if you have MyChart) OR . A paper copy in the mail If you have  any lab test that is abnormal or we need to change your treatment, we will call you to review the results.   Testing/Procedures: Your physician has requested that you have an exercise tolerance test in 12 months. For further information please visit [email or web address]. Please also follow instruction sheet, as given. This will take place at [place], [address].  Do not drink or eat foods with caffeine for 24 hours before the test. (Chocolate, coffee, tea, or energy drinks)  If you use an inhaler, bring it with you to the test.  Do not smoke for 4 hours before the test.  Wear comfortable shoes and clothing.  Dr. Sallyanne Kuster has ordered an MR of the Chest to be completed in 12 months. They will call you to set this up.   Follow-Up: At [facility], you and your health needs are our priority.  As part of our continuing mission to provide you with exceptional heart care, we have created designated Provider Care Teams.  These Care Teams include your primary Cardiologist (physician) and Advanced Practice Providers (APPs -  Physician Assistants and Nurse Practitioners) who all work together to provide you with the care you need, when you need it.  We recommend signing up for the patient portal called "MyChart".  Sign up information is provided on this After Visit Summary.  MyChart is used to connect with patients for Virtual Visits (Telemedicine).  Patients are able to view lab/test results, encounter notes, upcoming appointments, etc.  Non-urgent  messages can be sent to your provider as well.   To learn more about what you can do with MyChart, go to [email or web address].    Your next appointment:   12 month(s)  The format for your next appointment:   In Person  Provider:   Sanda Klein, MD      Signed, Sanda Klein, MD  07/28/2019 6:51 PM    [place]

## 2019-07-28 ENCOUNTER — Encounter: Payer: Self-pay | Admitting: Cardiovascular Disease

## 2019-07-31 ENCOUNTER — Ambulatory Visit: Payer: Medicare Other | Attending: Internal Medicine

## 2019-07-31 DIAGNOSIS — Z23 Encounter for immunization: Secondary | ICD-10-CM

## 2019-08-07 DIAGNOSIS — N1339 Other hydronephrosis: Secondary | ICD-10-CM | POA: Diagnosis not present

## 2019-08-07 DIAGNOSIS — N200 Calculus of kidney: Secondary | ICD-10-CM | POA: Diagnosis not present

## 2019-08-17 ENCOUNTER — Telehealth: Payer: Self-pay | Admitting: Internal Medicine

## 2019-08-17 NOTE — Telephone Encounter (Signed)
Spoke with Adrian [name] regarding AWV. Patient declined to schedule at this time. SF

## 2019-08-28 DIAGNOSIS — N1339 Other hydronephrosis: Secondary | ICD-10-CM | POA: Diagnosis not present

## 2019-08-28 DIAGNOSIS — N1330 Unspecified hydronephrosis: Secondary | ICD-10-CM | POA: Diagnosis not present

## 2019-08-28 DIAGNOSIS — N281 Cyst of kidney, acquired: Secondary | ICD-10-CM | POA: Diagnosis not present

## 2019-08-30 ENCOUNTER — Ambulatory Visit: Payer: Medicare Other | Admitting: Podiatry

## 2019-08-30 ENCOUNTER — Encounter: Payer: Self-pay | Admitting: Podiatry

## 2019-08-30 ENCOUNTER — Other Ambulatory Visit: Payer: Self-pay

## 2019-08-30 VITALS — BP 150/89 | HR 55 | Temp 97.3°F | Resp 14

## 2019-08-30 DIAGNOSIS — L600 Ingrowing nail: Secondary | ICD-10-CM | POA: Diagnosis not present

## 2019-08-30 MED ORDER — SILVER SULFADIAZINE 1 % EX CREA: 1.0000 "application " | TOPICAL_CREAM | Freq: Every day | CUTANEOUS | 0 refills | Status: DC

## 2019-08-30 NOTE — Patient Instructions (Signed)

## 2019-09-04 NOTE — Progress Notes (Signed)
Subjective: 69 year old male presents the office today requesting his right third toenail be removed.  The nail is thickened discolored.  Cause discomfort with shoes.  He has tried treatment has not been helping and at this point was having early appearance of his big toenails removed previously. Denies any systemic complaints such as fevers, chills, nausea, vomiting. No acute changes since last appointment, and no other complaints at this time.   Objective: AAO x3, NAD DP/PT pulses palpable bilaterally, CRT less than 3 seconds Right third digit nail is hypertrophic and dystrophic with brown discoloration.  Tenderness the entire toenail there is no redness or drainage or any signs of infection.  No open lesions or pre-ulcerative lesions.  No pain with calf compression, swelling, warmth, erythema  Assessment: Symptomatic onychomycosis, onychodystrophy right third toenail  Plan: -All treatment options discussed with the patient including all alternatives, risks, complications.  -At this time, the patient is requesting total nail removal with chemical matricectomy to the right 3rd digit toenail. Risks and complications were discussed with the patient for which they understand and written consent was obtained. Under sterile conditions a total of 3 mL of a mixture of 2% lidocaine plain and 0.5% Marcaine plain was infiltrated in a digital block fashion. Once anesthetized, the skin was prepped in sterile fashion. A tourniquet was then applied. Next the right third nail was then sharply excised making sure to remove the entire toenail. Once the nails were ensured to be removed area was debrided and the underlying skin was intact. There is no purulence identified in the procedure. Next phenol was then applied under standard conditions and copiously irrigated. Silvadene was applied. A dry sterile dressing was applied. After application of the dressing the tourniquet was removed and there is found to be an  immediate capillary refill time to the digit. The patient tolerated the procedure well any complications. Post procedure instructions were discussed the patient for which he verbally understood. Follow-up in one week for nail check or sooner if any problems are to arise. Discussed signs/symptoms of infection and directed to call the office immediately should any occur or go directly to the emergency room. In the meantime, encouraged to call the office with any questions, concerns, changes symptoms. -Patient encouraged to call the office with any questions, concerns, change in symptoms.   Return for nail check in 1-2 weeks.  Trula Slade DPM

## 2019-09-19 DIAGNOSIS — N200 Calculus of kidney: Secondary | ICD-10-CM | POA: Diagnosis not present

## 2019-09-19 DIAGNOSIS — N1339 Other hydronephrosis: Secondary | ICD-10-CM | POA: Diagnosis not present

## 2019-09-21 ENCOUNTER — Other Ambulatory Visit: Payer: Self-pay | Admitting: Internal Medicine

## 2019-09-21 MED ORDER — LEVOTHYROXINE SODIUM 75 MCG PO TABS: 75.0000 ug | ORAL_TABLET | Freq: Every day | ORAL | 3 refills | Status: DC

## 2019-09-21 NOTE — Addendum Note (Signed)
Addended by: Karren Cobble on: 09/21/2019 03:03 PM   Modules accepted: Orders

## 2019-09-28 ENCOUNTER — Other Ambulatory Visit: Payer: Self-pay

## 2019-09-28 ENCOUNTER — Ambulatory Visit: Payer: Medicare Other | Admitting: Family Medicine

## 2019-09-28 ENCOUNTER — Ambulatory Visit (INDEPENDENT_AMBULATORY_CARE_PROVIDER_SITE_OTHER): Payer: Medicare Other

## 2019-09-28 ENCOUNTER — Encounter: Payer: Self-pay | Admitting: Family Medicine

## 2019-09-28 VITALS — BP 130/86 | HR 51 | Ht 70.0 in | Wt 215.0 lb

## 2019-09-28 DIAGNOSIS — M79672 Pain in left foot: Secondary | ICD-10-CM

## 2019-09-28 DIAGNOSIS — M76822 Posterior tibial tendinitis, left leg: Secondary | ICD-10-CM | POA: Diagnosis not present

## 2019-09-28 NOTE — Patient Instructions (Addendum)
Good too see you Heel lift in shoes when possible Use the Pennsaid Exercise 3 times a week See me again in 5 weeks

## 2019-09-28 NOTE — Progress Notes (Signed)
[facility] [address] [place] Phone: [phone] Subjective:   I Adrian [name] am serving as a Education administrator for Dr. Hulan Saas.  This visit occurred during the SARS-CoV-2 public health emergency.  Safety protocols were in place, including screening questions prior to the visit, additional usage of staff PPE, and extensive cleaning of exam room while observing appropriate contact time as indicated for disinfecting solutions.   I'm seeing this patient by the request  of:  Plotnikov, Evie Lacks, MD  CC: Left foot and ankle pain  [id number]   10/24/2018 Interventions previously: Patient last injection was June 06, 2018.   Interventions this visit: New injection given today Oct 24, 2018.  Custom bracing given today to help slow down progression. We discussed with patient the importance ergonomics, home exercises, icing regimen, and over-the-counter natural products.  Gabapentin prescribed   Future considerations: Discussed PRP or surgical intervention  Return to clinic: 6 to 8 weeks  09/28/2019 Adrian [name] is a 69 y.o. male coming in with complaint of left foot pain. Patient states the foot has been painful with activity.   Onset- chronic  Location - medial  Duration- Pain is dependent on how much he has done for the day  Character- sharp  Aggravating factors- walking  Reliving factors-  Therapies tried-  Severity- 5/10 at its worse      Past Medical History:  Diagnosis Date  . Allergic rhinitis   . Cancer ([place]) 2016   basal cell skin cancer nose  . Cataract   . GERD (gastroesophageal reflux disease)   . Hyperlipidemia   . Hypertension   . Hypothyroidism 2010   mild  . Hypothyroidism   . Trigeminal neuralgia    L.  . Vitamin B12 deficiency 2010   Past Surgical History:  Procedure Laterality Date  . ARTHRODESIS METATARSALPHALANGEAL JOINT (MTPJ) Right 07/27/2018   Procedure: Right hallux metatarsal  phalangeal joint arthrodesis;  Surgeon: Wylene Simmer, MD;  Location: [facility];  Service: Orthopedics;  Laterality: Right;  . arthroscopic surgery for chondromalacia Left 1995  . CATARACT EXTRACTION W/ INTRAOCULAR LENS  IMPLANT, BILATERAL  2017  . COLONOSCOPY    . gamma knife surgery for trigeminal neuralgia  2005  . inguinal herniorrhahpy  1991   L.  Marland Kitchen POLYPECTOMY    . TONSILLECTOMY  1959  . TRIGEMINAL NERVE DECOMPRESSION  2008   Left  Dr. Claybon Jabs   Social History   Socioeconomic History  . Marital status: Married    Spouse name: Not on file  . Number of children: 1  . Years of education: Not on file  . Highest education level: Bachelor's degree (e.g., BA, AB, BS)  Occupational History  . 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 Smoking status: Former Smoker    Quit date: 05/31/1992    Years since quitting: 27.3  . Smokeless tobacco: Never Used  Substance and Sexual Activity  . Alcohol use: Yes    Alcohol/week: 7.0 standard drinks    Types: 7 Glasses of wine per week    Comment: at least one drink per night; sometimes "we split a bottle of wine"  . Drug use: No  . Sexual activity: Yes  Other Topics Concern  . Not on file  Social History Narrative   [employer]   Married x2: 12 years 34st; 15 years ('37) 2nd   One daughter, step daugfhter, step son   Laverle Hobby  to bike   Social Determinants of Health   Financial Resource Strain:   . Difficulty of Paying Living Expenses:   Food Insecurity:   . Worried About Charity fundraiser in the Last Year:   . Arboriculturist in the Last Year:   Transportation Needs:   . Film/video editor (Medical):   Marland Kitchen Lack of Transportation (Non-Medical):   Physical Activity:   . Days of Exercise per Week:   . Minutes of Exercise per Session:   Stress:   . Feeling of Stress :   Social Connections:   . Frequency of Communication with Friends and Family:   . Frequency of Social Gatherings with  Friends and Family:   . Attends Religious Services:   . Active Member of Clubs or Organizations:   . Attends Archivist Meetings:   Marland Kitchen Marital Status:    Allergies  Allergen Reactions  . Tape Rash    Irritation  . Elavil [Amitriptyline Hcl]     constipation  . Triple Antibiotic [Bacitracin-Neomycin-Polymyxin] Rash   Family History  Problem Relation Age of Onset  . Heart disease Mother 68       MI  . Diabetes Mother   . Prostate cancer Father 25       prostate  . Breast cancer Sister 33       breast ca  . Stroke Brother   . Colon cancer Neg Hx   . Esophageal cancer Neg Hx   . Rectal cancer Neg Hx   . Stomach cancer Neg Hx     Current Outpatient Medications (Endocrine & Metabolic):  .  levothyroxine (SYNTHROID) 75 MCG tablet, Take 1 tablet (75 mcg total) by mouth daily.  Current Outpatient Medications (Cardiovascular):  .  losartan (COZAAR) 100 MG tablet, Take 1 tablet (100 mg total) by mouth daily. .  rosuvastatin (CRESTOR) 10 MG tablet, Take 1 tablet (10 mg total) by mouth daily.  Current Outpatient Medications (Respiratory):  Marland Kitchen  Cetirizine HCl (ZYRTEC ALLERGY) 10 MG CAPS,  .  diphenhydrAMINE HCl (BENADRYL ALLERGY PO),  .  fluticasone (FLONASE) 50 MCG/ACT nasal spray, Place into both nostrils daily.  Current Outpatient Medications (Analgesics):  .  aspirin EC 81 MG tablet, Take 1 tablet (81 mg total) by mouth daily.   Current Outpatient Medications (Other):  .  carbidopa-levodopa (SINEMET IR) 25-100 MG tablet, Take 1 tablet by mouth 3 (three) times daily. (Patient taking differently: Take 1 tablet by mouth 4 (four) times daily. ) .  halobetasol (ULTRAVATE) 0.05 % cream, APPLY CREAM TOPICALLY 2 TIMES DAILY AS NEEDED .  Misc Natural Products (TART CHERRY ADVANCED) CAPS, Take by mouth daily. .  Multiple Vitamins-Minerals (CENTRUM SILVER ADULT 50+ PO), Take 1 tablet by mouth at bedtime. .  Probiotic Product (PROBIOTIC DAILY PO), Take 1 tablet by mouth at  bedtime. .  sertraline (ZOLOFT) 25 MG tablet, Take 25 mg by mouth daily. .  silver sulfADIAZINE (SILVADENE) 1 % cream, Apply 1 application topically daily. .  traZODone (DESYREL) 50 MG tablet, TAKE 1/2 TO 1 (ONE-HALF TO ONE) TABLET BY MOUTH AT BEDTIME AS NEEDED FOR SLEEP .  TURMERIC PO, Take by mouth daily. Marland Kitchen  VALERIAN ROOT PO, Take 1,500 mg by mouth at bedtime. .  Vitamin D, Ergocalciferol, (DRISDOL) 1.25 MG (50000 UT) CAPS capsule, TAKE 1 CAPSULE BY MOUTH ONCE A WEEK .  Zinc 50 MG TABS, Take 1 tablet by mouth every morning.     Reviewed prior external information including  notes and imaging from  primary care provider As well as notes that were available from care everywhere and other healthcare systems.  Past medical history, social, surgical and family history all reviewed in electronic medical record.  No pertanent information unless stated regarding to the chief complaint.   Review of Systems:  No headache, visual changes, nausea, vomiting, diarrhea, constipation, dizziness, abdominal pain, skin rash, fevers, chills, night sweats, weight loss, swollen lymph nodes, body aches, joint swelling, chest pain, shortness of breath, mood changes. POSITIVE muscle aches  Objective  Blood pressure 130/86, pulse (!) 51, height 5\' 10"  (1.778 m), weight 215 lb (97.5 kg), SpO2 97 %.   General: apparent distress alert and oriented x3 mood and affect normal, dressed appropriately.  Mild masked facies HEENT: Pupils equal, extraocular movements intact  Respiratory: Patient's speak in full sentences and does not appear short of breath  Cardiovascular: No lower extremity edema, non tender, no erythema  Neuro: Cranial nerves II through XII are intact, neurovascularly intact in all extremities with 2+ DTRs and 2+ pulses.  Gait antalgic. Wide base gait patient does have a rolling pill tremor noted of the left upper extremity.  Foot exam shows the patient has severe pes planus with overpronation of the  hindfoot and midfoot.  Mild tenderness over the navicular bone but more over the insertion of the posterior tibialis tendon.  Tightness of the posterior cord of the ankle noted.  Mild cogwheeling noted of the knee and upper lower extremity.  Limited musculoskeletal ultrasound was performed and interpreted by .me  Limited ultrasound of patient's navicular bone seems to be fairly unremarkable.  Patient though does have what appears to be a very small bursitis at the insertion of the posterior tibialis tendon.  Patient does have some chronic tearing noted potentially of the posterior tibialis with increasing Doppler flow.    Impression and Recommendations:     This case required medical decision making of moderate complexity. The above documentation has been reviewed and is accurate and complete Lyndal Pulley, DO       Note: This dictation was prepared with Dragon dictation along with smaller phrase technology. Any transcriptional errors that result from this process are unintentional.

## 2019-09-28 NOTE — Assessment & Plan Note (Signed)
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  Social determinants of health including patient's Parkinson's and does have tremors now at this time.  I do think that this is contributing.  Topical anti-inflammatories given and will avoid oral secondary to chronic renal failure if possible.  Discussed home exercise, heel lift given today.  Follow-up again 4 to 6 weeks to further evaluate likely with ultrasound as well

## 2019-10-16 ENCOUNTER — Telehealth: Payer: Self-pay | Admitting: Cardiovascular Disease

## 2019-10-16 NOTE — Telephone Encounter (Signed)
   [facility] Pre-operative Risk Assessment    HEARTCARE STAFF: - Please ensure there is not already an duplicate clearance open for this procedure - Under Visit Info/Reason for Call, type in Other and utilize the format Clearance MM/DD/YY or Clearance TBD  Request for surgical clearance:  1. What type of surgery is being performed? Cystoscopy with bilateral retrograde pyelogram. Bilateral ureteroscopy with laser lithotripsy  2. When is this surgery scheduled? 10/19/19  3. What type of clearance is required (medical clearance vs. Pharmacy clearance to hold med vs. Both)? Medical and pharmacy  4. Are there any medications that need to be held prior to surgery and how long? Aspirin 5 days prior  5. Practice name and name of physician performing surgery? Festus Aloe at [facility]  6. What is the office phone number? [place]   7.   What is the office fax number? [phone]  8.   Anesthesia type (None, local, MAC, general) ? General   Adrian [name] 10/16/2019, 2:08 PM  _________________________________________________________________   (provider comments below)

## 2019-10-16 NOTE — Telephone Encounter (Signed)
Patient following up about surgical clearance.

## 2019-10-17 NOTE — Telephone Encounter (Signed)
Dr. Sallyanne Kuster   This patient is to undergo a cystoscopy with bilateral retrograde pyelogram and bilateral ureteroscopy with laser lithotripsy  You saw him recently for an elevated calcium score and family hx of CAD at which time the exercise stress test was delayed. Procedure team is asking for holding recs for his ASA therapy.   Could you send your holding recommendations to the pre-op pool?    Thank you Sharee Pimple

## 2019-10-17 NOTE — Telephone Encounter (Signed)
   Primary Cardiologist: Sanda Klein, MD  Chart reviewed as part of pre-operative protocol coverage. Georgia Lopes would be at acceptable risk for the planned procedure without further cardiovascular testing.   Spoke with the patient today, 10/17/19 and he continues to be without anginal symptoms despite performing well beyond 4 METS. Spoke with Dr. Percival Spanish, [place] [facility] about holding ASA and stress test that was dicussed at the patients OV with Dr. Sallyanne Kuster. Dr. Percival Spanish feels that he is able to proceed with the procedure without issue and the stress test can be performed a a later time. He can hold the ASA 3-5 days prior to procedure and resume when safe thereafter.   I will route this recommendation to the requesting party via Epic fax function and remove from pre-op pool.  Please call with questions.  Kathyrn Drown, NP 10/17/2019, 4:54 PM

## 2019-10-17 NOTE — Telephone Encounter (Signed)
Follow up   Adrian [name] from [facility] is calling to follow up on an urgent  clearance request    Please advise

## 2019-10-18 ENCOUNTER — Ambulatory Visit: Payer: Medicare Other | Admitting: Internal Medicine

## 2019-10-19 DIAGNOSIS — N1330 Unspecified hydronephrosis: Secondary | ICD-10-CM | POA: Diagnosis not present

## 2019-10-19 DIAGNOSIS — N261 Atrophy of kidney (terminal): Secondary | ICD-10-CM | POA: Diagnosis not present

## 2019-10-19 DIAGNOSIS — N201 Calculus of ureter: Secondary | ICD-10-CM | POA: Diagnosis not present

## 2019-11-05 DIAGNOSIS — H0011 Chalazion right upper eyelid: Secondary | ICD-10-CM | POA: Diagnosis not present

## 2019-11-07 ENCOUNTER — Ambulatory Visit: Payer: Medicare Other | Admitting: Family Medicine

## 2019-11-19 DIAGNOSIS — G20 Parkinson's disease: Secondary | ICD-10-CM | POA: Diagnosis not present

## 2019-12-06 ENCOUNTER — Encounter: Payer: Self-pay | Admitting: Internal Medicine

## 2019-12-06 ENCOUNTER — Other Ambulatory Visit: Payer: Self-pay

## 2019-12-06 ENCOUNTER — Ambulatory Visit (INDEPENDENT_AMBULATORY_CARE_PROVIDER_SITE_OTHER): Payer: Medicare Other | Admitting: Internal Medicine

## 2019-12-06 DIAGNOSIS — N529 Male erectile dysfunction, unspecified: Secondary | ICD-10-CM

## 2019-12-06 DIAGNOSIS — N261 Atrophy of kidney (terminal): Secondary | ICD-10-CM | POA: Diagnosis not present

## 2019-12-06 DIAGNOSIS — N183 Chronic kidney disease, stage 3 unspecified: Secondary | ICD-10-CM | POA: Diagnosis not present

## 2019-12-06 DIAGNOSIS — I2583 Coronary atherosclerosis due to lipid rich plaque: Secondary | ICD-10-CM

## 2019-12-06 DIAGNOSIS — R251 Tremor, unspecified: Secondary | ICD-10-CM

## 2019-12-06 DIAGNOSIS — E538 Deficiency of other specified B group vitamins: Secondary | ICD-10-CM

## 2019-12-06 DIAGNOSIS — I129 Hypertensive chronic kidney disease with stage 1 through stage 4 chronic kidney disease, or unspecified chronic kidney disease: Secondary | ICD-10-CM | POA: Diagnosis not present

## 2019-12-06 DIAGNOSIS — G20 Parkinson's disease: Secondary | ICD-10-CM | POA: Diagnosis not present

## 2019-12-06 DIAGNOSIS — N1831 Chronic kidney disease, stage 3a: Secondary | ICD-10-CM | POA: Diagnosis not present

## 2019-12-06 DIAGNOSIS — I2510 Atherosclerotic heart disease of native coronary artery without angina pectoris: Secondary | ICD-10-CM | POA: Diagnosis not present

## 2019-12-06 MED ORDER — SILDENAFIL CITRATE 100 MG PO TABS: 50.0000 mg | ORAL_TABLET | Freq: Every day | ORAL | 5 refills | Status: DC | PRN

## 2019-12-06 NOTE — Assessment & Plan Note (Signed)
On B12 

## 2019-12-06 NOTE — Assessment & Plan Note (Signed)
Seeing Dr Alroy Dust at [facility]

## 2019-12-06 NOTE — Assessment & Plan Note (Signed)
Adrian [name] had a kidney stone procedure - had labs this am..Marland Kitchen

## 2019-12-06 NOTE — Assessment & Plan Note (Signed)
Viagra prn 

## 2019-12-06 NOTE — Progress Notes (Signed)
Subjective:  Patient ID: Adrian [name], male    DOB: [birth year]  Age: 69 y.o. MRN: [id number]  CC: Follow-up (4 month follow-up)   HPI Adrian [name] presents for CAD, HTN, Parkinson's f/u. Adrian [name] had a kidney stone procedure - had labs this am...  Outpatient Medications Prior to Visit  Medication Sig Dispense Refill   aspirin EC 81 MG tablet Take 1 tablet (81 mg total) by mouth daily. 100 tablet 3   carbidopa-levodopa (SINEMET IR) 25-100 MG tablet Take 1 tablet by mouth 3 (three) times daily. (Patient taking differently: Take 1 tablet by mouth 4 (four) times daily. ) 270 tablet 1   Cetirizine HCl (ZYRTEC ALLERGY) 10 MG CAPS      diphenhydrAMINE HCl (BENADRYL ALLERGY PO)      fluticasone (FLONASE) 50 MCG/ACT nasal spray Place into both nostrils daily.     halobetasol (ULTRAVATE) 0.05 % cream APPLY CREAM TOPICALLY 2 TIMES DAILY AS NEEDED 45 g 0   levothyroxine (SYNTHROID) 75 MCG tablet Take 1 tablet (75 mcg total) by mouth daily. 90 tablet 3   losartan (COZAAR) 100 MG tablet Take 1 tablet (100 mg total) by mouth daily. 90 tablet 3   Misc Natural Products (TART CHERRY ADVANCED) CAPS Take by mouth daily.     Multiple Vitamins-Minerals (CENTRUM SILVER ADULT 50+ PO) Take 1 tablet by mouth at bedtime.     Probiotic Product (PROBIOTIC DAILY PO) Take 1 tablet by mouth at bedtime.     rosuvastatin (CRESTOR) 10 MG tablet Take 1 tablet (10 mg total) by mouth daily. 30 tablet 11   sertraline (ZOLOFT) 25 MG tablet Take 25 mg by mouth daily.     silver sulfADIAZINE (SILVADENE) 1 % cream Apply 1 application topically daily. 50 g 0   traZODone (DESYREL) 50 MG tablet TAKE 1/2 TO 1 (ONE-HALF TO ONE) TABLET BY MOUTH AT BEDTIME AS NEEDED FOR SLEEP 90 tablet 3   TURMERIC PO Take by mouth daily.     VALERIAN ROOT PO Take 1,500 mg by mouth at bedtime.     Vitamin D, Ergocalciferol, (DRISDOL) 1.25 MG (50000 UT) CAPS capsule TAKE 1 CAPSULE BY MOUTH ONCE A WEEK 12 capsule 0   Zinc 50 MG  TABS Take 1 tablet by mouth every morning.       No facility-administered medications prior to visit.    ROS: Review of Systems  Constitutional: Negative for appetite change, fatigue and unexpected weight change.  HENT: Negative for congestion, nosebleeds, sneezing, sore throat and trouble swallowing.   Eyes: Negative for itching and visual disturbance.  Respiratory: Negative for cough.   Cardiovascular: Negative for chest pain, palpitations and leg swelling.  Gastrointestinal: Negative for abdominal distention, blood in stool, diarrhea and nausea.  Genitourinary: Negative for frequency and hematuria.  Musculoskeletal: Negative for back pain, gait problem, joint swelling and neck pain.  Skin: Negative for rash.  Neurological: Negative for dizziness, tremors, speech difficulty and weakness.  Psychiatric/Behavioral: Negative for agitation, dysphoric mood and sleep disturbance. The patient is not nervous/anxious.     Objective:  There were no vitals taken for this visit.  BP Readings from Last 3 Encounters:  09/28/19 130/86  08/30/19 (!) 150/89  07/27/19 140/80    Wt Readings from Last 3 Encounters:  09/28/19 215 lb (97.5 kg)  07/27/19 210 lb (95.3 kg)  06/20/19 211 lb (95.7 kg)    Physical Exam Constitutional:      General: He is not in acute distress.  Appearance: He is well-developed.     Comments: NAD  Eyes:     Conjunctiva/sclera: Conjunctivae normal.     Pupils: Pupils are equal, round, and reactive to light.  Neck:     Thyroid: No thyromegaly.     Vascular: No JVD.  Cardiovascular:     Rate and Rhythm: Normal rate and regular rhythm.     Heart sounds: Normal heart sounds. No murmur heard.  No friction rub. No gallop.   Pulmonary:     Effort: Pulmonary effort is normal. No respiratory distress.     Breath sounds: Normal breath sounds. No wheezing or rales.  Chest:     Chest wall: No tenderness.  Abdominal:     General: Bowel sounds are normal. There is no  distension.     Palpations: Abdomen is soft. There is no mass.     Tenderness: There is no abdominal tenderness. There is no guarding or rebound.  Musculoskeletal:        General: No tenderness. Normal range of motion.     Cervical back: Normal range of motion.  Lymphadenopathy:     Cervical: No cervical adenopathy.  Skin:    General: Skin is warm and dry.     Findings: No rash.  Neurological:     Mental Status: He is alert and oriented to person, place, and time.     Cranial Nerves: No cranial nerve deficit.     Motor: No abnormal muscle tone.     Coordination: Coordination normal.     Gait: Gait normal.     Deep Tendon Reflexes: Reflexes are normal and symmetric.  Psychiatric:        Behavior: Behavior normal.        Thought Content: Thought content normal.        Judgment: Judgment normal.   slow movements, tremor   Lab Results  Component Value Date   WBC 4.8 06/20/2019   HGB 14.1 06/20/2019   HCT 41.7 06/20/2019   PLT 140.0 (L) 06/20/2019   GLUCOSE 105 (H) 06/20/2019   CHOL 143 06/20/2019   TRIG 128.0 06/20/2019   HDL 44.30 06/20/2019   LDLDIRECT 66.0 05/10/2016   LDLCALC 73 06/20/2019   ALT 8 06/20/2019   AST 15 06/20/2019   NA 137 06/20/2019   K 3.9 06/20/2019   CL 103 06/20/2019   CREATININE 1.46 06/20/2019   BUN 26 (H) 06/20/2019   CO2 26 06/20/2019   TSH 3.21 06/20/2019   PSA 1.14 06/20/2019   INR 1.0 RATIO 09/18/2007   HGBA1C 5.6 06/20/2019    US RENAL ARTERY DUPLEX COMPLETE  Result Date: 07/24/2019 CLINICAL DATA:  69 year old male with atrophic right kidney and stage 3 chronic kidney disease EXAM: RENAL/URINARY TRACT ULTRASOUND RENAL DUPLEX DOPPLER ULTRASOUND COMPARISON:  Renal ultrasound 01/04/2019 FINDINGS: Right Kidney: Length: 8.4 x 3.1 x 3.1 cm for a total volume of 43 mL. Diffuse symmetric renal cortical atrophy with lipomatous hypertrophy of the renal hilum. No hydronephrosis, renal mass or nephrolithiasis identified. Left Kidney: Length: 13.1 x  5.7 x 5.6 cm for a total volume of 222 mL. Mild compensatory hypertrophy. Mild fullness of the renal pelvis and infundibulum. No renal mass or nephrolithiasis. Bladder: Within normal limits. The prostate gland is mildly enlarged at 29 mL. RENAL DUPLEX ULTRASOUND Right Renal Artery Velocities: Origin:  60 cm/sec Mid:  Unable to visualize Hilum:  42 cm/sec Interlobar:  15 cm/sec Arcuate:  13 cm/sec Left Renal Artery Velocities: Origin:  62 cm/sec Mid:  62 cm/sec Hilum:  58 cm/sec Interlobar:  48 cm/sec Arcuate:  31 cm/sec Aortic Velocity:  78 cm/sec Right Renal-Aortic Ratios: Origin: 0.8 Mid:  Unable to assess Hilum: 0.5 Interlobar: 0.2 Arcuate: 0.2 Left Renal-Aortic Ratios: Origin: 0.8 Mid: 0.8 Hilum: 0.7 Interlobar: 0.6 Arcuate: 0.4 Renal veins: The bilateral renal veins are patent at the hila. IMPRESSION: 1. Chronically atrophied right kidney without evidence of hemodynamically significant renal artery stenosis or occlusion. 2. Compensatory hypertrophy of the left kidney suggests that the right-sided renal atrophy is a chronic abnormality. 3. Mild left-sided hydronephrosis of uncertain etiology. 4. Mild prostatomegaly. 5. No evidence of nephrolithiasis or renal mass. Signed, Criselda Peaches, MD, [facility] Electronically Signed   By: Jacqulynn Cadet M.D.   On: 07/24/2019 17:10    Assessment & Plan:    Walker Kehr, MD

## 2019-12-06 NOTE — Assessment & Plan Note (Addendum)
On Crestor, ASA 

## 2019-12-17 DIAGNOSIS — N261 Atrophy of kidney (terminal): Secondary | ICD-10-CM | POA: Diagnosis not present

## 2020-01-17 ENCOUNTER — Ambulatory Visit (INDEPENDENT_AMBULATORY_CARE_PROVIDER_SITE_OTHER): Payer: Medicare Other

## 2020-01-17 ENCOUNTER — Encounter: Payer: Self-pay | Admitting: Family Medicine

## 2020-01-17 ENCOUNTER — Ambulatory Visit: Payer: Medicare Other | Admitting: Family Medicine

## 2020-01-17 ENCOUNTER — Other Ambulatory Visit: Payer: Self-pay

## 2020-01-17 VITALS — BP 132/84 | HR 51 | Ht 70.0 in | Wt 209.0 lb

## 2020-01-17 DIAGNOSIS — R29898 Other symptoms and signs involving the musculoskeletal system: Secondary | ICD-10-CM | POA: Diagnosis not present

## 2020-01-17 DIAGNOSIS — M542 Cervicalgia: Secondary | ICD-10-CM

## 2020-01-17 MED ORDER — GABAPENTIN 100 MG PO CAPS
200.0000 mg | ORAL_CAPSULE | Freq: Every day | ORAL | 3 refills | Status: DC
Start: 2020-01-17 — End: 2020-07-14

## 2020-01-17 NOTE — Progress Notes (Signed)
[facility] [address] [place] Phone: [phone] Subjective:   I [name] am serving as a Education administrator for Dr. Hulan Saas.  This visit occurred during the SARS-CoV-2 public health emergency.  Safety protocols were in place, including screening questions prior to the visit, additional usage of staff PPE, and extensive cleaning of exam room while observing appropriate contact time as indicated for disinfecting solutions.   I'm seeing this patient by the request  of:  Plotnikov, Evie Lacks, MD  CC: Left hand weakness  [id number]   09/28/2019 Patient does have posterior tibialis tendinitis.  Social determinants of health including patient's Parkinson's and does have tremors now at this time.  I do think that this is contributing.  Topical anti-inflammatories given and will avoid oral secondary to chronic renal failure if possible.  Discussed home exercise, heel lift given today.  Follow-up again 4 to 6 weeks to further evaluate likely with ultrasound as well  Update 01/17/2020 Galo Sayed is a 69 y.o. male coming in with complaint of left hand weakness. Patient states he went on a bike ride and the next day it felt like he slept on it wrong and could barely move his hand. Weakness has gotten better but he is having trouble with ADLs. Not really painful the first and second fingers are the most weak.  Patient does state that it is seeming to improve very slowly.      Past Medical History:  Diagnosis Date  . Allergic rhinitis   . Cancer ([place]) 2016   basal cell skin cancer nose  . Cataract   . GERD (gastroesophageal reflux disease)   . Hyperlipidemia   . Hypertension   . Hypothyroidism 2010   mild  . Hypothyroidism   . Trigeminal neuralgia    L.  . Vitamin B12 deficiency 2010   Past Surgical History:  Procedure Laterality Date  . ARTHRODESIS METATARSALPHALANGEAL JOINT (MTPJ) Right 07/27/2018   Procedure: Right hallux  metatarsal phalangeal joint arthrodesis;  Surgeon: Wylene Simmer, MD;  Location: [facility];  Service: Orthopedics;  Laterality: Right;  . arthroscopic surgery for chondromalacia Left 1995  . CATARACT EXTRACTION W/ INTRAOCULAR LENS  IMPLANT, BILATERAL  2017  . COLONOSCOPY    . gamma knife surgery for trigeminal neuralgia  2005  . inguinal herniorrhahpy  1991   L.  Marland Kitchen POLYPECTOMY    . TONSILLECTOMY  1959  . TRIGEMINAL NERVE DECOMPRESSION  2008   Left  Dr. Claybon Jabs   Social History   Socioeconomic History  . Marital status: Married    Spouse name: Not on file  . Number of children: 1  . Years of education: Not on file  . Highest education level: Bachelor's degree (e.g., BA, AB, BS)  Occupational History  . 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 Smoking status: Former Smoker    Quit date: 05/31/1992    Years since quitting: 27.6  . Smokeless tobacco: Never Used  Vaping Use  . Vaping Use: Never used  Substance and Sexual Activity  . Alcohol use: Yes    Alcohol/week: 7.0 standard drinks    Types: 7 Glasses of wine per week    Comment: at least one drink per night; sometimes "we split a bottle of wine"  . Drug use: No  . Sexual activity: Yes  Other Topics Concern  . Not on file  Social History Narrative   UCD  Married x2: 12 years 25st; 15 years ('17) 2nd   One daughter, step daugfhter, step son   Laverle Hobby to bike   Social Determinants of Radio broadcast assistant Strain:   . Difficulty of Paying Living Expenses: Not on file  Food Insecurity:   . Worried About Charity fundraiser in the Last Year: Not on file  . Ran Out of Food in the Last Year: Not on file  Transportation Needs:   . Lack of Transportation (Medical): Not on file  . Lack of Transportation (Non-Medical): Not on file  Physical Activity:   . Days of Exercise per Week: Not on file  . Minutes of Exercise per Session: Not on file  Stress:   . Feeling of  Stress : Not on file  Social Connections:   . Frequency of Communication with Friends and Family: Not on file  . Frequency of Social Gatherings with Friends and Family: Not on file  . Attends Religious Services: Not on file  . Active Member of Clubs or Organizations: Not on file  . Attends Archivist Meetings: Not on file  . Marital Status: Not on file   Allergies  Allergen Reactions  . Tape Rash    Irritation  . Elavil [Amitriptyline Hcl]     constipation  . Neomy-Bacit-Polymyx-Pramoxine Rash    Itching   . Other Rash  . Triple Antibiotic [Bacitracin-Neomycin-Polymyxin] Rash   Family History  Problem Relation Age of Onset  . Heart disease Mother 105       MI  . Diabetes Mother   . Prostate cancer Father 10       prostate  . Breast cancer Sister 30       breast ca  . Stroke Brother   . Colon cancer Neg Hx   . Esophageal cancer Neg Hx   . Rectal cancer Neg Hx   . Stomach cancer Neg Hx     Current Outpatient Medications (Endocrine & Metabolic):  .  levothyroxine (SYNTHROID) 75 MCG tablet, Take 1 tablet (75 mcg total) by mouth daily.  Current Outpatient Medications (Cardiovascular):  .  losartan (COZAAR) 100 MG tablet, Take 1 tablet (100 mg total) by mouth daily. .  rosuvastatin (CRESTOR) 10 MG tablet, Take 1 tablet (10 mg total) by mouth daily. .  sildenafil (VIAGRA) 100 MG tablet, Take 0.5-1 tablets (50-100 mg total) by mouth daily as needed for erectile dysfunction.  Current Outpatient Medications (Respiratory):  Marland Kitchen  Cetirizine HCl (ZYRTEC ALLERGY) 10 MG CAPS,  .  diphenhydrAMINE HCl (BENADRYL ALLERGY PO),  .  fluticasone (FLONASE) 50 MCG/ACT nasal spray, Place into both nostrils daily.  Current Outpatient Medications (Analgesics):  .  aspirin EC 81 MG tablet, Take 1 tablet (81 mg total) by mouth daily.   Current Outpatient Medications (Other):  .  carbidopa-levodopa (SINEMET IR) 25-100 MG tablet, Take 1 tablet by mouth 3 (three) times daily. (Patient  taking differently: Take 1 tablet by mouth 4 (four) times daily. ) .  halobetasol (ULTRAVATE) 0.05 % cream, APPLY CREAM TOPICALLY 2 TIMES DAILY AS NEEDED .  Misc Natural Products (TART CHERRY ADVANCED) CAPS, Take by mouth daily. .  Multiple Vitamins-Minerals (CENTRUM SILVER ADULT 50+ PO), Take 1 tablet by mouth at bedtime. Marland Kitchen  ofloxacin (OCUFLOX) 0.3 % ophthalmic solution, Place 1 drop into the right eye 3 (three) times daily. .  Probiotic Product (PROBIOTIC DAILY PO), Take 1 tablet by mouth at bedtime. .  sertraline (ZOLOFT) 25 MG tablet, Take 25 mg  by mouth daily. .  silver sulfADIAZINE (SILVADENE) 1 % cream, Apply 1 application topically daily. .  traZODone (DESYREL) 50 MG tablet, TAKE 1/2 TO 1 (ONE-HALF TO ONE) TABLET BY MOUTH AT BEDTIME AS NEEDED FOR SLEEP .  TURMERIC PO, Take by mouth daily. Marland Kitchen  VALERIAN ROOT PO, Take 1,500 mg by mouth at bedtime. .  Zinc 50 MG TABS, Take 1 tablet by mouth every morning.   .  gabapentin (NEURONTIN) 100 MG capsule, Take 2 capsules (200 mg total) by mouth at bedtime.   Reviewed prior external information including notes and imaging from  primary care provider As well as notes that were available from care everywhere and other healthcare systems.  Past medical history, social, surgical and family history all reviewed in electronic medical record.  No pertanent information unless stated regarding to the chief complaint.   Review of Systems:  No headache, visual changes, nausea, vomiting, diarrhea, constipation, dizziness, abdominal pain, skin rash, fevers, chills, night sweats, weight loss, swollen lymph nodes, body aches, joint swelling, chest pain, shortness of breath, mood changes. POSITIVE muscle aches  Objective  Blood pressure 132/84, pulse (!) 51, height 5\' 10"  (1.778 m), weight 209 lb (94.8 kg), SpO2 97 %.    General: No apparent distress alert and oriented x3 mood and affect normal, dressed appropriately.  Masked facies noted with a resting  tremor of the left upper extremity HEENT: Pupils equal, extraocular movements intact no abnormality of the face or any weakness of the cranial nerves noted Respiratory: Patient's speak in full sentences and does not appear short of breath  Cardiovascular: No lower extremity edema, non tender, no erythema  Neuro: Cranial nerves II through XII are intact, neurovascularly intact in all extremities with 2+ DTRs and 2+ pulses.  Gait normal with good balance and coordination.  MSK:  Non tender with full range of motion and good stability and symmetric strength and tone of shoulders, elbows, wrist, hip, knee and ankles bilaterally.  Very mild cogwheeling noted Neck exam shows the patient does have some mild loss of lordosis and lacks last 10 degrees of extension.  Negative Spurling's.  Patient does have significant weakness in the C8 distribution on the left compared to the right.  No thenar or hypothenar eminence wasting noted.  Grip strength though is weak on the left compared to the right.  Negative Phalen's or Tinel's test at the carpal tunnel noted.   Impression and Recommendations:     The above documentation has been reviewed and is accurate and complete Lyndal Pulley, DO       Note: This dictation was prepared with Dragon dictation along with smaller phrase technology. Any transcriptional errors that result from this process are unintentional.

## 2020-01-17 NOTE — Patient Instructions (Signed)
Good to see you Gabapentin 200 at night Xray today Back exercises Send me a message in 1-2 weeks with an update See me again in 5-6 weeks to test strength

## 2020-01-17 NOTE — Assessment & Plan Note (Signed)
Left hand weakness.  Discussed with patient in great length about this could possibly be more of a bike handle palsy, discussed home exercises, icing regimen, discussed possible radicular symptoms from the neck and x-rays ordered today.  Underlying tremor seems to be minorly worse.  Started a very low dose of gabapentin that I think can be beneficial.  Increase activity slowly.  Follow-up with me again in 5 to 6 weeks encourage him to seek medical attention know if things worsen abruptly again.  No sign of any type of stroke at the moment and patient is already on a statin, losartan and aspirin daily.  Follow-up with me

## 2020-01-18 ENCOUNTER — Encounter: Payer: Self-pay | Admitting: Family Medicine

## 2020-02-18 ENCOUNTER — Other Ambulatory Visit: Payer: Self-pay

## 2020-02-18 ENCOUNTER — Ambulatory Visit (INDEPENDENT_AMBULATORY_CARE_PROVIDER_SITE_OTHER): Payer: Medicare Other

## 2020-02-18 DIAGNOSIS — Z23 Encounter for immunization: Secondary | ICD-10-CM | POA: Diagnosis not present

## 2020-02-27 NOTE — Progress Notes (Signed)
[facility] [place] Phone: [phone] Subjective:   Adrian Adrian [name], am serving as a scribe for Adrian Adrian [name] This visit occurred during the SARS-CoV-2 public health emergency.  Safety protocols were in place, including screening questions prior to the visit, additional usage of staff PPE, and extensive cleaning of exam room while observing appropriate contact time as indicated for disinfecting solutions.   I'm seeing this patient by the request  of:  Plotnikov, Evie Lacks, MD  CC: Left hand weakness follow-up  TDV:[id number]   01/17/2020 Left hand weakness.  Discussed with patient in great length about this could possibly be more of a bike handle palsy, discussed home exercises, icing regimen, discussed possible radicular symptoms from the neck and x-rays ordered today.  Underlying tremor seems to be minorly worse.  Started a very low dose of gabapentin that I think can be beneficial.  Increase activity slowly.  Follow-up with me again in 5 to 6 weeks encourage him to seek medical attention know if things worsen abruptly again.  Adrian [name] sign of any type of stroke at the moment and patient is already on a statin, losartan and aspirin daily.  Follow-up with me   Update 02/28/2020 Adrian Adrian [name] is a 69 y.o. male coming in with complaint of left hand weakness. States that he has been biking and did fall asleep and woke up unable to make a fist. Does feel like he is stronger.  Patient does not know if it is anything else other than the weakness of the hand.  Since then is feeling significantly stronger and Adrian [name] problem.  Still has the tremor on the left hand  Also requesting CMC joint injection in right hand.  Last injection was August 2020.       Past Medical History:  Diagnosis Date  . Allergic rhinitis   . Cancer ([place]) 2016   basal cell skin cancer nose  . Cataract   . GERD (gastroesophageal reflux disease)   . Hyperlipidemia    . Hypertension   . Hypothyroidism 2010   mild  . Hypothyroidism   . Trigeminal neuralgia    L.  . Vitamin B12 deficiency 2010   Past Surgical History:  Procedure Laterality Date  . ARTHRODESIS METATARSALPHALANGEAL JOINT (MTPJ) Right 07/27/2018   Procedure: Right hallux metatarsal phalangeal joint arthrodesis;  Surgeon: Adrian Simmer, MD;  Location: [facility];  Service: Orthopedics;  Laterality: Right;  . arthroscopic surgery for chondromalacia Left 1995  . CATARACT EXTRACTION W/ INTRAOCULAR LENS  IMPLANT, BILATERAL  2017  . COLONOSCOPY    . gamma knife surgery for trigeminal neuralgia  2005  . inguinal herniorrhahpy  1991   L.  Marland Kitchen POLYPECTOMY    . TONSILLECTOMY  1959  . TRIGEMINAL NERVE DECOMPRESSION  2008   Left  Adrian Adrian [name]   Social History   Socioeconomic History  . Marital status: Married    Spouse name: Not on file  . Number of children: 1  . Years of education: Not on file  . Highest education level: Bachelor's degree (e.g., BA, AB, BS)  Occupational History  . 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 Smoking status: Former Smoker    Quit date: 05/31/1992    Years since quitting: 27.7  . Smokeless tobacco: Never Used  Vaping Use  . Vaping Use: Never used  Substance and Sexual Activity  . Alcohol use: Yes  Alcohol/week: 7.0 standard drinks    Types: 7 Glasses of wine per week    Comment: at least one drink per night; sometimes "we split a bottle of wine"  . Drug use: Adrian [name]  . Sexual activity: Yes  Other Topics Concern  . Not on file  Social History Narrative   UCD   Married x2: 12 years 35st; 15 years ('41) 2nd   One daughter, step daugfhter, step son   Adrian Adrian [name] to bike   Social Determinants of Health   Financial Resource Strain:   . Difficulty of Paying Living Expenses: Not on file  Food Insecurity:   . Worried About Charity fundraiser in the Last Year: Not on file  . Ran Out of Food in the Last Year:  Not on file  Transportation Needs:   . Lack of Transportation (Medical): Not on file  . Lack of Transportation (Non-Medical): Not on file  Physical Activity:   . Days of Exercise per Week: Not on file  . Minutes of Exercise per Session: Not on file  Stress:   . Feeling of Stress : Not on file  Social Connections:   . Frequency of Communication with Friends and Family: Not on file  . Frequency of Social Gatherings with Friends and Family: Not on file  . Attends Religious Services: Not on file  . Active Member of Clubs or Organizations: Not on file  . Attends Archivist Meetings: Not on file  . Marital Status: Not on file   Allergies  Allergen Reactions  . Tape Rash    Irritation  . Elavil [Amitriptyline Hcl]     constipation  . Neomy-Bacit-Polymyx-Pramoxine Rash    Itching   . Other Rash  . Triple Antibiotic [Bacitracin-Neomycin-Polymyxin] Rash   Family History  Problem Relation Age of Onset  . Heart disease Mother 44       MI  . Diabetes Mother   . Prostate cancer Father 49       prostate  . Breast cancer Sister 54       breast ca  . Stroke Brother   . Colon cancer Neg Hx   . Esophageal cancer Neg Hx   . Rectal cancer Neg Hx   . Stomach cancer Neg Hx     Current Outpatient Medications (Endocrine & Metabolic):  .  levothyroxine (SYNTHROID) 75 MCG tablet, Take 1 tablet (75 mcg total) by mouth daily.  Current Outpatient Medications (Cardiovascular):  .  losartan (COZAAR) 100 MG tablet, Take 1 tablet (100 mg total) by mouth daily. .  rosuvastatin (CRESTOR) 10 MG tablet, Take 1 tablet (10 mg total) by mouth daily. .  sildenafil (VIAGRA) 100 MG tablet, Take 0.5-1 tablets (50-100 mg total) by mouth daily as needed for erectile dysfunction.  Current Outpatient Medications (Respiratory):  Marland Kitchen  Cetirizine HCl (ZYRTEC ALLERGY) 10 MG CAPS,  .  diphenhydrAMINE HCl (BENADRYL ALLERGY PO),  .  fluticasone (FLONASE) 50 MCG/ACT nasal spray, Place into both nostrils  daily.  Current Outpatient Medications (Analgesics):  .  aspirin EC 81 MG tablet, Take 1 tablet (81 mg total) by mouth daily.   Current Outpatient Medications (Other):  .  carbidopa-levodopa (SINEMET IR) 25-100 MG tablet, Take 1 tablet by mouth 3 (three) times daily. (Patient taking differently: Take 1 tablet by mouth 4 (four) times daily. ) .  gabapentin (NEURONTIN) 100 MG capsule, Take 2 capsules (200 mg total) by mouth at bedtime. .  halobetasol (ULTRAVATE) 0.05 % cream, APPLY CREAM TOPICALLY 2  TIMES DAILY AS NEEDED .  Misc Natural Products (TART CHERRY ADVANCED) CAPS, Take by mouth daily. .  Multiple Vitamins-Minerals (CENTRUM SILVER ADULT 50+ PO), Take 1 tablet by mouth at bedtime. Marland Kitchen  ofloxacin (OCUFLOX) 0.3 % ophthalmic solution, Place 1 drop into the right eye 3 (three) times daily. .  Probiotic Product (PROBIOTIC DAILY PO), Take 1 tablet by mouth at bedtime. .  sertraline (ZOLOFT) 25 MG tablet, Take 25 mg by mouth daily. .  silver sulfADIAZINE (SILVADENE) 1 % cream, Apply 1 application topically daily. .  traZODone (DESYREL) 50 MG tablet, TAKE 1/2 TO 1 (ONE-HALF TO ONE) TABLET BY MOUTH AT BEDTIME AS NEEDED FOR SLEEP .  TURMERIC PO, Take by mouth daily. Marland Kitchen  VALERIAN ROOT PO, Take 1,500 mg by mouth at bedtime. .  Zinc 50 MG TABS, Take 1 tablet by mouth every morning.     Reviewed prior external information including notes and imaging from  primary care provider As well as notes that were available from care everywhere and other healthcare systems.  Past medical history, social, surgical and family history all reviewed in electronic medical record.  Adrian [name] pertanent information unless stated regarding to the chief complaint.   Review of Systems:  Adrian [name] headache, visual changes, nausea, vomiting, diarrhea, constipation, dizziness, abdominal pain, skin rash, fevers, chills, night sweats, weight loss, swollen lymph nodes, body aches, joint swelling, chest pain, shortness of breath, mood  changes. POSITIVE muscle aches  Objective  Blood pressure 106/82, pulse (!) 50, height 5\' 10"  (1.778 m), weight 206 lb (93.4 kg), SpO2 97 %.   General: Adrian [name] apparent distress alert and oriented x3 mood and affect normal, dressed appropriately.  Masked facies noted HEENT: Pupils equal, extraocular movements intact  Respiratory: Patient's speak in full sentences and does not appear short of breath  Cardiovascular: Adrian [name] lower extremity edema, non tender, Adrian [name] erythema  Gait slow with a shuffling gait MSK: Resting tremor noted of the left upper extremity left hand though strength is completely normal at this time.  Mild cogwheeling with range of motion. Right hand shows the patient does have a positive grind test of the [facility] joint.  Adrian [name] significant though thenar eminence wasting.  Procedure: Real-time Ultrasound Guided Injection of right CMC joint Device: GE Logiq Q7 Ultrasound guided injection is preferred based studies that show increased duration, increased effect, greater accuracy, decreased procedural pain, increased response rate, and decreased cost with ultrasound guided versus blind injection.  Verbal informed consent obtained.  Time-out conducted.  Noted Adrian [name] overlying erythema, induration, or other signs of local infection.  Skin prepped in a sterile fashion.  Local anesthesia: Topical Ethyl chloride.  With sterile technique and under real time ultrasound guidance: With a 25-gauge half inch needle injected into the right [facility] joint with a total of 0.5 cc of 0.5% Marcaine and 0.5 cc of Kenalog 40 mg/mL.  Injected the overlying cyst that was noted as well. Completed without difficulty  Pain immediately resolved suggesting accurate placement of the medication.  Advised to call if fevers/chills, erythema, induration, drainage, or persistent bleeding.  Impression: Technically successful ultrasound guided injection.   Impression and Recommendations:     The above documentation has been reviewed  and is accurate and complete Lyndal Pulley, DO       Note: This dictation was prepared with Dragon dictation along with smaller phrase technology. Any transcriptional errors that result from this process are unintentional.

## 2020-02-28 ENCOUNTER — Encounter: Payer: Self-pay | Admitting: Family Medicine

## 2020-02-28 ENCOUNTER — Other Ambulatory Visit: Payer: Self-pay

## 2020-02-28 ENCOUNTER — Ambulatory Visit (INDEPENDENT_AMBULATORY_CARE_PROVIDER_SITE_OTHER): Payer: Medicare Other | Admitting: Family Medicine

## 2020-02-28 ENCOUNTER — Ambulatory Visit: Payer: Self-pay

## 2020-02-28 VITALS — BP 106/82 | HR 50 | Ht 70.0 in | Wt 206.0 lb

## 2020-02-28 DIAGNOSIS — R29898 Other symptoms and signs involving the musculoskeletal system: Secondary | ICD-10-CM | POA: Diagnosis not present

## 2020-02-28 DIAGNOSIS — M79641 Pain in right hand: Secondary | ICD-10-CM

## 2020-02-28 DIAGNOSIS — M19049 Primary osteoarthritis, unspecified hand: Secondary | ICD-10-CM | POA: Diagnosis not present

## 2020-02-28 NOTE — Assessment & Plan Note (Signed)
Repeat injection given today, tolerated the procedure well, discussed icing regimen and home exercises, discussed avoiding certain activities, increase activity slowly.  Follow-up again in 6 to 8 weeks if no improvement.  Patient does have a brace.  Underlying Parkinson's also likely contributing.

## 2020-02-28 NOTE — Assessment & Plan Note (Signed)
Completely resolved at this time.  Do feel it is likely secondary to the handlebar of his bike.  Follow-up as needed

## 2020-04-02 ENCOUNTER — Ambulatory Visit: Payer: Medicare Other

## 2020-04-22 DIAGNOSIS — H524 Presbyopia: Secondary | ICD-10-CM | POA: Diagnosis not present

## 2020-04-22 DIAGNOSIS — Z961 Presence of intraocular lens: Secondary | ICD-10-CM | POA: Diagnosis not present

## 2020-04-22 DIAGNOSIS — H5202 Hypermetropia, left eye: Secondary | ICD-10-CM | POA: Diagnosis not present

## 2020-04-22 DIAGNOSIS — Z9889 Other specified postprocedural states: Secondary | ICD-10-CM | POA: Diagnosis not present

## 2020-04-22 DIAGNOSIS — H43393 Other vitreous opacities, bilateral: Secondary | ICD-10-CM | POA: Diagnosis not present

## 2020-05-12 ENCOUNTER — Other Ambulatory Visit: Payer: Self-pay | Admitting: *Deleted

## 2020-05-12 MED ORDER — ROSUVASTATIN CALCIUM 10 MG PO TABS: 10.0000 mg | ORAL_TABLET | Freq: Every day | ORAL | 0 refills | Status: DC

## 2020-05-21 ENCOUNTER — Telehealth: Payer: Self-pay | Admitting: Cardiovascular Disease

## 2020-05-21 ENCOUNTER — Encounter: Payer: Self-pay | Admitting: Cardiovascular Disease

## 2020-05-21 ENCOUNTER — Other Ambulatory Visit: Payer: Self-pay | Admitting: *Deleted

## 2020-05-21 DIAGNOSIS — I7121 Aneurysm of the ascending aorta, without rupture: Secondary | ICD-10-CM

## 2020-05-21 NOTE — Telephone Encounter (Signed)
Returned the call to the patient. He was advised that a MyChart message was not received.   He was calling to get his GXT and MR Angio scheduled that is due in February 2022. Message has been sent to scheduling. Patient has been made aware that someone will be reaching out to him.

## 2020-05-21 NOTE — Telephone Encounter (Signed)
New message:     Patient sent a message threw mychart and asking to get a order for MRA treadmill stress test and asking for some to call him concering the order.

## 2020-05-21 NOTE — Telephone Encounter (Signed)
Spoke with patient regarding COVID screening appointment scheduled Friday 06/13/20 at 10:45 am----[address]----ETT scheduled Tuesday 06/17/20 at 9:45 am here at [facility]----MRA chest scheduled 06/18/20 at 11:00 am at [facility]---arrival time is 10:30 am---1st floor radiology----Patient to come in 06/11/20 for labwork (BMET) to check kidney function for the MRA chest.  Will mail all information to patient and it is also in My Chart---patient voiced his understanding.

## 2020-05-27 ENCOUNTER — Other Ambulatory Visit: Payer: Self-pay | Admitting: Internal Medicine

## 2020-06-10 ENCOUNTER — Other Ambulatory Visit: Payer: Self-pay | Admitting: Internal Medicine

## 2020-06-11 ENCOUNTER — Ambulatory Visit: Payer: Medicare Other | Admitting: Internal Medicine

## 2020-06-11 ENCOUNTER — Other Ambulatory Visit: Payer: Self-pay

## 2020-06-11 DIAGNOSIS — I712 Thoracic aortic aneurysm, without rupture: Secondary | ICD-10-CM

## 2020-06-11 DIAGNOSIS — I7121 Aneurysm of the ascending aorta, without rupture: Secondary | ICD-10-CM

## 2020-06-11 LAB — BASIC METABOLIC PANEL
BUN/Creatinine Ratio: 17 (ref 10–24)
BUN: 23 mg/dL (ref 8–27)
CO2: 25 mmol/L (ref 20–29)
Calcium: 9.8 mg/dL (ref 8.6–10.2)
Chloride: 101 mmol/L (ref 96–106)
Creatinine, Ser: 1.33 mg/dL — ABNORMAL HIGH (ref 0.76–1.27)
GFR calc Af Amer: 63 mL/min/{1.73_m2} (ref >59)
GFR calc non Af Amer: 54 mL/min/{1.73_m2} — ABNORMAL LOW (ref >59)
Glucose: 89 mg/dL (ref 65–99)
Potassium: 4.6 mmol/L (ref 3.5–5.2)
Sodium: 139 mmol/L (ref 134–144)

## 2020-06-12 ENCOUNTER — Ambulatory Visit: Payer: Medicare Other | Admitting: Podiatry

## 2020-06-12 ENCOUNTER — Ambulatory Visit: Payer: Medicare Other | Admitting: Internal Medicine

## 2020-06-13 ENCOUNTER — Telehealth: Payer: Self-pay | Admitting: *Deleted

## 2020-06-13 ENCOUNTER — Other Ambulatory Visit (HOSPITAL_COMMUNITY)
Admission: RE | Admit: 2020-06-13 | Discharge: 2020-06-13 | Disposition: A | Discharge status: Home / Self Care | Payer: Medicare Other | Source: Ambulatory Visit | Attending: Cardiovascular Disease | Admitting: Cardiovascular Disease

## 2020-06-13 ENCOUNTER — Telehealth (HOSPITAL_COMMUNITY): Payer: Self-pay | Admitting: *Deleted

## 2020-06-13 DIAGNOSIS — Z20822 Contact with and (suspected) exposure to covid-19: Secondary | ICD-10-CM | POA: Diagnosis not present

## 2020-06-13 DIAGNOSIS — I2510 Atherosclerotic heart disease of native coronary artery without angina pectoris: Secondary | ICD-10-CM

## 2020-06-13 DIAGNOSIS — Z01812 Encounter for preprocedural laboratory examination: Secondary | ICD-10-CM | POA: Insufficient documentation

## 2020-06-13 LAB — SARS CORONAVIRUS 2 (TAT 6-24 HRS): SARS Coronavirus 2: NEGATIVE

## 2020-06-13 NOTE — Telephone Encounter (Signed)
Close encounter 

## 2020-06-13 NOTE — Telephone Encounter (Signed)
See note o2 /26/2021

## 2020-06-17 ENCOUNTER — Ambulatory Visit (HOSPITAL_COMMUNITY)
Admission: RE | Admit: 2020-06-17 | Payer: Medicare Other | Source: Ambulatory Visit | Attending: Cardiovascular Disease | Admitting: Cardiovascular Disease

## 2020-06-18 ENCOUNTER — Other Ambulatory Visit: Payer: Self-pay

## 2020-06-18 ENCOUNTER — Ambulatory Visit (HOSPITAL_BASED_OUTPATIENT_CLINIC_OR_DEPARTMENT_OTHER)
Admission: RE | Admit: 2020-06-18 | Discharge: 2020-06-18 | Disposition: A | Discharge status: Home / Self Care | Payer: Medicare Other | Source: Ambulatory Visit | Attending: Cardiovascular Disease | Admitting: Cardiovascular Disease

## 2020-06-18 ENCOUNTER — Ambulatory Visit (HOSPITAL_COMMUNITY)
Admission: RE | Admit: 2020-06-18 | Discharge: 2020-06-18 | Disposition: A | Discharge status: Home / Self Care | Payer: Medicare Other | Source: Ambulatory Visit | Attending: Cardiovascular Disease | Admitting: Cardiovascular Disease

## 2020-06-18 DIAGNOSIS — I2583 Coronary atherosclerosis due to lipid rich plaque: Secondary | ICD-10-CM

## 2020-06-18 DIAGNOSIS — I2510 Atherosclerotic heart disease of native coronary artery without angina pectoris: Secondary | ICD-10-CM

## 2020-06-18 DIAGNOSIS — I7121 Aneurysm of the ascending aorta, without rupture: Secondary | ICD-10-CM

## 2020-06-18 DIAGNOSIS — I712 Thoracic aortic aneurysm, without rupture: Secondary | ICD-10-CM | POA: Diagnosis not present

## 2020-06-18 LAB — EXERCISE TOLERANCE TEST
Estimated workload: 7 METS
Exercise duration (min): 6 min
Exercise duration (sec): 20 s
MPHR: 151 {beats}/min
Peak HR: 127 {beats}/min
Percent HR: 84 %
Rest HR: 58 {beats}/min

## 2020-06-18 MED ORDER — GADOBUTROL 1 MMOL/ML IV SOLN
10.0000 mL | Freq: Once | INTRAVENOUS | Status: AC | PRN
  Administered 2020-06-18: 10 mL via INTRAVENOUS

## 2020-07-08 ENCOUNTER — Encounter (HOSPITAL_COMMUNITY): Payer: Medicare Other

## 2020-07-08 NOTE — Progress Notes (Signed)
[name] [place] Phone: [phone] Subjective:   Adrian [name], am serving as a scribe for Dr. Hulan Saas.  This visit occurred during the SARS-CoV-2 public health emergency.  Safety protocols were in place, including screening questions prior to the visit, additional usage of staff PPE, and extensive cleaning of exam room while observing appropriate contact time as indicated for disinfecting solutions.   I'm seeing this patient by the request  of:  Plotnikov, Evie Lacks, MD  CC: Right thumb pain  POE:[id number]   02/28/2020 Repeat injection given today, tolerated the procedure well, discussed icing regimen and home exercises, discussed avoiding certain activities, increase activity slowly.  Follow-up again in 6 to 8 weeks if no improvement.  Patient does have a brace.  Underlying Parkinson's also likely contributing.  Completely resolved at this time.  Do feel it is likely secondary to the handlebar of his bike.  Follow-up as needed  Update 07/09/2020 Adrian [name] is a 70 y.o. male coming in with complaint of right [facility] joint arthritis. Injection given last visit. Patient states thumb started hurting about a month ago and is now ready for injection.  Patient has had known arthritic changes of this.  Patient does have underlying Parkinson's and is starting to have increasing left hand weakness and increasing tremor.  Patient has to use his hand on a more regular basis.  Feels that that has exacerbated it somewhat.      Past Medical History:  Diagnosis Date  . Allergic rhinitis   . Cancer ([place]) 2016   basal cell skin cancer nose  . Cataract   . GERD (gastroesophageal reflux disease)   . Hyperlipidemia   . Hypertension   . Hypothyroidism 2010   mild  . Hypothyroidism   . Trigeminal neuralgia    L.  . Vitamin B12 deficiency 2010   Past Surgical History:  Procedure Laterality Date  . ARTHRODESIS  METATARSALPHALANGEAL JOINT (MTPJ) Right 07/27/2018   Procedure: Right hallux metatarsal phalangeal joint arthrodesis;  Surgeon: Wylene Simmer, MD;  Location: [facility];  Service: Orthopedics;  Laterality: Right;  . arthroscopic surgery for chondromalacia Left 1995  . CATARACT EXTRACTION W/ INTRAOCULAR LENS  IMPLANT, BILATERAL  2017  . COLONOSCOPY    . gamma knife surgery for trigeminal neuralgia  2005  . inguinal herniorrhahpy  1991   L.  Marland Kitchen POLYPECTOMY    . TONSILLECTOMY  1959  . TRIGEMINAL NERVE DECOMPRESSION  2008   Left  Dr. Claybon Jabs   Social History   Socioeconomic History  . Marital status: Married    Spouse name: Not on file  . Number of children: 1  . Years of education: Not on file  . Highest education level: Bachelor's degree (e.g., BA, AB, BS)  Occupational History  . 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 Smoking status: Former Smoker    Quit date: 05/31/1992    Years since quitting: 28.1  . Smokeless tobacco: Never Used  Vaping Use  . Vaping Use: Never used  Substance and Sexual Activity  . Alcohol use: Yes    Alcohol/week: 7.0 standard drinks    Types: 7 Glasses of wine per week    Comment: at least one drink per night; sometimes "we split a bottle of wine"  . Drug use: No  . Sexual activity: Yes  Other Topics Concern  . Not on file  Social History  Narrative   [employer]   Married x2: 12 years 60st; 15 years ('53) 2nd   One daughter, step daugfhter, step son   Laverle Hobby to bike   Social Determinants of Radio broadcast assistant Strain: Not on file  Food Insecurity: Not on file  Transportation Needs: Not on file  Physical Activity: Not on file  Stress: Not on file  Social Connections: Not on file   Allergies  Allergen Reactions  . Tape Rash    Irritation  . Elavil [Amitriptyline Hcl]     constipation  . Neomy-Bacit-Polymyx-Pramoxine Rash    Itching   . Other Rash  . Triple Antibiotic  [Bacitracin-Neomycin-Polymyxin] Rash   Family History  Problem Relation Age of Onset  . Heart disease Mother 59       MI  . Diabetes Mother   . Prostate cancer Father 75       prostate  . Breast cancer Sister 9       breast ca  . Stroke Brother   . Colon cancer Neg Hx   . Esophageal cancer Neg Hx   . Rectal cancer Neg Hx   . Stomach cancer Neg Hx     Current Outpatient Medications (Endocrine & Metabolic):  .  levothyroxine (SYNTHROID) 75 MCG tablet, Take 1 tablet (75 mcg total) by mouth daily.  Current Outpatient Medications (Cardiovascular):  .  losartan (COZAAR) 100 MG tablet, Take 1 tablet (100 mg total) by mouth daily. .  rosuvastatin (CRESTOR) 10 MG tablet, Take 1 tablet (10 mg total) by mouth daily. Annual appt is due w/labs must see provider for future refills .  sildenafil (VIAGRA) 100 MG tablet, Take 0.5-1 tablets (50-100 mg total) by mouth daily as needed for erectile dysfunction.  Current Outpatient Medications (Respiratory):  Marland Kitchen  Cetirizine HCl (ZYRTEC ALLERGY) 10 MG CAPS,  .  diphenhydrAMINE HCl (BENADRYL ALLERGY PO),  .  fluticasone (FLONASE) 50 MCG/ACT nasal spray, Place into both nostrils daily.  Current Outpatient Medications (Analgesics):  .  aspirin EC 81 MG tablet, Take 1 tablet (81 mg total) by mouth daily.   Current Outpatient Medications (Other):  .  carbidopa-levodopa (SINEMET IR) 25-100 MG tablet, Take 1 tablet by mouth 3 (three) times daily. (Patient taking differently: Take 1 tablet by mouth 4 (four) times daily.) .  gabapentin (NEURONTIN) 100 MG capsule, Take 2 capsules (200 mg total) by mouth at bedtime. .  halobetasol (ULTRAVATE) 0.05 % cream, APPLY CREAM TOPICALLY 2 TIMES DAILY AS NEEDED .  Misc Natural Products (TART CHERRY ADVANCED) CAPS, Take by mouth daily. .  Multiple Vitamins-Minerals (CENTRUM SILVER ADULT 50+ PO), Take 1 tablet by mouth at bedtime. Marland Kitchen  ofloxacin (OCUFLOX) 0.3 % ophthalmic solution, Place 1 drop into the right eye 3 (three)  times daily. .  Probiotic Product (PROBIOTIC DAILY PO), Take 1 tablet by mouth at bedtime. .  sertraline (ZOLOFT) 25 MG tablet, Take 25 mg by mouth daily. .  silver sulfADIAZINE (SILVADENE) 1 % cream, Apply 1 application topically daily. .  traZODone (DESYREL) 50 MG tablet, TAKE 1/2 TO 1 (ONE-HALF TO ONE) TABLET BY MOUTH AT BEDTIME AS NEEDED FOR SLEEP .  TURMERIC PO, Take by mouth daily. Marland Kitchen  VALERIAN ROOT PO, Take 1,500 mg by mouth at bedtime. .  Zinc 50 MG TABS, Take 1 tablet by mouth every morning.   Reviewed prior external information including notes and imaging from  primary care provider As well as notes that were available from care everywhere and other healthcare systems.  Past medical history, social, surgical and family history all reviewed in electronic medical record.  No pertanent information unless stated regarding to the chief complaint.   Review of Systems:  No headache, visual changes, nausea, vomiting, diarrhea, constipation, dizziness, abdominal pain, skin rash, fevers, chills, night sweats, weight loss, swollen lymph nodes, body aches, joint swelling, chest pain, shortness of breath, mood changes. POSITIVE muscle aches  Objective  Blood pressure 120/76, pulse (!) 52, height 5\' 10"  (1.778 m), weight 214 lb (97.1 kg), SpO2 96 %.   General: No apparent distress alert and oriented x3 mood and affect normal, dressed appropriately.  HEENT: Pupils equal, extraocular movements intact  Respiratory: Patient's speak in full sentences and does not appear short of breath  Cardiovascular: No lower extremity edema, non tender, no erythema  Gait normal with good balance and coordination.  Left hand exam shows the patient does have some arthritic changes noted.  Patient does have a resting tremor of the left hand noted.  He does have some mild weakness with grip strength. Right hand exam shows positive grind of the right CMC joint with pain, moderate arthritic changes.    Procedure:  Real-time Ultrasound Guided Injection of right CMC joint Device: GE Logiq Q7 Ultrasound guided injection is preferred based studies that show increased duration, increased effect, greater accuracy, decreased procedural pain, increased response rate, and decreased cost with ultrasound guided versus blind injection.  Verbal informed consent obtained.  Time-out conducted.  Noted no overlying erythema, induration, or other signs of local infection.  Skin prepped in a sterile fashion.  Local anesthesia: Topical Ethyl chloride.  With sterile technique and under real time ultrasound guidance: With a 25-gauge half inch needle injected with 0.5 cc of 0.5% Marcaine and 0.5 cc of Kenalog 40 mg/mL. Patient did have a mild overlying cyst noted in this area.  Seems to be fluid-filled. Completed without difficulty  Pain immediately resolved suggesting accurate placement of the medication.  Advised to call if fevers/chills, erythema, induration, drainage, or persistent bleeding.  Impression: Technically successful ultrasound guided injection. Impression and Recommendations:     The above documentation has been reviewed and is accurate and complete Lyndal Pulley, DO

## 2020-07-09 ENCOUNTER — Encounter: Payer: Self-pay | Admitting: Family Medicine

## 2020-07-09 ENCOUNTER — Ambulatory Visit: Payer: Medicare Other | Admitting: Family Medicine

## 2020-07-09 ENCOUNTER — Other Ambulatory Visit: Payer: Self-pay

## 2020-07-09 ENCOUNTER — Ambulatory Visit (INDEPENDENT_AMBULATORY_CARE_PROVIDER_SITE_OTHER): Payer: Medicare Other

## 2020-07-09 ENCOUNTER — Ambulatory Visit: Payer: Self-pay

## 2020-07-09 VITALS — BP 120/76 | HR 52 | Ht 70.0 in | Wt 214.0 lb

## 2020-07-09 DIAGNOSIS — R29898 Other symptoms and signs involving the musculoskeletal system: Secondary | ICD-10-CM | POA: Diagnosis not present

## 2020-07-09 DIAGNOSIS — M79641 Pain in right hand: Secondary | ICD-10-CM | POA: Diagnosis not present

## 2020-07-09 DIAGNOSIS — M19049 Primary osteoarthritis, unspecified hand: Secondary | ICD-10-CM

## 2020-07-09 DIAGNOSIS — G20 Parkinson's disease: Secondary | ICD-10-CM

## 2020-07-09 DIAGNOSIS — M19041 Primary osteoarthritis, right hand: Secondary | ICD-10-CM | POA: Diagnosis not present

## 2020-07-09 NOTE — Patient Instructions (Addendum)
Good to see you Injection given today Xray on your way out Depending on xray may change our management  See me again in 3 months

## 2020-07-09 NOTE — Assessment & Plan Note (Signed)
Patient does have more of a Parkinson's disease.  Does have some increasing resting tremor of the left upper extremity.  Patient is following up with a specialist.

## 2020-07-09 NOTE — Assessment & Plan Note (Signed)
Patient is having more of a left-handed weakness at this point.  Need to consider the possibility of a nerve conduction study.  Could be secondary to the Parkinson's but may be consider the possibility of carpal tunnel as well we will discuss at follow-up.

## 2020-07-09 NOTE — Assessment & Plan Note (Signed)
Repeat injection given again today.  Tolerated the procedure well.  Patient did have a very small cyst in the area noted today that could be also contributing.  We discussed getting a new x-ray.  If findings severe arthritic changes we do need to consider the possibility of surgical intervention for replacement.  If only mild can consider the possibility of MRI to further evaluate.  Patient has responded fairly well to these injections and we will continue to.  Patient will follow up with me again in 3 months otherwise.

## 2020-07-13 ENCOUNTER — Other Ambulatory Visit: Payer: Self-pay | Admitting: Internal Medicine

## 2020-07-14 ENCOUNTER — Encounter: Payer: Self-pay | Admitting: Internal Medicine

## 2020-07-14 ENCOUNTER — Other Ambulatory Visit: Payer: Self-pay

## 2020-07-14 ENCOUNTER — Ambulatory Visit (INDEPENDENT_AMBULATORY_CARE_PROVIDER_SITE_OTHER): Payer: Medicare Other | Admitting: Internal Medicine

## 2020-07-14 VITALS — BP 130/82 | HR 50 | Temp 98.5°F | Ht 70.0 in | Wt 206.8 lb

## 2020-07-14 DIAGNOSIS — N183 Chronic kidney disease, stage 3 unspecified: Secondary | ICD-10-CM

## 2020-07-14 DIAGNOSIS — I712 Thoracic aortic aneurysm, without rupture: Secondary | ICD-10-CM

## 2020-07-14 DIAGNOSIS — N320 Bladder-neck obstruction: Secondary | ICD-10-CM

## 2020-07-14 DIAGNOSIS — Z0000 Encounter for general adult medical examination without abnormal findings: Secondary | ICD-10-CM

## 2020-07-14 DIAGNOSIS — E034 Atrophy of thyroid (acquired): Secondary | ICD-10-CM

## 2020-07-14 DIAGNOSIS — M79644 Pain in right finger(s): Secondary | ICD-10-CM

## 2020-07-14 DIAGNOSIS — I7121 Aneurysm of the ascending aorta, without rupture: Secondary | ICD-10-CM

## 2020-07-14 DIAGNOSIS — G20 Parkinson's disease: Secondary | ICD-10-CM

## 2020-07-14 LAB — PSA: PSA: 0.96 ng/mL (ref 0.10–4.00)

## 2020-07-14 LAB — CBC WITH DIFFERENTIAL/PLATELET
Basophils Absolute: 0 10*3/uL (ref 0.0–0.1)
Basophils Relative: 0.6 % (ref 0.0–3.0)
Eosinophils Absolute: 0.2 10*3/uL (ref 0.0–0.7)
Eosinophils Relative: 4.5 % (ref 0.0–5.0)
HCT: 41.6 % (ref 39.0–52.0)
Hemoglobin: 14 g/dL (ref 13.0–17.0)
Lymphocytes Relative: 35.8 % (ref 12.0–46.0)
Lymphs Abs: 1.7 10*3/uL (ref 0.7–4.0)
MCHC: 33.6 g/dL (ref 30.0–36.0)
MCV: 90.4 fl (ref 78.0–100.0)
Monocytes Absolute: 0.5 10*3/uL (ref 0.1–1.0)
Monocytes Relative: 9.8 % (ref 3.0–12.0)
Neutro Abs: 2.4 10*3/uL (ref 1.4–7.7)
Neutrophils Relative %: 49.3 % (ref 43.0–77.0)
Platelets: 127 10*3/uL — ABNORMAL LOW (ref 150.0–400.0)
RBC: 4.6 Mil/uL (ref 4.22–5.81)
RDW: 14.2 % (ref 11.5–15.5)
WBC: 4.9 10*3/uL (ref 4.0–10.5)

## 2020-07-14 LAB — URINALYSIS
Bilirubin Urine: NEGATIVE
Hgb urine dipstick: NEGATIVE
Ketones, ur: NEGATIVE
Leukocytes,Ua: NEGATIVE
Nitrite: NEGATIVE
Specific Gravity, Urine: 1.025 (ref 1.000–1.030)
Total Protein, Urine: NEGATIVE
Urine Glucose: NEGATIVE
Urobilinogen, UA: 0.2 (ref 0.0–1.0)
pH: 6 (ref 5.0–8.0)

## 2020-07-14 LAB — COMPREHENSIVE METABOLIC PANEL
ALT: 6 U/L (ref 0–53)
AST: 12 U/L (ref 0–37)
Albumin: 4.2 g/dL (ref 3.5–5.2)
Alkaline Phosphatase: 46 U/L (ref 39–117)
BUN: 24 mg/dL — ABNORMAL HIGH (ref 6–23)
CO2: 29 mEq/L (ref 19–32)
Calcium: 9.3 mg/dL (ref 8.4–10.5)
Chloride: 105 mEq/L (ref 96–112)
Creatinine, Ser: 1.37 mg/dL (ref 0.40–1.50)
GFR: 52.63 mL/min — ABNORMAL LOW (ref >60.00)
Glucose, Bld: 104 mg/dL — ABNORMAL HIGH (ref 70–99)
Potassium: 4.3 mEq/L (ref 3.5–5.1)
Sodium: 140 mEq/L (ref 135–145)
Total Bilirubin: 0.5 mg/dL (ref 0.2–1.2)
Total Protein: 6.7 g/dL (ref 6.0–8.3)

## 2020-07-14 LAB — TSH: TSH: 3.47 u[IU]/mL (ref 0.35–4.50)

## 2020-07-14 LAB — LIPID PANEL
Cholesterol: 108 mg/dL (ref 0–200)
HDL: 56.9 mg/dL (ref >39.00)
LDL Cholesterol: 39 mg/dL (ref 0–99)
NonHDL: 51.54
Total CHOL/HDL Ratio: 2
Triglycerides: 65 mg/dL (ref 0.0–149.0)
VLDL: 13 mg/dL (ref 0.0–40.0)

## 2020-07-14 MED ORDER — LEVOTHYROXINE SODIUM 75 MCG PO TABS: 75.0000 ug | ORAL_TABLET | Freq: Every day | ORAL | 3 refills | Status: DC

## 2020-07-14 MED ORDER — ROSUVASTATIN CALCIUM 10 MG PO TABS
10.0000 mg | ORAL_TABLET | Freq: Every day | ORAL | 3 refills | Status: DC
Start: 2020-07-14 — End: 2021-07-07

## 2020-07-14 MED ORDER — TRAZODONE HCL 50 MG PO TABS: ORAL_TABLET | ORAL | 1 refills | Status: DC

## 2020-07-14 MED ORDER — SERTRALINE HCL 25 MG PO TABS: 25.0000 mg | ORAL_TABLET | Freq: Every day | ORAL | 1 refills | Status: DC

## 2020-07-14 MED ORDER — GABAPENTIN 100 MG PO CAPS: 200.0000 mg | ORAL_CAPSULE | Freq: Every day | ORAL | 3 refills | Status: DC

## 2020-07-14 MED ORDER — CARBIDOPA-LEVODOPA 25-100 MG PO TABS: 1.0000 | ORAL_TABLET | Freq: Four times a day (QID) | ORAL | 3 refills | Status: DC

## 2020-07-14 MED ORDER — SILDENAFIL CITRATE 100 MG PO TABS: 50.0000 mg | ORAL_TABLET | Freq: Every day | ORAL | 5 refills | Status: DC | PRN

## 2020-07-14 MED ORDER — LOSARTAN POTASSIUM 100 MG PO TABS: 100.0000 mg | ORAL_TABLET | Freq: Every day | ORAL | 3 refills | Status: DC

## 2020-07-14 MED ORDER — METHYLPREDNISOLONE 4 MG PO TBPK
ORAL_TABLET | ORAL | 0 refills | Status: DC
Start: 2020-07-14 — End: 2021-04-03

## 2020-07-14 NOTE — Assessment & Plan Note (Signed)
F/u w/dr Justin Mend

## 2020-07-14 NOTE — Assessment & Plan Note (Addendum)
  We discussed age appropriate health related issues, including available/recomended screening tests and vaccinations. Labs were ordered to be later reviewed . All questions were answered. We discussed one or more of the following - seat belt use, use of sunscreen/sun exposure exercise, safe sex, fall risk reduction, second hand smoke exposure, firearm use and storage, seat belt use, a need for adhering to healthy diet and exercise. Labs were ordered.  All questions were answered.  Colon 2017 2/20  cardiac CT scan for calcium score is 631

## 2020-07-14 NOTE — Assessment & Plan Note (Signed)
On Levothyroxine 

## 2020-07-14 NOTE — Progress Notes (Signed)
Subjective:  Patient ID: Adrian [name], male    DOB: [birth year]  Age: 70 y.o. MRN: [id number]  CC: Follow-up (6 month f/u- Req refills on all medications)   HPI Adrian [name] presents for a well exam F/u on Parkinson's, OA,   Outpatient Medications Prior to Visit  Medication Sig Dispense Refill  . carbidopa-levodopa (SINEMET IR) 25-100 MG tablet Take 1 tablet by mouth 3 (three) times daily. (Patient taking differently: Take 1 tablet by mouth 4 (four) times daily.) 270 tablet 1  . Cetirizine HCl (ZYRTEC ALLERGY) 10 MG CAPS     . diphenhydrAMINE HCl (BENADRYL ALLERGY PO)     . fluticasone (FLONASE) 50 MCG/ACT nasal spray Place into both nostrils daily.    Marland Kitchen gabapentin (NEURONTIN) 100 MG capsule Take 2 capsules (200 mg total) by mouth at bedtime. 180 capsule 3  . halobetasol (ULTRAVATE) 0.05 % cream APPLY CREAM TOPICALLY 2 TIMES DAILY AS NEEDED 45 g 1  . levothyroxine (SYNTHROID) 75 MCG tablet Take 1 tablet (75 mcg total) by mouth daily. 90 tablet 3  . losartan (COZAAR) 100 MG tablet Take 1 tablet (100 mg total) by mouth daily. 90 tablet 3  . Misc Natural Products (TART CHERRY ADVANCED) CAPS Take by mouth daily.    . Multiple Vitamins-Minerals (CENTRUM SILVER ADULT 50+ PO) Take 1 tablet by mouth at bedtime.    Marland Kitchen ofloxacin (OCUFLOX) 0.3 % ophthalmic solution Place 1 drop into the right eye 3 (three) times daily.    . Probiotic Product (PROBIOTIC DAILY PO) Take 1 tablet by mouth at bedtime.    . rosuvastatin (CRESTOR) 10 MG tablet Take 1 tablet (10 mg total) by mouth daily. Overdue for Annual appt w/labs must see provider for future refills 30 tablet 0  . sertraline (ZOLOFT) 25 MG tablet Take 25 mg by mouth daily.    . sildenafil (VIAGRA) 100 MG tablet Take 0.5-1 tablets (50-100 mg total) by mouth daily as needed for erectile dysfunction. 12 tablet 5  . traZODone (DESYREL) 50 MG tablet TAKE 1/2 TO 1 (ONE-HALF TO ONE) TABLET BY MOUTH AT BEDTIME AS NEEDED FOR SLEEP 90 tablet 3  . TURMERIC  PO Take by mouth daily.    Marland Kitchen VALERIAN ROOT PO Take 1,500 mg by mouth at bedtime.    . Zinc 50 MG TABS Take 1 tablet by mouth every morning.    . silver sulfADIAZINE (SILVADENE) 1 % cream Apply 1 application topically daily. 50 g 0   No facility-administered medications prior to visit.    ROS: Review of Systems  Constitutional: Negative for appetite change, fatigue and unexpected weight change.  HENT: Negative for congestion, nosebleeds, sneezing, sore throat and trouble swallowing.   Eyes: Negative for itching and visual disturbance.  Respiratory: Negative for cough.   Cardiovascular: Negative for chest pain, palpitations and leg swelling.  Gastrointestinal: Negative for abdominal distention, blood in stool, diarrhea and nausea.  Genitourinary: Negative for frequency and hematuria.  Musculoskeletal: Positive for arthralgias and back pain. Negative for gait problem, joint swelling and neck pain.  Skin: Negative for rash.  Neurological: Positive for tremors. Negative for dizziness, speech difficulty and weakness.  Psychiatric/Behavioral: Negative for agitation, dysphoric mood and sleep disturbance. The patient is not nervous/anxious.     Objective:  BP 130/82 (BP Location: Left Arm)   Pulse (!) 50   Temp 98.5 F (36.9 C) (Oral)   Ht 5\' 10"  (1.778 m)   Wt 206 lb 12.8 oz (93.8 kg)   SpO2 97%  BMI 29.67 kg/m   BP Readings from Last 3 Encounters:  07/14/20 130/82  07/09/20 120/76  02/28/20 106/82    Wt Readings from Last 3 Encounters:  07/14/20 206 lb 12.8 oz (93.8 kg)  07/09/20 214 lb (97.1 kg)  02/28/20 206 lb (93.4 kg)    Physical Exam Constitutional:      General: He is not in acute distress.    Appearance: He is well-developed. He is obese.     Comments: NAD  HENT:     Mouth/Throat:     Mouth: Oropharynx is clear and moist.  Eyes:     Conjunctiva/sclera: Conjunctivae normal.     Pupils: Pupils are equal, round, and reactive to light.  Neck:     Thyroid: No  thyromegaly.     Vascular: No JVD.  Cardiovascular:     Rate and Rhythm: Normal rate and regular rhythm.     Pulses: Intact distal pulses.     Heart sounds: Normal heart sounds. No murmur heard. No friction rub. No gallop.   Pulmonary:     Effort: Pulmonary effort is normal. No respiratory distress.     Breath sounds: Normal breath sounds. No wheezing or rales.  Chest:     Chest wall: No tenderness.  Abdominal:     General: Bowel sounds are normal. There is no distension.     Palpations: Abdomen is soft. There is no mass.     Tenderness: There is no abdominal tenderness. There is no guarding or rebound.  Musculoskeletal:        General: Tenderness present. No edema. Normal range of motion.     Cervical back: Normal range of motion.  Lymphadenopathy:     Cervical: No cervical adenopathy.  Skin:    General: Skin is warm and dry.     Findings: No rash.  Neurological:     Mental Status: He is alert and oriented to person, place, and time.     Cranial Nerves: No cranial nerve deficit.     Motor: No abnormal muscle tone.     Coordination: He displays a negative Romberg sign. Coordination normal.     Gait: Gait normal.     Deep Tendon Reflexes: Reflexes are normal and symmetric.  Psychiatric:        Mood and Affect: Mood and affect normal.        Behavior: Behavior normal.        Thought Content: Thought content normal.        Judgment: Judgment normal.     Rectal - per Urology R thumb base - very tender   Lab Results  Component Value Date   WBC 4.8 06/20/2019   HGB 14.1 06/20/2019   HCT 41.7 06/20/2019   PLT 140.0 (L) 06/20/2019   GLUCOSE 89 06/11/2020   CHOL 143 06/20/2019   TRIG 128.0 06/20/2019   HDL 44.30 06/20/2019   LDLDIRECT 66.0 05/10/2016   LDLCALC 73 06/20/2019   ALT 8 06/20/2019   AST 15 06/20/2019   NA 139 06/11/2020   K 4.6 06/11/2020   CL 101 06/11/2020   CREATININE 1.33 (H) 06/11/2020   BUN 23 06/11/2020   CO2 25 06/11/2020   TSH 3.21 06/20/2019    PSA 1.14 06/20/2019   INR 1.0 RATIO 09/18/2007   HGBA1C 5.6 06/20/2019    MR Angiogram Chest W Wo Contrast  Result Date: 06/18/2020 CLINICAL DATA:  Mild aneurysmal dilatation of the ascending thoracic aorta detected by prior unenhanced coronary calcium score study.  EXAM: MRA CHEST WITH CONTRAST TECHNIQUE: Angiographic images of the chest were obtained using MRA technique with intravenous contrast. CONTRAST:  89mL GADAVIST GADOBUTROL 1 MMOL/ML IV SOLN COMPARISON:  Coronary calcium score CT on 07/10/2019 FINDINGS: VASCULAR Aorta: The aortic root measures approximately 3.9 cm at the level of the sinuses of Valsalva. The ascending thoracic aorta is mildly dilated and measures approximately 4.1 cm in maximum diameter. The aortic arch measures 3.1-3.2 cm. The descending thoracic aorta measures 2.8 cm. There is no evidence of aortic dissection. Visualized proximal great vessels demonstrate normal patency and normal branching anatomy. Heart: The heart size is within normal limits. No pericardial fluid identified. Pulmonary Arteries: Central pulmonary arteries are normal in caliber. NON-VASCULAR No pleural fluid or pulmonary abnormalities identified. No evidence of lymphadenopathy in the chest. Visualized upper abdomen demonstrates a cyst in the left lobe of the liver measuring up to 6 cm in maximum diameter. Visualized bony structures are unremarkable. 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 Recommend annual imaging followup by CTA or MRA. This recommendation follows 2010 ACCF/AHA/AATS/ACR/ASA/SCA/SCAI/SIR/STS/SVM Guidelines for the Diagnosis and Management of Patients with Thoracic Aortic Disease. Circulation. 2010; 121: Q206-O156. Aortic aneurysm NOS (ICD10-I71.9) Electronically Signed   By: Aletta Edouard M.D.   On: 06/18/2020 12:55   EXERCISE TOLERANCE TEST (ETT)  Result Date: 06/18/2020  There was no ST segment deviation  noted during stress.  No T wave inversion was noted during stress.  1. Negative ECG stress test for ischemia but only achieved 84% MPHR. Cannot exclude ischemia at higher heart rate. 2. Average exercise capacity (6:20 min:s; 7.0 METS). 3. Normal HR/BP response to exercise. 4. 84% MPHR achieved as noted above.    Assessment & Plan:    Walker Kehr, MD

## 2020-07-14 NOTE — Addendum Note (Signed)
Addended by: Boris Lown B on: 07/14/2020 11:28 AM   Modules accepted: Orders

## 2020-07-14 NOTE — Assessment & Plan Note (Signed)
On Sinemet SR

## 2020-07-14 NOTE — Assessment & Plan Note (Addendum)
MRI 2021: Mild aneurysmal disease of the ascending thoracic aorta measuring approximately 4.1 cm in maximum diameter. F/u Dr Sallyanne Kuster

## 2020-07-14 NOTE — Assessment & Plan Note (Addendum)
Worse Medrol dose pack po Voltaren gel

## 2020-07-14 NOTE — Patient Instructions (Addendum)
Voltaren gel  Riley Lam @ [place] [facility] Lunch: Thursday - Saturday 11:30 - 2:00 PM Dinner: Wednesday - Saturday 5:00 - 9:00 PM [address], [place], [place] [address] [phone]

## 2020-07-23 ENCOUNTER — Other Ambulatory Visit: Payer: Self-pay

## 2020-07-23 ENCOUNTER — Ambulatory Visit: Payer: Medicare Other

## 2020-07-25 DIAGNOSIS — G20 Parkinson's disease: Secondary | ICD-10-CM | POA: Diagnosis not present

## 2020-07-25 DIAGNOSIS — E785 Hyperlipidemia, unspecified: Secondary | ICD-10-CM | POA: Diagnosis not present

## 2020-07-25 DIAGNOSIS — N200 Calculus of kidney: Secondary | ICD-10-CM | POA: Diagnosis not present

## 2020-07-25 DIAGNOSIS — I129 Hypertensive chronic kidney disease with stage 1 through stage 4 chronic kidney disease, or unspecified chronic kidney disease: Secondary | ICD-10-CM | POA: Diagnosis not present

## 2020-07-25 DIAGNOSIS — N1831 Chronic kidney disease, stage 3a: Secondary | ICD-10-CM | POA: Diagnosis not present

## 2020-07-25 DIAGNOSIS — N261 Atrophy of kidney (terminal): Secondary | ICD-10-CM | POA: Diagnosis not present

## 2020-07-25 DIAGNOSIS — N1330 Unspecified hydronephrosis: Secondary | ICD-10-CM | POA: Diagnosis not present

## 2020-08-14 DIAGNOSIS — G20 Parkinson's disease: Secondary | ICD-10-CM | POA: Diagnosis not present

## 2020-08-14 DIAGNOSIS — F32A Depression, unspecified: Secondary | ICD-10-CM | POA: Diagnosis not present

## 2020-08-21 DIAGNOSIS — H903 Sensorineural hearing loss, bilateral: Secondary | ICD-10-CM | POA: Diagnosis not present

## 2020-08-29 ENCOUNTER — Other Ambulatory Visit: Payer: Self-pay

## 2020-08-29 ENCOUNTER — Ambulatory Visit: Payer: Medicare Other | Admitting: Cardiovascular Disease

## 2020-08-29 ENCOUNTER — Encounter: Payer: Self-pay | Admitting: Cardiovascular Disease

## 2020-08-29 VITALS — BP 118/80 | HR 56 | Ht 70.0 in | Wt 211.0 lb

## 2020-08-29 DIAGNOSIS — I10 Essential (primary) hypertension: Secondary | ICD-10-CM | POA: Diagnosis not present

## 2020-08-29 DIAGNOSIS — I712 Thoracic aortic aneurysm, without rupture: Secondary | ICD-10-CM | POA: Diagnosis not present

## 2020-08-29 DIAGNOSIS — I2510 Atherosclerotic heart disease of native coronary artery without angina pectoris: Secondary | ICD-10-CM | POA: Diagnosis not present

## 2020-08-29 DIAGNOSIS — I7121 Aneurysm of the ascending aorta, without rupture: Secondary | ICD-10-CM

## 2020-08-29 DIAGNOSIS — N1831 Chronic kidney disease, stage 3a: Secondary | ICD-10-CM | POA: Diagnosis not present

## 2020-08-29 DIAGNOSIS — E7800 Pure hypercholesterolemia, unspecified: Secondary | ICD-10-CM

## 2020-08-29 NOTE — Patient Instructions (Signed)
Medication Instructions:  No changes *If you need a refill on your cardiac medications before your next appointment, please call your pharmacy*   Lab Work: None ordered If you have labs (blood work) drawn today and your tests are completely normal, you will receive your results only by: Marland Kitchen MyChart Message (if you have MyChart) OR . A paper copy in the mail If you have any lab test that is abnormal or we need to change your treatment, we will call you to review the results.   Testing/Procedures: Dr. Sallyanne Kuster has ordered MRI examination of the chest to be completed January 2023    Follow-Up: At [facility], you and your health needs are our priority.  As part of our continuing mission to provide you with exceptional heart care, we have created designated Provider Care Teams.  These Care Teams include your primary Cardiologist (physician) and Advanced Practice Providers (APPs -  Physician Assistants and Nurse Practitioners) who all work together to provide you with the care you need, when you need it.  We recommend signing up for the patient portal called "MyChart".  Sign up information is provided on this After Visit Summary.  MyChart is used to connect with patients for Virtual Visits (Telemedicine).  Patients are able to view lab/test results, encounter notes, upcoming appointments, etc.  Non-urgent messages can be sent to your provider as well.   To learn more about what you can do with MyChart, go to [email or web address].    Your next appointment:   12 month(s)  The format for your next appointment:   In Person  Provider:   You may see Sanda Klein, MD or one of the following Advanced Practice Providers on your designated Care Team:    Almyra Deforest, PA-C  Fabian Sharp, PA-C or   Roby Lofts, [place]

## 2020-08-29 NOTE — Progress Notes (Signed)
Cardiology Office Note:    Date:  08/31/2020   ID:  Adrian [name], DOB 1950/12/03, MRN [id number]  PCP:  Cassandria Anger, MD  Cardiologist:  Sanda Klein, MD  Electrophysiologist:  None   Referring MD: Cassandria Anger, MD   No chief complaint on file. Adrian [name] is a 70 y.o. male who is being seen today for the evaluation of high coronary calcium score and ascending aortic dilation at the request of Plotnikov, Evie Lacks, MD.   History of Present Illness:    Adrian [name] is a 70 y.o. male with a hx of hypercholesterolemia, hypertension, family history of coronary artery disease, moderate chronic kidney disease, recently diagnosed Parkinson's disease, referred after a low-dose chest CT showed an elevated calcium score of 630 (80th percentile) and a mildly dilated ascending aorta at 4.2 cm.  His ECG stress test was normal and a more accurate measurement of his ascending aorta on MRI with contrast was a maximum of 4.1 cm.  He remains asymptomatic  Adrian [name] has no symptoms of coronary disease.  Every day he either rides his bike for 20 minutes or goes on a 2 mile walk.  He splits firewood with an axe and carries that wood into his home, without problems with shortness of breath or chest discomfort.  He does describe occasional palpitations are consistently associated with anxiety.  He does not have leg edema and denies intermittent claudication.  He has resting tremor a little worse in his left hand compared to the right that has been become steadily more noticeable over the last couple of years.  He does not have other focal neurological events and does not have a history of stroke or TIA.  Imaging studies have showed that he has an atrophied right kidney but no evidence of right renal artery stenosis.  He has mild left hydronephrosis.    His creatinine is 1.37, stable. Most recent LDL is only 39. BP is very well controlled now. He does not have diabetes mellitus.     Past  Medical History:  Diagnosis Date  . Allergic rhinitis   . Cancer ([place]) 2016   basal cell skin cancer nose  . Cataract   . GERD (gastroesophageal reflux disease)   . Hyperlipidemia   . Hypertension   . Hypothyroidism 2010   mild  . Hypothyroidism   . Trigeminal neuralgia    L.  . Vitamin B12 deficiency 2010    Past Surgical History:  Procedure Laterality Date  . ARTHRODESIS METATARSALPHALANGEAL JOINT (MTPJ) Right 07/27/2018   Procedure: Right hallux metatarsal phalangeal joint arthrodesis;  Surgeon: Wylene Simmer, MD;  Location: [facility];  Service: Orthopedics;  Laterality: Right;  . arthroscopic surgery for chondromalacia Left 1995  . CATARACT EXTRACTION W/ INTRAOCULAR LENS  IMPLANT, BILATERAL  2017  . COLONOSCOPY    . gamma knife surgery for trigeminal neuralgia  2005  . inguinal herniorrhahpy  1991   L.  Marland Kitchen POLYPECTOMY    . TONSILLECTOMY  1959  . TRIGEMINAL NERVE DECOMPRESSION  2008   Left  Dr. Claybon Jabs    Current Medications: Current Meds  Medication Sig  . carbidopa-levodopa (SINEMET IR) 25-100 MG tablet Take 1 tablet by mouth 4 (four) times daily.  . Cetirizine HCl (ZYRTEC ALLERGY) 10 MG CAPS   . diphenhydrAMINE HCl (BENADRYL ALLERGY PO)   . fluticasone (FLONASE) 50 MCG/ACT nasal spray Place into both nostrils daily.  Marland Kitchen gabapentin (NEURONTIN) 100 MG capsule Take 2 capsules (200 mg  total) by mouth at bedtime.  . halobetasol (ULTRAVATE) 0.05 % cream APPLY CREAM TOPICALLY 2 TIMES DAILY AS NEEDED  . levothyroxine (SYNTHROID) 75 MCG tablet Take 1 tablet (75 mcg total) by mouth daily.  Marland Kitchen losartan (COZAAR) 100 MG tablet Take 1 tablet (100 mg total) by mouth daily.  . methylPREDNISolone (MEDROL DOSEPAK) 4 MG TBPK tablet As directed  . Misc Natural Products (TART CHERRY ADVANCED) CAPS Take by mouth daily.  . Multiple Vitamins-Minerals (CENTRUM SILVER ADULT 50+ PO) Take 1 tablet by mouth at bedtime.  Marland Kitchen ofloxacin (OCUFLOX) 0.3 % ophthalmic solution Place 1 drop  into the right eye 3 (three) times daily.  . Probiotic Product (PROBIOTIC DAILY PO) Take 1 tablet by mouth at bedtime.  . rosuvastatin (CRESTOR) 10 MG tablet Take 1 tablet (10 mg total) by mouth daily.  . sertraline (ZOLOFT) 25 MG tablet Take 1 tablet (25 mg total) by mouth daily.  . sildenafil (VIAGRA) 100 MG tablet Take 0.5-1 tablets (50-100 mg total) by mouth daily as needed for erectile dysfunction.  . traZODone (DESYREL) 50 MG tablet TAKE 1/2 TO 1 (ONE-HALF TO ONE) TABLET BY MOUTH AT BEDTIME AS NEEDED FOR SLEEP  . TURMERIC PO Take by mouth daily.  Marland Kitchen VALERIAN ROOT PO Take 1,500 mg by mouth at bedtime.  . Zinc 50 MG TABS Take 1 tablet by mouth every morning.     Allergies:   Tape, Elavil [amitriptyline hcl], Neomy-bacit-polymyx-pramoxine, Other, Triple antibiotic [bacitracin-neomycin-polymyxin], and Wound dressing adhesive   Social History   Socioeconomic History  . Marital status: Married    Spouse name: Not on file  . Number of children: 1  . Years of education: Not on file  . Highest education level: Bachelor's degree (e.g., BA, AB, BS)  Occupational History  . 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 Smoking status: Former Smoker    Quit date: 05/31/1992    Years since quitting: 28.2  . Smokeless tobacco: Never Used  Vaping Use  . Vaping Use: Never used  Substance and Sexual Activity  . Alcohol use: Yes    Alcohol/week: 7.0 standard drinks    Types: 7 Glasses of wine per week    Comment: at least one drink per night; sometimes "we split a bottle of wine"  . Drug use: No  . Sexual activity: Yes  Other Topics Concern  . Not on file  Social History Narrative   UCD   Married x2: 12 years 43st; 15 years ('17) 2nd   One daughter, step daugfhter, step son   Laverle Hobby to bike   Social Determinants of Radio broadcast assistant Strain: Not on file  Food Insecurity: Not on file  Transportation Needs: Not on file  Physical Activity: Not  on file  Stress: Not on file  Social Connections: Not on file     Family History: The patient's family history includes Breast cancer (age of onset: 67) in his sister; Diabetes in his mother; Heart disease (age of onset: 10) in his mother; Prostate cancer (age of onset: 9) in his father; Stroke in his brother. There is no history of Colon cancer, Esophageal cancer, Rectal cancer, or Stomach cancer.  ROS:   Please see the history of present illness.     All other systems reviewed and are negative.  EKGs/Labs/Other Studies Reviewed:    The following studies were reviewed today: CT calcium score 07/10/2019 Ascending Aorta: Normal size, measuring 42 mm at the mid  ascending aorta, measured double oblique at PA bifurcation. No significant calcification. Pericardium: Normal Coronary arteries: Arise from normal coronary cusps.  IMPRESSION: Coronary calcium score of 631. This was 80th percentile for age and sex matched control.  Renal artery duplex ultrasound 07/24/2019 1. Chronically atrophied right kidney without evidence of hemodynamically significant renal artery stenosis or occlusion. 2. Compensatory hypertrophy of the left kidney suggests that the right-sided renal atrophy is a chronic abnormality. 3. Mild left-sided hydronephrosis of uncertain etiology. 4. Mild prostatomegaly. 5. No evidence of nephrolithiasis or renal mass.  MRA 06/18/2020 IMPRESSION: Mild aneurysmal disease of the ascending thoracic aorta measuring approximately 4.1 cm in maximum diameter. Recommend annual imaging followup by CTA or MRA  Stress test 06/18/2020  There was no ST segment deviation noted during stress.  No T wave inversion was noted during stress.   1. Negative ECG stress test for ischemia but only achieved 84% MPHR. Cannot exclude ischemia at higher heart rate.  2. Average exercise capacity (6:20 min:s; 7.0 METS).  3. Normal HR/BP response to exercise.  4. 84% MPHR achieved as noted  above.    EKG:  EKG is  ordered today. It shows sinus bradycardia with a single PAC, nonspecific T wave flattening, QTc 430 ms.  Recent Labs: 07/14/2020: ALT 6; BUN 24; Creatinine, Ser 1.37; Hemoglobin 14.0; Platelets 127.0; Potassium 4.3; Sodium 140; TSH 3.47  Recent Lipid Panel    Component Value Date/Time   CHOL 108 07/14/2020 1128   TRIG 65.0 07/14/2020 1128   HDL 56.90 07/14/2020 1128   CHOLHDL 2 07/14/2020 1128   VLDL 13.0 07/14/2020 1128   LDLCALC 39 07/14/2020 1128   LDLDIRECT 66.0 05/10/2016 0820    Physical Exam:    VS:  BP 118/80   Pulse (!) 56   Ht 5\' 10"  (1.778 m)   Wt 211 lb (95.7 kg)   SpO2 96%   BMI 30.28 kg/m     Wt Readings from Last 3 Encounters:  08/29/20 211 lb (95.7 kg)  07/14/20 206 lb 12.8 oz (93.8 kg)  07/09/20 214 lb (97.1 kg)     GEN: Borderline obese, well nourished, well developed in no acute distress HEENT: Normal NECK: No JVD; No carotid bruits LYMPHATICS: No lymphadenopathy CARDIAC: Bradycardic, RRR, no murmurs, rubs, gallops RESPIRATORY:  Clear to auscultation without rales, wheezing or rhonchi  ABDOMEN: Soft, non-tender, non-distended MUSCULOSKELETAL:  No edema; No deformity  SKIN: Warm and dry NEUROLOGIC:  Alert and oriented x 3 PSYCHIATRIC:  Normal affect   ASSESSMENT:    1. Coronary artery disease involving native coronary artery of native heart without angina pectoris   2. Ascending aortic aneurysm ([place])   3. Essential hypertension   4. Stage 3a chronic kidney disease ([place])   5. Hypercholesterolemia    PLAN:    In order of problems listed above:  1. Coronary calcification: Asymptomatic, active, risk factors well addressed. 2. Asc Ao Aneur: Very mild dilation, repeat MRA in a year. 3. HTN: well controled. ARB and beta blockers preferred for the aneurysm, ARB also for CKD. His nephrologist is Dr. Marval Regal.  His urologist is Dr. Diona Fanti 4. CKD 3: Stable creatinine around 1.4. Seems to have a GFR of around 45-50 since  2016. 5. HLP: excellent HDL and LDL on last assay.     Medication Adjustments/Labs and Tests Ordered: Current medicines are reviewed at length with the patient today.  Concerns regarding medicines are outlined above.  Orders Placed This Encounter  Procedures  . MR Angiogram Chest W  Wo Contrast  . Basic metabolic panel  . EKG 12-Lead   No orders of the defined types were placed in this encounter.   Patient Instructions  Medication Instructions:  No changes *If you need a refill on your cardiac medications before your next appointment, please call your pharmacy*   Lab Work: None ordered If you have labs (blood work) drawn today and your tests are completely normal, you will receive your results only by: Marland Kitchen MyChart Message (if you have MyChart) OR . A paper copy in the mail If you have any lab test that is abnormal or we need to change your treatment, we will call you to review the results.   Testing/Procedures: Dr. Sallyanne Kuster has ordered MRI examination of the chest to be completed January 2023    Follow-Up: At [facility], you and your health needs are our priority.  As part of our continuing mission to provide you with exceptional heart care, we have created designated Provider Care Teams.  These Care Teams include your primary Cardiologist (physician) and Advanced Practice Providers (APPs -  Physician Assistants and Nurse Practitioners) who all work together to provide you with the care you need, when you need it.  We recommend signing up for the patient portal called "MyChart".  Sign up information is provided on this After Visit Summary.  MyChart is used to connect with patients for Virtual Visits (Telemedicine).  Patients are able to view lab/test results, encounter notes, upcoming appointments, etc.  Non-urgent messages can be sent to your provider as well.   To learn more about what you can do with MyChart, go to [email or web address].    Your next appointment:   12  month(s)  The format for your next appointment:   In Person  Provider:   You may see Sanda Klein, MD or one of the following Advanced Practice Providers on your designated Care Team:    Almyra Deforest, PA-C  Fabian Sharp, [place] or   Roby Lofts, PA-C        Signed, Sanda Klein, MD  08/31/2020 9:46 PM    [place]

## 2020-09-03 ENCOUNTER — Telehealth: Payer: Self-pay | Admitting: Cardiovascular Disease

## 2020-09-03 NOTE — Telephone Encounter (Signed)
Left message for patient to call and discuss scheduling the MRA chest ordered by Dr. Sallyanne Kuster

## 2020-09-15 ENCOUNTER — Encounter: Payer: Self-pay | Admitting: Internal Medicine

## 2020-10-01 ENCOUNTER — Ambulatory Visit: Payer: Medicare Other | Admitting: Family Medicine

## 2020-11-20 DIAGNOSIS — I712 Thoracic aortic aneurysm, without rupture: Secondary | ICD-10-CM | POA: Diagnosis not present

## 2020-11-20 DIAGNOSIS — I129 Hypertensive chronic kidney disease with stage 1 through stage 4 chronic kidney disease, or unspecified chronic kidney disease: Secondary | ICD-10-CM | POA: Diagnosis not present

## 2020-11-20 DIAGNOSIS — N200 Calculus of kidney: Secondary | ICD-10-CM | POA: Diagnosis not present

## 2020-11-20 DIAGNOSIS — N1831 Chronic kidney disease, stage 3a: Secondary | ICD-10-CM | POA: Diagnosis not present

## 2020-12-24 DIAGNOSIS — L821 Other seborrheic keratosis: Secondary | ICD-10-CM | POA: Diagnosis not present

## 2020-12-24 DIAGNOSIS — L570 Actinic keratosis: Secondary | ICD-10-CM | POA: Diagnosis not present

## 2020-12-24 DIAGNOSIS — Z85828 Personal history of other malignant neoplasm of skin: Secondary | ICD-10-CM | POA: Diagnosis not present

## 2020-12-24 DIAGNOSIS — D692 Other nonthrombocytopenic purpura: Secondary | ICD-10-CM | POA: Diagnosis not present

## 2020-12-31 ENCOUNTER — Encounter: Payer: Self-pay | Admitting: Family Medicine

## 2021-01-12 ENCOUNTER — Ambulatory Visit: Payer: Medicare Other | Admitting: Internal Medicine

## 2021-01-15 ENCOUNTER — Other Ambulatory Visit: Payer: Self-pay

## 2021-01-15 ENCOUNTER — Encounter: Payer: Self-pay | Admitting: Internal Medicine

## 2021-01-15 ENCOUNTER — Ambulatory Visit (INDEPENDENT_AMBULATORY_CARE_PROVIDER_SITE_OTHER): Payer: Medicare Other | Admitting: Internal Medicine

## 2021-01-15 VITALS — BP 122/78 | HR 49 | Temp 98.2°F | Ht 70.0 in | Wt 209.2 lb

## 2021-01-15 DIAGNOSIS — G20 Parkinson's disease: Secondary | ICD-10-CM | POA: Diagnosis not present

## 2021-01-15 DIAGNOSIS — E559 Vitamin D deficiency, unspecified: Secondary | ICD-10-CM | POA: Diagnosis not present

## 2021-01-15 DIAGNOSIS — I2510 Atherosclerotic heart disease of native coronary artery without angina pectoris: Secondary | ICD-10-CM | POA: Diagnosis not present

## 2021-01-15 DIAGNOSIS — N183 Chronic kidney disease, stage 3 unspecified: Secondary | ICD-10-CM

## 2021-01-15 DIAGNOSIS — E538 Deficiency of other specified B group vitamins: Secondary | ICD-10-CM

## 2021-01-15 DIAGNOSIS — F32A Depression, unspecified: Secondary | ICD-10-CM | POA: Insufficient documentation

## 2021-01-15 DIAGNOSIS — F329 Major depressive disorder, single episode, unspecified: Secondary | ICD-10-CM

## 2021-01-15 LAB — COMPREHENSIVE METABOLIC PANEL
ALT: 12 U/L (ref 0–53)
AST: 27 U/L (ref 0–37)
Albumin: 4.4 g/dL (ref 3.5–5.2)
Alkaline Phosphatase: 49 U/L (ref 39–117)
BUN: 24 mg/dL — ABNORMAL HIGH (ref 6–23)
CO2: 28 mEq/L (ref 19–32)
Calcium: 9.5 mg/dL (ref 8.4–10.5)
Chloride: 102 mEq/L (ref 96–112)
Creatinine, Ser: 1.4 mg/dL (ref 0.40–1.50)
GFR: 51.1 mL/min — ABNORMAL LOW (ref >60.00)
Glucose, Bld: 93 mg/dL (ref 70–99)
Potassium: 4.4 mEq/L (ref 3.5–5.1)
Sodium: 138 mEq/L (ref 135–145)
Total Bilirubin: 0.6 mg/dL (ref 0.2–1.2)
Total Protein: 7.2 g/dL (ref 6.0–8.3)

## 2021-01-15 LAB — VITAMIN D 25 HYDROXY (VIT D DEFICIENCY, FRACTURES): VITD: 50.1 ng/mL (ref 30.00–100.00)

## 2021-01-15 NOTE — Assessment & Plan Note (Signed)
Cont w/Crestor, ASA

## 2021-01-15 NOTE — Assessment & Plan Note (Signed)
Cont w/Zoloft

## 2021-01-15 NOTE — Progress Notes (Signed)
Subjective:  Patient ID: Adrian [name], male    DOB: 08/27/1950  Age: 70 y.o. MRN: [id number]  CC: No chief complaint on file.   HPI Adrian [name] presents for CAD, AA, PD, CRI f/u  Outpatient Medications Prior to Visit  Medication Sig Dispense Refill   carbidopa-levodopa (SINEMET IR) 25-100 MG tablet Take 1 tablet by mouth 4 (four) times daily. 360 tablet 3   Cetirizine HCl (ZYRTEC ALLERGY) 10 MG CAPS      diphenhydrAMINE HCl (BENADRYL ALLERGY PO)      fluticasone (FLONASE) 50 MCG/ACT nasal spray Place into both nostrils daily.     gabapentin (NEURONTIN) 100 MG capsule Take 2 capsules (200 mg total) by mouth at bedtime. 180 capsule 3   halobetasol (ULTRAVATE) 0.05 % cream APPLY CREAM TOPICALLY 2 TIMES DAILY AS NEEDED 45 g 1   levothyroxine (SYNTHROID) 75 MCG tablet Take 1 tablet (75 mcg total) by mouth daily. 90 tablet 3   losartan (COZAAR) 100 MG tablet Take 1 tablet (100 mg total) by mouth daily. 90 tablet 3   methylPREDNISolone (MEDROL DOSEPAK) 4 MG TBPK tablet As directed 21 tablet 0   Misc Natural Products (TART CHERRY ADVANCED) CAPS Take by mouth daily.     Multiple Vitamins-Minerals (CENTRUM SILVER ADULT 50+ PO) Take 1 tablet by mouth at bedtime.     ofloxacin (OCUFLOX) 0.3 % ophthalmic solution Place 1 drop into the right eye 3 (three) times daily.     Probiotic Product (PROBIOTIC DAILY PO) Take 1 tablet by mouth at bedtime.     rosuvastatin (CRESTOR) 10 MG tablet Take 1 tablet (10 mg total) by mouth daily. 90 tablet 3   sertraline (ZOLOFT) 25 MG tablet Take 1 tablet (25 mg total) by mouth daily. 90 tablet 1   sildenafil (VIAGRA) 100 MG tablet Take 0.5-1 tablets (50-100 mg total) by mouth daily as needed for erectile dysfunction. 12 tablet 5   traZODone (DESYREL) 50 MG tablet TAKE 1/2 TO 1 (ONE-HALF TO ONE) TABLET BY MOUTH AT BEDTIME AS NEEDED FOR SLEEP 90 tablet 1   TURMERIC PO Take by mouth daily.     VALERIAN ROOT PO Take 1,500 mg by mouth at bedtime.     Zinc 50 MG  TABS Take 1 tablet by mouth every morning.     No facility-administered medications prior to visit.    ROS: Review of Systems  Constitutional:  Positive for unexpected weight change. Negative for appetite change and fatigue.  HENT:  Negative for congestion, nosebleeds, sneezing, sore throat and trouble swallowing.   Eyes:  Negative for itching and visual disturbance.  Respiratory:  Negative for cough.   Cardiovascular:  Negative for chest pain, palpitations and leg swelling.  Gastrointestinal:  Negative for abdominal distention, blood in stool, diarrhea and nausea.  Genitourinary:  Negative for frequency and hematuria.  Musculoskeletal:  Positive for gait problem. Negative for back pain, joint swelling and neck pain.  Skin:  Negative for rash.  Neurological:  Positive for tremors. Negative for dizziness, speech difficulty and weakness.  Psychiatric/Behavioral:  Negative for agitation, dysphoric mood and sleep disturbance. The patient is not nervous/anxious.    Objective:  There were no vitals taken for this visit.  BP Readings from Last 3 Encounters:  08/29/20 118/80  07/14/20 130/82  07/09/20 120/76    Wt Readings from Last 3 Encounters:  08/29/20 211 lb (95.7 kg)  07/14/20 206 lb 12.8 oz (93.8 kg)  07/09/20 214 lb (97.1 kg)    Physical  Exam Constitutional:      General: He is not in acute distress.    Appearance: He is well-developed. He is obese.     Comments: NAD  Eyes:     Conjunctiva/sclera: Conjunctivae normal.     Pupils: Pupils are equal, round, and reactive to light.  Neck:     Thyroid: No thyromegaly.     Vascular: No JVD.  Cardiovascular:     Rate and Rhythm: Normal rate and regular rhythm.     Heart sounds: Normal heart sounds. No murmur heard.   No friction rub. No gallop.  Pulmonary:     Effort: Pulmonary effort is normal. No respiratory distress.     Breath sounds: Normal breath sounds. No wheezing or rales.  Chest:     Chest wall: No tenderness.   Abdominal:     General: Bowel sounds are normal. There is no distension.     Palpations: Abdomen is soft. There is no mass.     Tenderness: There is no abdominal tenderness. There is no guarding or rebound.  Musculoskeletal:        General: No tenderness. Normal range of motion.     Cervical back: Normal range of motion.  Lymphadenopathy:     Cervical: No cervical adenopathy.  Skin:    General: Skin is warm and dry.     Findings: No rash.  Neurological:     Mental Status: He is alert and oriented to person, place, and time.     Cranial Nerves: No cranial nerve deficit.     Motor: No abnormal muscle tone.     Coordination: Coordination normal.     Gait: Gait normal.     Deep Tendon Reflexes: Reflexes are normal and symmetric.  Psychiatric:        Behavior: Behavior normal.        Thought Content: Thought content normal.        Judgment: Judgment normal.  tremor Lab Results  Component Value Date   WBC 4.9 07/14/2020   HGB 14.0 07/14/2020   HCT 41.6 07/14/2020   PLT 127.0 (L) 07/14/2020   GLUCOSE 104 (H) 07/14/2020   CHOL 108 07/14/2020   TRIG 65.0 07/14/2020   HDL 56.90 07/14/2020   LDLDIRECT 66.0 05/10/2016   LDLCALC 39 07/14/2020   ALT 6 07/14/2020   AST 12 07/14/2020   NA 140 07/14/2020   K 4.3 07/14/2020   CL 105 07/14/2020   CREATININE 1.37 07/14/2020   BUN 24 (H) 07/14/2020   CO2 29 07/14/2020   TSH 3.47 07/14/2020   PSA 0.96 07/14/2020   INR 1.0 RATIO 09/18/2007   HGBA1C 5.6 06/20/2019    MR Angiogram Chest W Wo Contrast  Result Date: 06/18/2020 CLINICAL DATA:  Mild aneurysmal dilatation of the ascending thoracic aorta detected by prior unenhanced coronary calcium score study. EXAM: MRA CHEST WITH CONTRAST TECHNIQUE: Angiographic images of the chest were obtained using MRA technique with intravenous contrast. CONTRAST:  4m GADAVIST GADOBUTROL 1 MMOL/ML IV SOLN COMPARISON:  Coronary calcium score CT on 07/10/2019 FINDINGS: VASCULAR Aorta: The aortic root  measures approximately 3.9 cm at the level of the sinuses of Valsalva. The ascending thoracic aorta is mildly dilated and measures approximately 4.1 cm in maximum diameter. The aortic arch measures 3.1-3.2 cm. The descending thoracic aorta measures 2.8 cm. There is no evidence of aortic dissection. Visualized proximal great vessels demonstrate normal patency and normal branching anatomy. Heart: The heart size is within normal limits. No pericardial fluid identified.  Pulmonary Arteries: Central pulmonary arteries are normal in caliber. NON-VASCULAR No pleural fluid or pulmonary abnormalities identified. No evidence of lymphadenopathy in the chest. Visualized upper abdomen demonstrates a cyst in the left lobe of the liver measuring up to 6 cm in maximum diameter. Visualized bony structures are unremarkable. 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 Recommend annual imaging followup by CTA or MRA. This recommendation follows 2010 ACCF/AHA/AATS/ACR/ASA/SCA/SCAI/SIR/STS/SVM Guidelines for the Diagnosis and Management of Patients with Thoracic Aortic Disease. Circulation. 2010; [id number]. Aortic aneurysm NOS (ICD10-I71.9) Electronically Signed   By: Aletta Edouard M.D.   On: 06/18/2020 12:55   EXERCISE TOLERANCE TEST (ETT)  Result Date: 06/18/2020  There was no ST segment deviation noted during stress.  No T wave inversion was noted during stress.  1. Negative ECG stress test for ischemia but only achieved 84% MPHR. Cannot exclude ischemia at higher heart rate. 2. Average exercise capacity (6:20 min:s; 7.0 METS). 3. Normal HR/BP response to exercise. 4. 84% MPHR achieved as noted above.    Assessment & Plan:    Walker Kehr, MD

## 2021-01-15 NOTE — Addendum Note (Signed)
Addended by: Jacobo Forest on: 01/15/2021 01:53 PM   Modules accepted: Orders

## 2021-01-15 NOTE — Assessment & Plan Note (Signed)
On Vit B12 

## 2021-01-15 NOTE — Assessment & Plan Note (Addendum)
F/u w/Dr Justin Mend Monitor GFR.

## 2021-01-15 NOTE — Assessment & Plan Note (Addendum)
Stable On Sinemet SR

## 2021-01-20 ENCOUNTER — Other Ambulatory Visit: Payer: Self-pay | Admitting: Internal Medicine

## 2021-01-20 ENCOUNTER — Other Ambulatory Visit: Payer: Self-pay | Admitting: Family Medicine

## 2021-01-20 NOTE — Progress Notes (Signed)
[place] Phone: [phone] Subjective:   Adrian [name], am serving as a scribe for Dr. Hulan Saas.  This visit occurred during the SARS-CoV-2 public health emergency.  Safety protocols were in place, including screening questions prior to the visit, additional usage of staff PPE, and extensive cleaning of exam room while observing appropriate contact time as indicated for disinfecting solutions.   I'm seeing this patient by the request  of:  Plotnikov, Evie Lacks, MD  CC: thumb pain   [id number]  Adrian [name] is a 70 y.o. male coming in with complaint of R thumb pain. Last injection 07/09/20. Patient has known [place] arthritis. Patient states that last injection did not alleviate his pain as much as he would like. Previous injections helped more than this most recent injection.      Past Medical History:  Diagnosis Date   Allergic rhinitis    Cancer ([place]) 2016   basal cell skin cancer nose   Cataract    GERD (gastroesophageal reflux disease)    Hyperlipidemia    Hypertension    Hypothyroidism 2010   mild   Hypothyroidism    Trigeminal neuralgia    L.   Vitamin B12 deficiency 2010   Past Surgical History:  Procedure Laterality Date   ARTHRODESIS METATARSALPHALANGEAL JOINT (MTPJ) Right 07/27/2018   Procedure: Right hallux metatarsal phalangeal joint arthrodesis;  Surgeon: Wylene Simmer, MD;  Location: [facility];  Service: Orthopedics;  Laterality: Right;   arthroscopic surgery for chondromalacia Left 1995   CATARACT EXTRACTION W/ INTRAOCULAR LENS  IMPLANT, BILATERAL  2017   COLONOSCOPY     gamma knife surgery for trigeminal neuralgia  2005   inguinal herniorrhahpy  1991   L.   POLYPECTOMY     TONSILLECTOMY  1959   TRIGEMINAL NERVE DECOMPRESSION  2008   Left  Dr. 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, molecular  Tobacco Use   Smoking status: Former    Types: Cigarettes    Quit date: 05/31/1992    Years since quitting: 28.6   Smokeless tobacco: Never  Vaping Use   Vaping Use: Never used  Substance and Sexual Activity   Alcohol use: Yes    Alcohol/week: 7.0 standard drinks    Types: 7 Glasses of wine per week    Comment: at least one drink per night; sometimes "we split a bottle of wine"   Drug use: [name]   Sexual activity: Yes  Other Topics Concern   Not on file  Social History Narrative   UCD   Married x2: 12 years 1st; 15 years ('26) 2nd   One daughter, step daugfhter, step son   Laverle Hobby to bike   Social Determinants of Radio broadcast assistant Strain: Not on file  Food Insecurity: Not on file  Transportation Needs: Not on file  Physical Activity: Not on file  Stress: Not on file  Social Connections: Not on file   Allergies  Allergen Reactions   Tape Rash    Irritation   Elavil [Amitriptyline Hcl]     constipation   Neomy-Bacit-Polymyx-Pramoxine Rash    Itching  Other Rash   Triple Antibiotic [Bacitracin-Neomycin-Polymyxin] Rash   Wound Dressing Adhesive Rash   Family History  Problem Relation Age of Onset   Heart disease Mother 37       MI   Diabetes Mother    Prostate cancer Father 50       prostate   Breast cancer Sister 48       breast ca   Stroke Brother    Colon cancer Neg Hx    Esophageal cancer Neg Hx    Rectal cancer Neg Hx    Stomach cancer Neg Hx     Current Outpatient Medications (Endocrine & Metabolic):    levothyroxine (SYNTHROID) 75 MCG tablet, Take 1 tablet (75 mcg total) by mouth daily.   methylPREDNISolone (MEDROL DOSEPAK) 4 MG TBPK tablet, As directed  Current Outpatient Medications (Cardiovascular):    losartan (COZAAR) 100 MG tablet,  Take 1 tablet (100 mg total) by mouth daily.   rosuvastatin (CRESTOR) 10 MG tablet, Take 1 tablet (10 mg total) by mouth daily.   sildenafil (VIAGRA) 100 MG tablet, Take 0.5-1 tablets (50-100 mg total) by mouth daily as needed for erectile dysfunction.  Current Outpatient Medications (Respiratory):    Cetirizine HCl (ZYRTEC ALLERGY) 10 MG CAPS,    diphenhydrAMINE HCl (BENADRYL ALLERGY PO),    fluticasone (FLONASE) 50 MCG/ACT nasal spray, Place into both nostrils daily.    Current Outpatient Medications (Other):    carbidopa-levodopa (SINEMET IR) 25-100 MG tablet, Take 1 tablet by mouth 4 (four) times daily.   gabapentin (NEURONTIN) 100 MG capsule, TAKE 2 CAPSULES BY MOUTH AT BEDTIME   halobetasol (ULTRAVATE) 0.05 % cream, APPLY CREAM TOPICALLY 2 TIMES DAILY AS NEEDED   Misc Natural Products (TART CHERRY ADVANCED) CAPS, Take by mouth daily.   Multiple Vitamins-Minerals (CENTRUM SILVER ADULT 50+ PO), Take 1 tablet by mouth at bedtime.   ofloxacin (OCUFLOX) 0.3 % ophthalmic solution, Place 1 drop into the right eye 3 (three) times daily.   Probiotic Product (PROBIOTIC DAILY PO), Take 1 tablet by mouth at bedtime.   sertraline (ZOLOFT) 25 MG tablet, Take 1 tablet (25 mg total) by mouth daily.   traZODone (DESYREL) 50 MG tablet, TAKE 1/2 TO 1 (ONE-HALF TO ONE) TABLET BY MOUTH AT BEDTIME AS NEEDED FOR SLEEP   TURMERIC PO, Take by mouth daily.   VALERIAN ROOT PO, Take 1,500 mg by mouth at bedtime.   Zinc 50 MG TABS, Take 1 tablet by mouth every morning.   Reviewed prior external information including notes and imaging from  primary care provider As well as notes that were available from care everywhere and other healthcare systems.  Past medical history, social, surgical and family history all reviewed in electronic medical record.  [name] pertanent information unless stated regarding to the chief complaint.   Review of Systems:  [name] headache, visual changes, nausea, vomiting, diarrhea,  constipation, dizziness, abdominal pain, skin rash, fevers, chills, night sweats, weight loss, swollen lymph nodes, body aches, joint swelling, chest pain, shortness of breath, mood changes. POSITIVE muscle aches  Objective  Blood pressure 140/82, pulse (!) 51, height '5\' 10"'$  (1.778 m), weight 218 lb (98.9 kg), SpO2 96 %.   General: [name] apparent distress alert and oriented x3 mood and affect normal, dressed appropriately.  Mild masked facies and patient does have a resting tremor of the upper extremity on the left side HEENT: Pupils equal, extraocular movements intact  Respiratory: Patient's speak in full sentences and does not appear short of breath  Cardiovascular: [name] lower extremity edema, non tender, [name] erythema  Gait normal with good balance and coordination.  MSK: Mild cogwheeling of multiple joints. Right CMC joint does have a mild effusion noted.  Positive grind test noted.  Procedure: Real-time Ultrasound Guided Injection of right CMC joint Device: GE Logiq Q7 Ultrasound guided injection is preferred based studies that show increased duration, increased effect, greater accuracy, decreased procedural pain, increased response rate, and decreased cost with ultrasound guided versus blind injection.  Verbal informed consent obtained.  Time-out conducted.  Noted [name] overlying erythema, induration, or other signs of local infection.  Skin prepped in a sterile fashion.  Local anesthesia: Topical Ethyl chloride.  With sterile technique and under real time ultrasound guidance: With a 25-gauge half inch needle injecting 0.5 cc of 0.5% Marcaine and 0.5 cc of Kenalog 40 mg/mL. Completed without difficulty  Pain immediately resolved suggesting accurate placement of the medication.  Advised to call if fevers/chills, erythema, induration, drainage, or persistent bleeding.  Impression: Technically successful ultrasound guided injection.    Impression and Recommendations:     The above  documentation has been reviewed and is accurate and complete Lyndal Pulley, DO

## 2021-01-21 ENCOUNTER — Encounter: Payer: Self-pay | Admitting: Family Medicine

## 2021-01-21 ENCOUNTER — Ambulatory Visit: Payer: Self-pay

## 2021-01-21 ENCOUNTER — Ambulatory Visit: Payer: Medicare Other | Admitting: Family Medicine

## 2021-01-21 ENCOUNTER — Other Ambulatory Visit: Payer: Self-pay

## 2021-01-21 VITALS — BP 140/82 | HR 51 | Ht 70.0 in | Wt 218.0 lb

## 2021-01-21 DIAGNOSIS — M19049 Primary osteoarthritis, unspecified hand: Secondary | ICD-10-CM

## 2021-01-21 DIAGNOSIS — G20 Parkinson's disease: Secondary | ICD-10-CM | POA: Diagnosis not present

## 2021-01-21 DIAGNOSIS — M79644 Pain in right finger(s): Secondary | ICD-10-CM

## 2021-01-21 DIAGNOSIS — G8929 Other chronic pain: Secondary | ICD-10-CM | POA: Diagnosis not present

## 2021-01-21 DIAGNOSIS — G20A1 Parkinson's disease without dyskinesia, without mention of fluctuations: Secondary | ICD-10-CM

## 2021-01-21 NOTE — Assessment & Plan Note (Signed)
Patient continues to see for this.  He does continue to have tremors in the left hand.  Mild weakness noted still.

## 2021-01-21 NOTE — Assessment & Plan Note (Addendum)
Patient given injection and tolerated the procedure well.  Discussed icing regimen and home exercises.  Patient feels like though he would like more of a curative measure and would like to consider the possibility of surgical intervention.  Discussed with patient that he does have only moderate arthritic changes but does want to follow-up with surgery.  Increase activity slowly.  Follow-up again as needed

## 2021-01-21 NOTE — Patient Instructions (Signed)
Injected thumb today Dr. Amedeo Plenty I'm here if you have questions

## 2021-01-22 ENCOUNTER — Ambulatory Visit: Payer: Medicare Other | Admitting: Family Medicine

## 2021-04-02 DIAGNOSIS — G20 Parkinson's disease: Secondary | ICD-10-CM | POA: Diagnosis not present

## 2021-04-02 DIAGNOSIS — R262 Difficulty in walking, not elsewhere classified: Secondary | ICD-10-CM | POA: Diagnosis not present

## 2021-04-02 NOTE — Progress Notes (Signed)
I, Wendy Poet, LAT, ATC, am serving as scribe for Dr. Lynne [name]  Adrian [name] is a 70 y.o. male who presents to [facility] today for L ankle pain. Pt was previously seen by Dr. 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 Today, pt c/o L ankle pain x  years that has recently worsened over the last 3-4 weeks.  Pt locates pain to his L ant ankle joint that radiates to the medial and post ankle.  He has a hx of Parkinson's dz that was diagnosed approximately 3 years ago.  L ankle swelling: No Aggravates: initial weight-bearing after sitting/resting;  Treatments tried: brace; Tylenol;    Pertinent review of systems: No fevers or chills  Relevant historical information: Hypertension   Exam:  BP 112/80 (BP Location: Left Arm, Patient Position: Sitting, Cuff Size: Normal)   Pulse (!) 57   Ht 5\' 10"  (1.778 m)   Wt 211 lb 9.6 oz (96 kg)   SpO2 95%   BMI 30.36 kg/m  General: Well Developed, well nourished, and in no acute distress.   MSK: Left ankle Lower extremity with minimal nonpitting edema. Left ankle with no obvious joint effusion. Mildly tender palpation at ATFL region. Normal ankle motion. Some pain with ankle eversion Strength is intact. Stable ligamentous exam. Pulses cap refill and sensation are intact distally  Neuro Gait: Somewhat shuffling. Resting tremor bilateral upper extremity.    Lab and Radiology Results  Diagnostic Limited MSK Ultrasound of: Left ankle No significant joint effusion and ankle joint. Edema present at posterior to lateral ankle. Intact appearing peroneal and posterior tibialis tendons. Tender palpation with ultrasound probe at ATFL region but no definitive abnormality seen in this region. Impression: Lower extremity edema  X-ray images left ankle obtained today personally and independently interpreted Mild degenerative changes.  No acute fractures. Await formal radiology review    Assessment and  Plan: 70 y.o. male with left ankle pain.  Chronic ankle pain without clear obvious abnormality aside from some lower extremity edema on exam today.  Plan for general conservative management strategies including ankle compression home exercise program and Voltaren gel.  Recheck back in 4 to 6 weeks with Dr. Tamala Julian or myself.  Next step if not better could be injection or even MRI to further characterize cause of pain. Patient is currently receiving some physical therapy with [facility] for his mobility due to Parkinson's disease.  This will be an obvious referral location for PT if needed in the future for his ankle.   PDMP not reviewed this encounter. Orders Placed This Encounter  Procedures   [place] LIMITED JOINT SPACE STRUCTURES LOW LEFT(NO LINKED CHARGES)    Order Specific Question:   Reason for Exam (SYMPTOM  OR DIAGNOSIS REQUIRED)    Answer:   L ankle pain    Order Specific Question:   Preferred imaging location?    Answer:   [facility]   DG Ankle Complete Left    Standing Status:   Future    Number of Occurrences:   1    Standing Expiration Date:   05/03/2021    Order Specific Question:   Reason for Exam (SYMPTOM  OR DIAGNOSIS REQUIRED)    Answer:   L ankle pain    Order Specific Question:   Preferred imaging location?    Answer:   [facility]   No orders of the defined types were placed in this encounter.  Discussed warning signs or symptoms. Please see discharge instructions. Patient expresses understanding.   The above documentation has been reviewed and is accurate and complete Adrian [name], M.D.

## 2021-04-03 ENCOUNTER — Other Ambulatory Visit: Payer: Self-pay

## 2021-04-03 ENCOUNTER — Ambulatory Visit (INDEPENDENT_AMBULATORY_CARE_PROVIDER_SITE_OTHER): Payer: Medicare Other

## 2021-04-03 ENCOUNTER — Encounter: Payer: Self-pay | Admitting: Family Medicine

## 2021-04-03 ENCOUNTER — Ambulatory Visit: Payer: Self-pay

## 2021-04-03 ENCOUNTER — Ambulatory Visit: Payer: Medicare Other | Admitting: Family Medicine

## 2021-04-03 VITALS — BP 112/80 | HR 57 | Ht 70.0 in | Wt 211.6 lb

## 2021-04-03 DIAGNOSIS — M25572 Pain in left ankle and joints of left foot: Secondary | ICD-10-CM

## 2021-04-03 DIAGNOSIS — G8929 Other chronic pain: Secondary | ICD-10-CM

## 2021-04-03 NOTE — Patient Instructions (Signed)
Nice to see you today.  Please use Voltaren gel (Generic Diclofenac Gel) up to 4x daily for pain as needed.  This is available over-the-counter as both the name brand Voltaren gel and the generic diclofenac gel.   I recommend you obtained a compression sleeve to help with your joint problems. There are many options on the market however I recommend obtaining a ankle Body Helix compression sleeve.  You can find information (including how to appropriate measure yourself for sizing) can be found at www.Body http://www.lambert.com/.  Many of these products are health savings account (HSA) eligible.   You can use the compression sleeve at any time throughout the day but is most important to use while being active as well as for 2 hours post-activity.   It is appropriate to ice following activity with the compression sleeve in place.   Please perform the exercise program that we have prepared for you and gone over in detail on a daily basis.  In addition to the handout you were provided you can access your program through: www.my-exercise-code.com   Your unique program code is:   [email or web address]  Please get an Xray today before you leave.  Follow-up:4-6 weeks w/ Dr. Tamala Julian or Dr. Georgina Snell

## 2021-04-04 NOTE — Progress Notes (Signed)
Left ankle x-ray looks normal to radiology.

## 2021-04-07 DIAGNOSIS — R262 Difficulty in walking, not elsewhere classified: Secondary | ICD-10-CM | POA: Diagnosis not present

## 2021-04-07 DIAGNOSIS — G20 Parkinson's disease: Secondary | ICD-10-CM | POA: Diagnosis not present

## 2021-04-08 DIAGNOSIS — G20 Parkinson's disease: Secondary | ICD-10-CM | POA: Diagnosis not present

## 2021-04-08 DIAGNOSIS — F32A Depression, unspecified: Secondary | ICD-10-CM | POA: Diagnosis not present

## 2021-04-14 DIAGNOSIS — R262 Difficulty in walking, not elsewhere classified: Secondary | ICD-10-CM | POA: Diagnosis not present

## 2021-04-14 DIAGNOSIS — G20 Parkinson's disease: Secondary | ICD-10-CM | POA: Diagnosis not present

## 2021-04-15 ENCOUNTER — Other Ambulatory Visit: Payer: Self-pay | Admitting: Family Medicine

## 2021-04-16 DIAGNOSIS — R262 Difficulty in walking, not elsewhere classified: Secondary | ICD-10-CM | POA: Diagnosis not present

## 2021-04-16 DIAGNOSIS — G20 Parkinson's disease: Secondary | ICD-10-CM | POA: Diagnosis not present

## 2021-04-27 DIAGNOSIS — M1811 Unilateral primary osteoarthritis of first carpometacarpal joint, right hand: Secondary | ICD-10-CM | POA: Diagnosis not present

## 2021-04-29 ENCOUNTER — Other Ambulatory Visit: Payer: Self-pay | Admitting: Internal Medicine

## 2021-04-30 ENCOUNTER — Telehealth: Payer: Self-pay

## 2021-04-30 DIAGNOSIS — G20 Parkinson's disease: Secondary | ICD-10-CM | POA: Diagnosis not present

## 2021-04-30 DIAGNOSIS — R262 Difficulty in walking, not elsewhere classified: Secondary | ICD-10-CM | POA: Diagnosis not present

## 2021-04-30 NOTE — Telephone Encounter (Signed)
   [facility] Pre-operative Risk Assessment    Patient Name: Eddi Hymes  DOB: [birth year] MRN: [id number]  HEARTCARE STAFF:  - IMPORTANT!!!!!! Under Visit Info/Reason for Call, type in Other and utilize the format Clearance MM/DD/YY or Clearance TBD. Do not use dashes or single digits. - Please review there is not already an duplicate clearance open for this procedure. - If request is for dental extraction, please clarify the # of teeth to be extracted. - If the patient is currently at the dentist's office, call Pre-Op Callback Staff (MA/nurse) to input urgent request.  - If the patient is not currently in the dentist office, please route to the Pre-Op pool.  Request for surgical clearance:  What type of surgery is being performed? Right thumb CMC arthroplasty with double tendon transfer and tight rope suspension and repair as necessary  When is this surgery scheduled? 07/07/21  What type of clearance is required (medical clearance vs. Pharmacy clearance to hold med vs. Both)? Medical Clearance   Are there any medications that need to be held prior to surgery and how long? None listed   Practice name and name of physician performing surgery? [facility], Dr. Roseanne Kaufman  What is the office phone number? [phone]   7.   What is the office fax number? [phone] Attn: Hassan Buckler   8.   Anesthesia type (None, local, MAC, general) ? Block with IV Sed   Jacqulynn Cadet 04/30/2021, 4:19 PM  _________________________________________________________________   (provider comments below)

## 2021-05-01 NOTE — Telephone Encounter (Signed)
I s/w the pt and he is agreeable to pre op appt needed. Pt has been scheduled to see Doreene Adas, [facility] 07/01/21 @ 9:15 at the [facility] location. Pt thanked me for the call and the help. I will send FYI to requesting office the pt has appt 07/01/21. Will send notes to Dr. Percival Spanish for appt 07/01/21.

## 2021-05-01 NOTE — Telephone Encounter (Signed)
Primary Cardiologist:Mihai Croitoru, MD  Chart reviewed as part of pre-operative protocol coverage. Because of Adrian [name]'s past medical history and time since last visit, he/she will require a follow-up visit in order to better assess preoperative cardiovascular risk.  Pre-op covering staff: - Please schedule appointment and call patient to inform them. - Please contact requesting surgeon's office via preferred method (i.e, phone, fax) to inform them of need for appointment prior to surgery.  If applicable, this message will also be routed to pharmacy pool and/or primary cardiologist for input on holding anticoagulant/antiplatelet agent as requested below so that this information is available at time of patient's appointment.   Deberah Pelton, NP  05/01/2021, 9:06 AM

## 2021-05-05 ENCOUNTER — Ambulatory Visit: Payer: Medicare Other | Admitting: Family Medicine

## 2021-05-05 DIAGNOSIS — G20 Parkinson's disease: Secondary | ICD-10-CM | POA: Diagnosis not present

## 2021-05-05 DIAGNOSIS — R262 Difficulty in walking, not elsewhere classified: Secondary | ICD-10-CM | POA: Diagnosis not present

## 2021-05-06 DIAGNOSIS — H43393 Other vitreous opacities, bilateral: Secondary | ICD-10-CM | POA: Diagnosis not present

## 2021-05-06 DIAGNOSIS — Z961 Presence of intraocular lens: Secondary | ICD-10-CM | POA: Diagnosis not present

## 2021-05-06 DIAGNOSIS — H524 Presbyopia: Secondary | ICD-10-CM | POA: Diagnosis not present

## 2021-05-07 DIAGNOSIS — R262 Difficulty in walking, not elsewhere classified: Secondary | ICD-10-CM | POA: Diagnosis not present

## 2021-05-07 DIAGNOSIS — G20 Parkinson's disease: Secondary | ICD-10-CM | POA: Diagnosis not present

## 2021-05-11 ENCOUNTER — Encounter: Payer: Self-pay | Admitting: Gastroenterology

## 2021-05-12 DIAGNOSIS — R262 Difficulty in walking, not elsewhere classified: Secondary | ICD-10-CM | POA: Diagnosis not present

## 2021-05-12 DIAGNOSIS — G20 Parkinson's disease: Secondary | ICD-10-CM | POA: Diagnosis not present

## 2021-05-14 DIAGNOSIS — G20 Parkinson's disease: Secondary | ICD-10-CM | POA: Diagnosis not present

## 2021-05-14 DIAGNOSIS — R262 Difficulty in walking, not elsewhere classified: Secondary | ICD-10-CM | POA: Diagnosis not present

## 2021-05-19 DIAGNOSIS — G20 Parkinson's disease: Secondary | ICD-10-CM | POA: Diagnosis not present

## 2021-05-19 DIAGNOSIS — R262 Difficulty in walking, not elsewhere classified: Secondary | ICD-10-CM | POA: Diagnosis not present

## 2021-06-02 DIAGNOSIS — R262 Difficulty in walking, not elsewhere classified: Secondary | ICD-10-CM | POA: Diagnosis not present

## 2021-06-02 DIAGNOSIS — G20 Parkinson's disease: Secondary | ICD-10-CM | POA: Diagnosis not present

## 2021-06-04 DIAGNOSIS — G20 Parkinson's disease: Secondary | ICD-10-CM | POA: Diagnosis not present

## 2021-06-04 DIAGNOSIS — R262 Difficulty in walking, not elsewhere classified: Secondary | ICD-10-CM | POA: Diagnosis not present

## 2021-06-08 DIAGNOSIS — G20 Parkinson's disease: Secondary | ICD-10-CM | POA: Diagnosis not present

## 2021-06-08 DIAGNOSIS — I129 Hypertensive chronic kidney disease with stage 1 through stage 4 chronic kidney disease, or unspecified chronic kidney disease: Secondary | ICD-10-CM | POA: Diagnosis not present

## 2021-06-08 DIAGNOSIS — N200 Calculus of kidney: Secondary | ICD-10-CM | POA: Diagnosis not present

## 2021-06-08 DIAGNOSIS — N1330 Unspecified hydronephrosis: Secondary | ICD-10-CM | POA: Diagnosis not present

## 2021-06-08 DIAGNOSIS — N1831 Chronic kidney disease, stage 3a: Secondary | ICD-10-CM | POA: Diagnosis not present

## 2021-06-08 DIAGNOSIS — N261 Atrophy of kidney (terminal): Secondary | ICD-10-CM | POA: Diagnosis not present

## 2021-06-08 DIAGNOSIS — E785 Hyperlipidemia, unspecified: Secondary | ICD-10-CM | POA: Diagnosis not present

## 2021-06-09 DIAGNOSIS — R262 Difficulty in walking, not elsewhere classified: Secondary | ICD-10-CM | POA: Diagnosis not present

## 2021-06-09 DIAGNOSIS — G20 Parkinson's disease: Secondary | ICD-10-CM | POA: Diagnosis not present

## 2021-06-11 DIAGNOSIS — G20 Parkinson's disease: Secondary | ICD-10-CM | POA: Diagnosis not present

## 2021-06-11 DIAGNOSIS — R262 Difficulty in walking, not elsewhere classified: Secondary | ICD-10-CM | POA: Diagnosis not present

## 2021-06-18 DIAGNOSIS — G20 Parkinson's disease: Secondary | ICD-10-CM | POA: Diagnosis not present

## 2021-06-18 DIAGNOSIS — R262 Difficulty in walking, not elsewhere classified: Secondary | ICD-10-CM | POA: Diagnosis not present

## 2021-06-25 DIAGNOSIS — G20 Parkinson's disease: Secondary | ICD-10-CM | POA: Diagnosis not present

## 2021-06-25 DIAGNOSIS — R262 Difficulty in walking, not elsewhere classified: Secondary | ICD-10-CM | POA: Diagnosis not present

## 2021-06-30 NOTE — Progress Notes (Signed)
Office Visit    Patient Name: Adrian [name] Date of Encounter: 07/01/2021  Primary Care Provider:  Cassandria Anger, MD Primary Cardiologist:  Sanda Klein, MD  Chief Complaint   71 year old male with a history of elevated coronary calcium score, mild ascending aorta dilation, hypertension, hyperlipidemia, CKD stage III, Parkinson's disease, hypothyroidism, arthritis, and trigeminal neuralgia who presents for follow-up related to dilation of ascending aorta and preoperative cardiac clearance for upcoming right thumb CMC arthroplasty.   Past Medical History    Past Medical History:  Diagnosis Date   Allergic rhinitis    Cancer ([place]) 2016   basal cell skin cancer nose   Cataract    GERD (gastroesophageal reflux disease)    Hyperlipidemia    Hypertension    Hypothyroidism 2010   mild   Hypothyroidism    Trigeminal neuralgia    L.   Vitamin B12 deficiency 2010   Past Surgical History:  Procedure Laterality Date   ARTHRODESIS METATARSALPHALANGEAL JOINT (MTPJ) Right 07/27/2018   Procedure: Right hallux metatarsal phalangeal joint arthrodesis;  Surgeon: Wylene Simmer, MD;  Location: [facility];  Service: Orthopedics;  Laterality: Right;   arthroscopic surgery for chondromalacia Left 1995   CATARACT EXTRACTION W/ INTRAOCULAR LENS  IMPLANT, BILATERAL  2017   COLONOSCOPY     gamma knife surgery for trigeminal neuralgia  2005   inguinal herniorrhahpy  1991   L.   POLYPECTOMY     TONSILLECTOMY  1959   TRIGEMINAL NERVE DECOMPRESSION  2008   Left  Dr. Claybon Jabs    Allergies  Allergies  Allergen Reactions   Tape Rash    Irritation   Elavil [Amitriptyline Hcl]     constipation   Neomy-Bacit-Polymyx-Pramoxine Rash    Itching    Other Rash   Triple Antibiotic [Bacitracin-Neomycin-Polymyxin] Rash   Wound Dressing Adhesive Rash    History of Present Illness    71 year old male with the above past medical history including elevated coronary calcium  score, mild ascending aorta dilation, hypertension, hyperlipidemia, CKD stage III, Parkinson's disease, hypothyroidism, arthritis, and trigeminal neuralgia.  He was initially evaluated by Dr. Sallyanne Kuster in 2021 in the setting of elevated coronary calcium score (631, 80th percentile for age and sex), and dilated ascending aorta seen on CT, measuring 42 mm. Follow-up ETT in January 2022 was negative for ischemia. MRA chest in January 2022 showed aortic root measuring 65mm, mildly dilated ascending thoracic aorta measuring 41 mm, annual imaging recommended.  He does have a history of CKD and follows with nephrology. Renal ultrasound in February 2021 showed chronically atrophied right kidney without evidence of renal artery stenosis, compensatory hypertrophy of left kidney. He was last seen in the office in April 2022 and was doing well overall from a cardiac standpoint.  Repeat MRA chest was scheduled for April 2023.   He presents today for follow-up for preoperative cardiac evaluation for right thumb CMC arthroplasty with double tendon transfer and tight rope suspension and repair as necessary at the request of Dr. Roseanne Kaufman with [facility]. Since his last visit he has done well from a cardiac standpoint.  He exercises regularly denies any symptoms concerning for angina. Overall, he feels well and has no complaints or concerns today.  Home Medications    Current Outpatient Medications  Medication Sig Dispense Refill   carbidopa-levodopa (SINEMET IR) 25-100 MG tablet Take 1 tablet by mouth 4 (four) times daily. 360 tablet 3   Cetirizine HCl (ZYRTEC ALLERGY) 10 MG CAPS  diphenhydrAMINE HCl (BENADRYL ALLERGY PO)      entacapone (COMTAN) 200 MG tablet Take by mouth.     fluticasone (FLONASE) 50 MCG/ACT nasal spray Place into both nostrils daily.     gabapentin (NEURONTIN) 100 MG capsule TAKE 2 CAPSULES BY MOUTH AT BEDTIME 180 capsule 0   halobetasol (ULTRAVATE) 0.05 % cream APPLY CREAM TOPICALLY 2  TIMES DAILY AS NEEDED 45 g 1   levothyroxine (SYNTHROID) 75 MCG tablet Take 1 tablet (75 mcg total) by mouth daily. 90 tablet 3   losartan (COZAAR) 100 MG tablet Take 1 tablet (100 mg total) by mouth daily. 90 tablet 3   Misc Natural Products (TART CHERRY ADVANCED) CAPS Take by mouth daily.     Multiple Vitamins-Minerals (CENTRUM SILVER ADULT 50+ PO) Take 1 tablet by mouth at bedtime.     ofloxacin (OCUFLOX) 0.3 % ophthalmic solution Place 1 drop into the right eye 3 (three) times daily.     Probiotic Product (PROBIOTIC DAILY PO) Take 1 tablet by mouth at bedtime.     rosuvastatin (CRESTOR) 10 MG tablet Take 1 tablet (10 mg total) by mouth daily. 90 tablet 3   sertraline (ZOLOFT) 25 MG tablet Take 1 tablet (25 mg total) by mouth daily. 90 tablet 1   sildenafil (VIAGRA) 100 MG tablet Take 0.5-1 tablets (50-100 mg total) by mouth daily as needed for erectile dysfunction. 12 tablet 5   traZODone (DESYREL) 50 MG tablet TAKE 1/2 TO 1 (ONE-HALF TO ONE) TABLET BY MOUTH AT BEDTIME AS NEEDED FOR  SLEEP 90 tablet 3   TURMERIC PO Take by mouth daily.     VALERIAN ROOT PO Take 1,500 mg by mouth at bedtime.     Zinc 50 MG TABS Take 1 tablet by mouth every morning.     No current facility-administered medications for this visit.     Review of Systems    He denies chest pain, palpitations, dyspnea, pnd, orthopnea, n, v, dizziness, syncope, edema, weight gain, or early satiety. All other systems reviewed and are otherwise negative except as noted above.   Physical Exam    VS:  BP 130/62    Pulse (!) 53    Ht 5\' 10"  (1.778 m)    Wt 201 lb 6.4 oz (91.4 kg)    SpO2 97%    BMI 28.90 kg/m   GEN: Well nourished, well developed, in no acute distress. HEENT: normal. Neck: Supple, no JVD, carotid bruits, or masses. Cardiac: RRR, no murmurs, rubs, or gallops. No clubbing, cyanosis, edema.  Radials/DP/PT 2+ and equal bilaterally.  Respiratory:  Respirations regular and unlabored, clear to auscultation  bilaterally. GI: Soft, nontender, nondistended, BS + x 4. MS: no deformity or atrophy. Skin: warm and dry, no rash. Neuro:  Strength and sensation are intact. Psych: Normal affect.  Accessory Clinical Findings    ECG personally reviewed by me today - Sinus bradycardia, 53 bpm - no acute changes.  Lab Results  Component Value Date   WBC 4.9 07/14/2020   HGB 14.0 07/14/2020   HCT 41.6 07/14/2020   MCV 90.4 07/14/2020   PLT 127.0 (L) 07/14/2020   Lab Results  Component Value Date   CREATININE 1.40 01/15/2021   BUN 24 (H) 01/15/2021   NA 138 01/15/2021   K 4.4 01/15/2021   CL 102 01/15/2021   CO2 28 01/15/2021   Lab Results  Component Value Date   ALT 12 01/15/2021   AST 27 01/15/2021   ALKPHOS 49 01/15/2021  BILITOT 0.6 01/15/2021   Lab Results  Component Value Date   CHOL 108 07/14/2020   HDL 56.90 07/14/2020   LDLCALC 39 07/14/2020   LDLDIRECT 66.0 05/10/2016   TRIG 65.0 07/14/2020   CHOLHDL 2 07/14/2020    Lab Results  Component Value Date   HGBA1C 5.6 06/20/2019    Assessment & Plan    1. Coronary artery calcification: Coronary calcium score of 631 (80th percentile for age and sex) in 07/2019.  Follow-up PTT in January 2021 negative for ischemia. He is active and exercises regularly. Stable with no anginal symptoms. No indication for ischemic evaluation. Continue losartan, Crestor.  2. Dilation of ascending thoracic aorta/aortic root: Incidental finding on prior CT, previously measured 42 mm. MRA chest in January 2022 showed aortic root measuring 35mm, mildly dilated ascending thoracic aorta measuring 41 mm, annual imaging recommended.  BP well controlled, he remains active.  He is due for repeat imaging, will order routine repeat MRA chest today.   3. Sinus bradycardia: Asymptomatic, stable, known history of sinus bradycardia.  EKG today shows sinus bradycardia, 53 bpm.  Likely in the setting of active lifestyle as he is a lifelong cyclist.    4.  Hypertension: BP well controlled. Continue current antihypertensive regimen.   5. Hyperlipidemia: LDL was 39 in 07/2020.  Managed by PCP, continue Crestor.   6. CKD stage III: Creatinine was 1.4 in August 2022.  Stable. Follows with nephrology.  7. Preoperative cardiac clearance: According to the Revised Cardiac Risk Index (RCRI), his Perioperative Risk of Major Cardiac Event is (%): 0.4. His Functional Capacity in METs is: 9.89 according to the Duke Activity Status Index (DASI). He does have a history of stable dilation of ascending aorta.  He is due for repeat imaging, this has been ordered, however, he does not need to have this prior to surgery. Therefore, based on ACC/AHA guidelines, patient would be at acceptable risk for the planned procedure without further cardiovascular testing. I will route this recommendation to the requesting party via Epic fax function.  8. Disposition: Follow-up in 1 year.   Lenna Sciara, NP 07/01/2021, 9:52 AM

## 2021-07-01 ENCOUNTER — Other Ambulatory Visit: Payer: Self-pay

## 2021-07-01 ENCOUNTER — Ambulatory Visit: Payer: Medicare Other | Admitting: Nurse Practitioner

## 2021-07-01 ENCOUNTER — Encounter: Payer: Self-pay | Admitting: Physician Assistant

## 2021-07-01 VITALS — BP 130/62 | HR 53 | Ht 70.0 in | Wt 201.4 lb

## 2021-07-01 DIAGNOSIS — I2510 Atherosclerotic heart disease of native coronary artery without angina pectoris: Secondary | ICD-10-CM

## 2021-07-01 DIAGNOSIS — N1831 Chronic kidney disease, stage 3a: Secondary | ICD-10-CM

## 2021-07-01 DIAGNOSIS — I7121 Aneurysm of the ascending aorta, without rupture: Secondary | ICD-10-CM | POA: Diagnosis not present

## 2021-07-01 DIAGNOSIS — E785 Hyperlipidemia, unspecified: Secondary | ICD-10-CM

## 2021-07-01 DIAGNOSIS — R001 Bradycardia, unspecified: Secondary | ICD-10-CM | POA: Diagnosis not present

## 2021-07-01 DIAGNOSIS — I10 Essential (primary) hypertension: Secondary | ICD-10-CM | POA: Diagnosis not present

## 2021-07-01 DIAGNOSIS — Z01818 Encounter for other preprocedural examination: Secondary | ICD-10-CM | POA: Diagnosis not present

## 2021-07-01 DIAGNOSIS — I2584 Coronary atherosclerosis due to calcified coronary lesion: Secondary | ICD-10-CM

## 2021-07-01 NOTE — Patient Instructions (Addendum)
Medication Instructions:  Your physician recommends that you continue on your current medications as directed. Please refer to the Current Medication list given to you today.  *If you need a refill on your cardiac medications before your next appointment, please call your pharmacy*  Lab Work: NONE ordered at this time of appointment   If you have labs (blood work) drawn today and your tests are completely normal, you will receive your results only by: [place] (if you have MyChart) OR A paper copy in the mail If you have any lab test that is abnormal or we need to change your treatment, we will call you to review the results.  Testing/Procedures: Your physician has ordered a MR Angio of the chest for routine monitoring of your Aorta.  Follow-Up: At [facility], you and your health needs are our priority.  As part of our continuing mission to provide you with exceptional heart care, we have created designated Provider Care Teams.  These Care Teams include your primary Cardiologist (physician) and Advanced Practice Providers (APPs -  Physician Assistants and Nurse Practitioners) who all work together to provide you with the care you need, when you need it.  Your next appointment:   1 year(s)  The format for your next appointment:   In Person  Provider:   Sanda Klein, MD    Other Instructions

## 2021-07-02 DIAGNOSIS — R262 Difficulty in walking, not elsewhere classified: Secondary | ICD-10-CM | POA: Diagnosis not present

## 2021-07-02 DIAGNOSIS — G20 Parkinson's disease: Secondary | ICD-10-CM | POA: Diagnosis not present

## 2021-07-06 ENCOUNTER — Other Ambulatory Visit: Payer: Self-pay | Admitting: Internal Medicine

## 2021-07-06 DIAGNOSIS — R262 Difficulty in walking, not elsewhere classified: Secondary | ICD-10-CM | POA: Diagnosis not present

## 2021-07-06 DIAGNOSIS — G20 Parkinson's disease: Secondary | ICD-10-CM | POA: Diagnosis not present

## 2021-07-07 DIAGNOSIS — M19031 Primary osteoarthritis, right wrist: Secondary | ICD-10-CM | POA: Diagnosis not present

## 2021-07-07 DIAGNOSIS — M1811 Unilateral primary osteoarthritis of first carpometacarpal joint, right hand: Secondary | ICD-10-CM | POA: Diagnosis not present

## 2021-07-07 DIAGNOSIS — G8918 Other acute postprocedural pain: Secondary | ICD-10-CM | POA: Diagnosis not present

## 2021-07-07 HISTORY — PX: THUMB ARTHROSCOPY: SHX2509

## 2021-07-22 DIAGNOSIS — Z4789 Encounter for other orthopedic aftercare: Secondary | ICD-10-CM | POA: Diagnosis not present

## 2021-07-22 DIAGNOSIS — M1811 Unilateral primary osteoarthritis of first carpometacarpal joint, right hand: Secondary | ICD-10-CM | POA: Diagnosis not present

## 2021-07-22 DIAGNOSIS — M79644 Pain in right finger(s): Secondary | ICD-10-CM | POA: Diagnosis not present

## 2021-07-27 ENCOUNTER — Telehealth: Payer: Self-pay | Admitting: Family Medicine

## 2021-07-27 NOTE — Telephone Encounter (Signed)
Pt wife called, Adrian [name] just had thumb surgery with Dr. Amedeo Plenty and is doing great. Wanted to thank you for your help and referral.

## 2021-08-04 ENCOUNTER — Other Ambulatory Visit: Payer: Self-pay | Admitting: Internal Medicine

## 2021-08-05 DIAGNOSIS — Z4789 Encounter for other orthopedic aftercare: Secondary | ICD-10-CM | POA: Diagnosis not present

## 2021-08-05 DIAGNOSIS — M25641 Stiffness of right hand, not elsewhere classified: Secondary | ICD-10-CM | POA: Diagnosis not present

## 2021-08-05 NOTE — Progress Notes (Signed)
? ?I, Adrian [name], LAT, ATC acting as a scribe for Adrian Leader, MD. ? ?Adrian [name] is a 71 y.o. male who presents to [place] at [facility] today for R knee pain. Pt previously saw Dr. Georgina Snell on 04/03/21 for chronic L ankle pain. Today, pt c/o R knee pain x 2-3 weeks. Pt notes he aggravated the R knee when going to a Parkinson's PT class and thinks he overdid it. Pt locates pain to particularly to the posterior aspect, but really all over the R knee joint. ? ?R knee swelling: no ?Mechanical symptoms: yes ?Aggravates: transitioning to stand, 1st thing in the morning, walking, stairs ?Treatments tried: Voltaren gel ? ?Pertinent review of systems: No fevers or chills ? ?Relevant historical information: Hypertension.  Parkinson's disease. ? ? ?Exam:  ?BP 130/78   Pulse (!) 57   Ht '5\' 10"'$  (1.778 m)   Wt 210 lb 3.2 oz (95.3 kg)   SpO2 95%   BMI 30.16 kg/m?  ?General: Well Developed, well nourished, and in no acute distress.  ? ?MSK: Right knee: Mild effusion.  Otherwise normal. ?Normal motion with crepitation. ?Mildly tender palpation posterior knee. ?Normal knee motion with crepitation. ?Intact strength. ?No pain with resisted knee flexion. ?Stable ligamentous exam. ?Mild positive McMurray's test. ? ? ? ?Lab and Radiology Results ? ?Procedure: Real-time Ultrasound Guided Injection of knee superior lateral patellar space ?Device: Philips Affiniti 50G ?Images permanently stored and available for review in PACS ?Ultrasound evaluation prior to injection reveals mild effusion.  Degenerative changes medial joint line. ?No Baker's cyst. ?Verbal informed consent obtained.  Discussed risks and benefits of procedure. Warned about infection bleeding damage to structures skin hypopigmentation and fat atrophy among others. ?Patient expresses understanding and agreement ?Time-out conducted.   ?Noted no overlying erythema, induration, or other signs of local infection.   ?Skin prepped in a sterile  fashion.   ?Local anesthesia: Topical Ethyl chloride.   ?With sterile technique and under real time ultrasound guidance: 40 mg of Kenalog and 2 mL of Marcaine injected into knee joint. Fluid seen entering the joint capsule.   ?Completed without difficulty   ?Pain immediately resolved suggesting accurate placement of the medication.   ?Advised to call if fevers/chills, erythema, induration, drainage, or persistent bleeding.   ?Images permanently stored and available for review in the ultrasound unit.  ?Impression: Technically successful ultrasound guided injection. ? ? ? ?X-ray images right knee obtained today personally and independently interpreted ?Mild medial compartment DJD.  Mild patellofemoral DJD.  No acute fractures. ?Await formal radiology review ? ? ? ?Assessment and Plan: ?71 y.o. male with right knee pain.  Patient effectively I think had an exacerbation of pain related to DJD while doing an exercise class for his Parkinson's disease.  Plan for steroid injection and Voltaren gel and Tylenol.  If needed could refer to PT.  He has an established relationship with [facility] but will be attending [facility] for hand therapy soon.  He will let me know where he would like to go if he does want to do PT. ?If needed in the future could do gel shots or work-up the knee further with MRI. ? ?Recheck in 1 month if not improved. ? ? ?PDMP not reviewed this encounter. ?Orders Placed This Encounter  ?Procedures  ? [place] LIMITED JOINT SPACE STRUCTURES LOW RIGHT(NO LINKED CHARGES)  ?  Order Specific Question:   Reason for Exam (SYMPTOM  OR DIAGNOSIS REQUIRED)  ?  Answer:   right knee  pain  ?  Order Specific Question:   Preferred imaging location?  ?  Answer:   [facility]  ? DG Knee AP/LAT W/[place] Right  ?  Standing Status:   Future  ?  Number of Occurrences:   1  ?  Standing Expiration Date:   09/06/2021  ?  Order Specific Question:   Reason for Exam (SYMPTOM  OR DIAGNOSIS  REQUIRED)  ?  Answer:   right knee pain  ?  Order Specific Question:   Preferred imaging location?  ?  Answer:   [facility]  ? ?No orders of the defined types were placed in this encounter. ? ? ? ?Discussed warning signs or symptoms. Please see discharge instructions. Patient expresses understanding. ? ? ?The above documentation has been reviewed and is accurate and complete Adrian [name], M.D. ? ? ?

## 2021-08-06 ENCOUNTER — Ambulatory Visit: Payer: Medicare Other | Admitting: Family Medicine

## 2021-08-06 ENCOUNTER — Ambulatory Visit: Payer: Self-pay

## 2021-08-06 ENCOUNTER — Other Ambulatory Visit: Payer: Self-pay

## 2021-08-06 ENCOUNTER — Ambulatory Visit (INDEPENDENT_AMBULATORY_CARE_PROVIDER_SITE_OTHER): Payer: Medicare Other

## 2021-08-06 VITALS — BP 130/78 | HR 57 | Ht 70.0 in | Wt 210.2 lb

## 2021-08-06 DIAGNOSIS — R600 Localized edema: Secondary | ICD-10-CM | POA: Diagnosis not present

## 2021-08-06 DIAGNOSIS — M25561 Pain in right knee: Secondary | ICD-10-CM

## 2021-08-06 NOTE — Patient Instructions (Addendum)
Thank you for coming in today.  ? ?Please get an Xray today before you leave  ? ?You received a steroid injection in your right knee today. Seek immediate medical attention if the joint becomes red, extremely painful, or is oozing fluid.  ? ?Recheck back 1 month if not getting better ?

## 2021-08-07 ENCOUNTER — Encounter: Payer: Self-pay | Admitting: Family Medicine

## 2021-08-07 DIAGNOSIS — M1811 Unilateral primary osteoarthritis of first carpometacarpal joint, right hand: Secondary | ICD-10-CM | POA: Diagnosis not present

## 2021-08-07 NOTE — Progress Notes (Signed)
Right knee x-ray shows mild arthritis changes

## 2021-08-10 MED ORDER — COLCHICINE 0.6 MG PO TABS: 0.6000 mg | ORAL_TABLET | Freq: Every day | ORAL | 2 refills | Status: DC | PRN

## 2021-08-12 DIAGNOSIS — M1811 Unilateral primary osteoarthritis of first carpometacarpal joint, right hand: Secondary | ICD-10-CM | POA: Diagnosis not present

## 2021-08-18 DIAGNOSIS — M1811 Unilateral primary osteoarthritis of first carpometacarpal joint, right hand: Secondary | ICD-10-CM | POA: Diagnosis not present

## 2021-08-24 ENCOUNTER — Ambulatory Visit (INDEPENDENT_AMBULATORY_CARE_PROVIDER_SITE_OTHER): Payer: Medicare Other | Admitting: Internal Medicine

## 2021-08-24 ENCOUNTER — Encounter: Payer: Self-pay | Admitting: Internal Medicine

## 2021-08-24 ENCOUNTER — Other Ambulatory Visit: Payer: Self-pay

## 2021-08-24 DIAGNOSIS — Z0000 Encounter for general adult medical examination without abnormal findings: Secondary | ICD-10-CM | POA: Diagnosis not present

## 2021-08-24 DIAGNOSIS — G20 Parkinson's disease: Secondary | ICD-10-CM

## 2021-08-24 LAB — URINALYSIS
Bilirubin Urine: NEGATIVE
Hgb urine dipstick: NEGATIVE
Ketones, ur: NEGATIVE
Leukocytes,Ua: NEGATIVE
Nitrite: NEGATIVE
Specific Gravity, Urine: 1.02 (ref 1.000–1.030)
Total Protein, Urine: NEGATIVE
Urine Glucose: NEGATIVE
Urobilinogen, UA: 0.2 (ref 0.0–1.0)
pH: 6 (ref 5.0–8.0)

## 2021-08-24 LAB — CBC WITH DIFFERENTIAL/PLATELET
Basophils Absolute: 0 10*3/uL (ref 0.0–0.1)
Basophils Relative: 0.6 % (ref 0.0–3.0)
Eosinophils Absolute: 0.2 10*3/uL (ref 0.0–0.7)
Eosinophils Relative: 4.2 % (ref 0.0–5.0)
HCT: 42.2 % (ref 39.0–52.0)
Hemoglobin: 14.1 g/dL (ref 13.0–17.0)
Lymphocytes Relative: 27.3 % (ref 12.0–46.0)
Lymphs Abs: 1.5 10*3/uL (ref 0.7–4.0)
MCHC: 33.5 g/dL (ref 30.0–36.0)
MCV: 91.6 fl (ref 78.0–100.0)
Monocytes Absolute: 0.5 10*3/uL (ref 0.1–1.0)
Monocytes Relative: 10.1 % (ref 3.0–12.0)
Neutro Abs: 3.1 10*3/uL (ref 1.4–7.7)
Neutrophils Relative %: 57.8 % (ref 43.0–77.0)
Platelets: 151 10*3/uL (ref 150.0–400.0)
RBC: 4.61 Mil/uL (ref 4.22–5.81)
RDW: 14.3 % (ref 11.5–15.5)
WBC: 5.3 10*3/uL (ref 4.0–10.5)

## 2021-08-24 LAB — LIPID PANEL
Cholesterol: 117 mg/dL (ref 0–200)
HDL: 55.5 mg/dL (ref >39.00)
LDL Cholesterol: 35 mg/dL (ref 0–99)
NonHDL: 61.2
Total CHOL/HDL Ratio: 2
Triglycerides: 133 mg/dL (ref 0.0–149.0)
VLDL: 26.6 mg/dL (ref 0.0–40.0)

## 2021-08-24 LAB — COMPREHENSIVE METABOLIC PANEL
ALT: 6 U/L (ref 0–53)
AST: 16 U/L (ref 0–37)
Albumin: 4.7 g/dL (ref 3.5–5.2)
Alkaline Phosphatase: 40 U/L (ref 39–117)
BUN: 22 mg/dL (ref 6–23)
CO2: 30 mEq/L (ref 19–32)
Calcium: 10 mg/dL (ref 8.4–10.5)
Chloride: 103 mEq/L (ref 96–112)
Creatinine, Ser: 1.33 mg/dL (ref 0.40–1.50)
GFR: 54.11 mL/min — ABNORMAL LOW (ref >60.00)
Glucose, Bld: 95 mg/dL (ref 70–99)
Potassium: 4.6 mEq/L (ref 3.5–5.1)
Sodium: 139 mEq/L (ref 135–145)
Total Bilirubin: 0.6 mg/dL (ref 0.2–1.2)
Total Protein: 6.9 g/dL (ref 6.0–8.3)

## 2021-08-24 MED ORDER — TRAZODONE HCL 50 MG PO TABS: 50.0000 mg | ORAL_TABLET | Freq: Every day | ORAL | 3 refills | Status: DC

## 2021-08-24 MED ORDER — GABAPENTIN 100 MG PO CAPS: 200.0000 mg | ORAL_CAPSULE | Freq: Every day | ORAL | 3 refills | Status: DC

## 2021-08-24 MED ORDER — LOSARTAN POTASSIUM 100 MG PO TABS: 100.0000 mg | ORAL_TABLET | Freq: Every day | ORAL | 3 refills | Status: DC

## 2021-08-24 MED ORDER — LEVOTHYROXINE SODIUM 75 MCG PO TABS
75.0000 ug | ORAL_TABLET | Freq: Every day | ORAL | 3 refills | Status: DC
Start: 2021-08-24 — End: 2022-06-04

## 2021-08-24 NOTE — Assessment & Plan Note (Signed)
In PT 

## 2021-08-24 NOTE — Assessment & Plan Note (Addendum)
?  We discussed age appropriate health related issues, including available/recomended screening tests and vaccinations. Labs were ordered to be later reviewed . All questions were answered. We discussed one or more of the following - seat belt use, use of sunscreen/sun exposure exercise, safe sex, fall risk reduction, second hand smoke exposure, firearm use and storage, seat belt use, a need for adhering to healthy diet and exercise. ?Labs were ordered.  All questions were answered. ? ?Colon 2017, due in 2023 - Dr Fuller Plan ?2/20  cardiac CT scan for calcium score is 631 ?tDAP at the pharmacy ?

## 2021-08-24 NOTE — Progress Notes (Signed)
? ?Subjective:  ?Patient ID: Adrian [name], male    DOB: 1950-09-02  Age: 71 y.o. MRN: [id number] ? ?CC: No chief complaint on file. ? ? ?HPI ?Adrian [name] presents for a well exam ? ?Outpatient Medications Prior to Visit  ?Medication Sig Dispense Refill  ? carbidopa-levodopa (SINEMET IR) 25-100 MG tablet Take 1 tablet by mouth 4 (four) times daily. 360 tablet 3  ? Cetirizine HCl (ZYRTEC ALLERGY) 10 MG CAPS     ? colchicine 0.6 MG tablet Take 1 tablet (0.6 mg total) by mouth daily as needed (gout or psuedogout pain). 30 tablet 2  ? diphenhydrAMINE HCl (BENADRYL ALLERGY PO)     ? entacapone (COMTAN) 200 MG tablet Take by mouth.    ? gabapentin (NEURONTIN) 100 MG capsule TAKE 2 CAPSULES BY MOUTH AT BEDTIME 180 capsule 0  ? halobetasol (ULTRAVATE) 0.05 % cream APPLY CREAM TOPICALLY 2 TIMES DAILY AS NEEDED 45 g 1  ? levothyroxine (SYNTHROID) 75 MCG tablet Take 1 tablet (75 mcg total) by mouth daily. 90 tablet 3  ? losartan (COZAAR) 100 MG tablet Take 1 tablet by mouth once daily 90 tablet 0  ? Misc Natural Products (TART CHERRY ADVANCED) CAPS Take by mouth daily.    ? Multiple Vitamins-Minerals (CENTRUM SILVER ADULT 50+ PO) Take 1 tablet by mouth at bedtime.    ? ofloxacin (OCUFLOX) 0.3 % ophthalmic solution Place 1 drop into the right eye 3 (three) times daily.    ? Probiotic Product (PROBIOTIC DAILY PO) Take 1 tablet by mouth at bedtime.    ? rosuvastatin (CRESTOR) 10 MG tablet Take 1 tablet by mouth once daily 90 tablet 0  ? sertraline (ZOLOFT) 25 MG tablet Take 1 tablet (25 mg total) by mouth daily. 90 tablet 1  ? sildenafil (VIAGRA) 100 MG tablet Take 0.5-1 tablets (50-100 mg total) by mouth daily as needed for erectile dysfunction. 12 tablet 5  ? traZODone (DESYREL) 50 MG tablet TAKE 1/2 TO 1 (ONE-HALF TO ONE) TABLET BY MOUTH AT BEDTIME AS NEEDED FOR  SLEEP 90 tablet 3  ? TURMERIC PO Take by mouth daily.    ? VALERIAN ROOT PO Take 1,500 mg by mouth at bedtime.    ? Zinc 50 MG TABS Take 1 tablet by mouth every  morning.    ? fluticasone (FLONASE) 50 MCG/ACT nasal spray Place into both nostrils daily.    ? ?No facility-administered medications prior to visit.  ? ? ?ROS: ?Review of Systems  ?Constitutional:  Negative for appetite change, fatigue and unexpected weight change.  ?HENT:  Negative for congestion, nosebleeds, sneezing, sore throat and trouble swallowing.   ?Eyes:  Negative for itching and visual disturbance.  ?Respiratory:  Negative for cough.   ?Cardiovascular:  Negative for chest pain, palpitations and leg swelling.  ?Gastrointestinal:  Negative for abdominal distention, blood in stool, diarrhea and nausea.  ?Genitourinary:  Negative for frequency and hematuria.  ?Musculoskeletal:  Positive for arthralgias and gait problem. Negative for back pain, joint swelling and neck pain.  ?Skin:  Negative for rash.  ?Neurological:  Positive for tremors. Negative for dizziness, speech difficulty and weakness.  ?Psychiatric/Behavioral:  Negative for agitation, dysphoric mood and sleep disturbance. The patient is not nervous/anxious.   ? ?Objective:  ?BP 112/62 (BP Location: Left Arm, Patient Position: Sitting, Cuff Size: Large)   Pulse (!) 53   Temp 98.2 ?F (36.8 ?C) (Oral)   Ht '5\' 10"'$  (1.778 m)   Wt 204 lb (92.5 kg)   SpO2 93%  BMI 29.27 kg/m?  ? ?BP Readings from Last 3 Encounters:  ?08/24/21 112/62  ?08/06/21 130/78  ?07/01/21 130/62  ? ? ?Wt Readings from Last 3 Encounters:  ?08/24/21 204 lb (92.5 kg)  ?08/06/21 210 lb 3.2 oz (95.3 kg)  ?07/01/21 201 lb 6.4 oz (91.4 kg)  ? ? ?Physical Exam ?Constitutional:   ?   General: He is not in acute distress. ?   Appearance: He is well-developed. He is obese.  ?   Comments: NAD  ?Eyes:  ?   Conjunctiva/sclera: Conjunctivae normal.  ?   Pupils: Pupils are equal, round, and reactive to light.  ?Neck:  ?   Thyroid: No thyromegaly.  ?   Vascular: No JVD.  ?Cardiovascular:  ?   Rate and Rhythm: Normal rate and regular rhythm.  ?   Heart sounds: Normal heart sounds. No murmur  heard. ?  No friction rub. No gallop.  ?Pulmonary:  ?   Effort: Pulmonary effort is normal. No respiratory distress.  ?   Breath sounds: Normal breath sounds. No wheezing or rales.  ?Chest:  ?   Chest wall: No tenderness.  ?Abdominal:  ?   General: Bowel sounds are normal. There is no distension.  ?   Palpations: Abdomen is soft. There is no mass.  ?   Tenderness: There is no abdominal tenderness. There is no guarding or rebound.  ?Musculoskeletal:     ?   General: No tenderness. Normal range of motion.  ?   Cervical back: Normal range of motion.  ?Lymphadenopathy:  ?   Cervical: No cervical adenopathy.  ?Skin: ?   General: Skin is warm and dry.  ?   Findings: No rash.  ?Neurological:  ?   Mental Status: He is alert and oriented to person, place, and time.  ?   Cranial Nerves: No cranial nerve deficit.  ?   Motor: No abnormal muscle tone.  ?   Coordination: Coordination normal.  ?   Gait: Gait normal.  ?   Deep Tendon Reflexes: Reflexes are normal and symmetric.  ?Psychiatric:     ?   Behavior: Behavior normal.     ?   Thought Content: Thought content normal.     ?   Judgment: Judgment normal.  ? ?Rectal - per GI ?Colon due in 2023 - Dr Fuller Plan ? ?Lab Results  ?Component Value Date  ? WBC 4.9 07/14/2020  ? HGB 14.0 07/14/2020  ? HCT 41.6 07/14/2020  ? PLT 127.0 (L) 07/14/2020  ? GLUCOSE 93 01/15/2021  ? CHOL 108 07/14/2020  ? TRIG 65.0 07/14/2020  ? HDL 56.90 07/14/2020  ? LDLDIRECT 66.0 05/10/2016  ? LDLCALC 39 07/14/2020  ? ALT 12 01/15/2021  ? AST 27 01/15/2021  ? NA 138 01/15/2021  ? K 4.4 01/15/2021  ? CL 102 01/15/2021  ? CREATININE 1.40 01/15/2021  ? BUN 24 (H) 01/15/2021  ? CO2 28 01/15/2021  ? TSH 3.47 07/14/2020  ? PSA 0.96 07/14/2020  ? INR 1.0 RATIO 09/18/2007  ? HGBA1C 5.6 06/20/2019  ? ? ?MR Angiogram Chest W Wo Contrast ? ?Result Date: 06/18/2020 ?CLINICAL DATA:  Mild aneurysmal dilatation of the ascending thoracic aorta detected by prior unenhanced coronary calcium score study. EXAM: MRA CHEST WITH  CONTRAST TECHNIQUE: Angiographic images of the chest were obtained using MRA technique with intravenous contrast. CONTRAST:  67m GADAVIST GADOBUTROL 1 MMOL/ML IV SOLN COMPARISON:  Coronary calcium score CT on 07/10/2019 FINDINGS: VASCULAR Aorta: The aortic root measures approximately 3.9 cm  at the level of the sinuses of Valsalva. The ascending thoracic aorta is mildly dilated and measures approximately 4.1 cm in maximum diameter. The aortic arch measures 3.1-3.2 cm. The descending thoracic aorta measures 2.8 cm. There is no evidence of aortic dissection. Visualized proximal great vessels demonstrate normal patency and normal branching anatomy. Heart: The heart size is within normal limits. No pericardial fluid identified. Pulmonary Arteries: Central pulmonary arteries are normal in caliber. NON-VASCULAR No pleural fluid or pulmonary abnormalities identified. No evidence of lymphadenopathy in the chest. Visualized upper abdomen demonstrates a cyst in the left lobe of the liver measuring up to 6 cm in maximum diameter. Visualized bony structures are unremarkable. 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 Recommend annual imaging followup by CTA or MRA. This recommendation follows 2010 ACCF/AHA/AATS/ACR/ASA/SCA/SCAI/SIR/STS/SVM Guidelines for the Diagnosis and Management of Patients with Thoracic Aortic Disease. Circulation. 2010; 121: Y814-G818. Aortic aneurysm NOS (ICD10-I71.9) Electronically Signed   By: Aletta Edouard M.D.   On: 06/18/2020 12:55  ? ?EXERCISE TOLERANCE TEST (ETT) ? ?Result Date: 06/18/2020 ?? There was no ST segment deviation noted during stress. ? No T wave inversion was noted during stress.  1. Negative ECG stress test for ischemia but only achieved 84% MPHR. Cannot exclude ischemia at higher heart rate. 2. Average exercise capacity (6:20 min:s; 7.0 METS). 3. Normal HR/BP response to exercise. 4. 84% MPHR achieved as  noted above.  ? ? ?Assessment & Plan:  ? ?Problem List Items Addressed This Visit   ? ? Well adult exam  ?   ?We discussed age appropriate health related issues, including available/recomended screening tests and va

## 2021-08-25 DIAGNOSIS — M1811 Unilateral primary osteoarthritis of first carpometacarpal joint, right hand: Secondary | ICD-10-CM | POA: Diagnosis not present

## 2021-08-25 LAB — PSA: PSA: 1.64 ng/mL (ref 0.10–4.00)

## 2021-08-25 LAB — TSH: TSH: 4.07 u[IU]/mL (ref 0.35–5.50)

## 2021-08-26 ENCOUNTER — Encounter: Payer: Self-pay | Admitting: Gastroenterology

## 2021-08-26 ENCOUNTER — Other Ambulatory Visit: Payer: Self-pay

## 2021-08-26 ENCOUNTER — Ambulatory Visit (HOSPITAL_COMMUNITY)
Admission: RE | Admit: 2021-08-26 | Discharge: 2021-08-26 | Disposition: A | Discharge status: Home / Self Care | Payer: Medicare Other | Source: Ambulatory Visit | Attending: Nurse Practitioner | Admitting: Nurse Practitioner

## 2021-08-26 DIAGNOSIS — I7121 Aneurysm of the ascending aorta, without rupture: Secondary | ICD-10-CM | POA: Diagnosis not present

## 2021-08-26 MED ORDER — GADOBUTROL 1 MMOL/ML IV SOLN
9.5000 mL | Freq: Once | INTRAVENOUS | Status: AC | PRN
  Administered 2021-08-26: 9.5 mL via INTRAVENOUS

## 2021-08-27 DIAGNOSIS — M1811 Unilateral primary osteoarthritis of first carpometacarpal joint, right hand: Secondary | ICD-10-CM | POA: Diagnosis not present

## 2021-08-28 ENCOUNTER — Telehealth: Payer: Self-pay

## 2021-08-28 NOTE — Telephone Encounter (Signed)
Spoke with pt. Pt was notified of results and recommendations of a repeat scan in 1 year. Pt stated he had a scan in 2022 as well. Please advise. Pt is aware that the scan in 2021 and 2023 was unchanged per Diona Browner NP. Will call pt back when this message is answered by Diona Browner, NP.  ?

## 2021-08-31 DIAGNOSIS — M1811 Unilateral primary osteoarthritis of first carpometacarpal joint, right hand: Secondary | ICD-10-CM | POA: Diagnosis not present

## 2021-09-02 DIAGNOSIS — M1811 Unilateral primary osteoarthritis of first carpometacarpal joint, right hand: Secondary | ICD-10-CM | POA: Diagnosis not present

## 2021-09-03 DIAGNOSIS — Z4789 Encounter for other orthopedic aftercare: Secondary | ICD-10-CM | POA: Diagnosis not present

## 2021-09-07 ENCOUNTER — Ambulatory Visit: Payer: Medicare Other | Admitting: Family Medicine

## 2021-09-08 DIAGNOSIS — M1811 Unilateral primary osteoarthritis of first carpometacarpal joint, right hand: Secondary | ICD-10-CM | POA: Diagnosis not present

## 2021-09-14 DIAGNOSIS — M1811 Unilateral primary osteoarthritis of first carpometacarpal joint, right hand: Secondary | ICD-10-CM | POA: Diagnosis not present

## 2021-09-17 DIAGNOSIS — M1811 Unilateral primary osteoarthritis of first carpometacarpal joint, right hand: Secondary | ICD-10-CM | POA: Diagnosis not present

## 2021-09-23 DIAGNOSIS — M1811 Unilateral primary osteoarthritis of first carpometacarpal joint, right hand: Secondary | ICD-10-CM | POA: Diagnosis not present

## 2021-09-25 DIAGNOSIS — M1811 Unilateral primary osteoarthritis of first carpometacarpal joint, right hand: Secondary | ICD-10-CM | POA: Diagnosis not present

## 2021-09-30 DIAGNOSIS — M1811 Unilateral primary osteoarthritis of first carpometacarpal joint, right hand: Secondary | ICD-10-CM | POA: Diagnosis not present

## 2021-10-07 DIAGNOSIS — M1811 Unilateral primary osteoarthritis of first carpometacarpal joint, right hand: Secondary | ICD-10-CM | POA: Diagnosis not present

## 2021-10-09 ENCOUNTER — Other Ambulatory Visit: Payer: Self-pay | Admitting: Internal Medicine

## 2021-10-15 ENCOUNTER — Ambulatory Visit (AMBULATORY_SURGERY_CENTER): Payer: Medicare Other | Admitting: *Deleted

## 2021-10-15 VITALS — Ht 70.0 in | Wt 204.0 lb

## 2021-10-15 DIAGNOSIS — Z86010 Personal history of colonic polyps: Secondary | ICD-10-CM

## 2021-10-15 DIAGNOSIS — Z4789 Encounter for other orthopedic aftercare: Secondary | ICD-10-CM | POA: Diagnosis not present

## 2021-10-15 DIAGNOSIS — M79644 Pain in right finger(s): Secondary | ICD-10-CM | POA: Diagnosis not present

## 2021-10-15 DIAGNOSIS — M1811 Unilateral primary osteoarthritis of first carpometacarpal joint, right hand: Secondary | ICD-10-CM | POA: Diagnosis not present

## 2021-10-15 MED ORDER — NA SULFATE-K SULFATE-MG SULF 17.5-3.13-1.6 GM/177ML PO SOLN: 1.0000 | ORAL | 0 refills | Status: DC

## 2021-10-15 NOTE — Progress Notes (Signed)
Patient's pre-visit was done today over the phone with the patient. Name,DOB and address verified. Patient denies any allergies to Eggs and Soy. Patient denies any problems with anesthesia/sedation. Patient is not taking any diet pills or blood thinners. No home Oxygen. Insurance confirmed with patient.  Prep instructions sent to pt's MyChart (if available) -pt is aware. Patient understands to call us back with any questions or concerns. Patient is aware of our care-partner policy.   EMMI education assigned to the patient for the procedure, sent to [facility].   The patient is COVID-19 vaccinated.

## 2021-10-27 ENCOUNTER — Telehealth: Payer: Self-pay | Admitting: Gastroenterology

## 2021-10-27 NOTE — Telephone Encounter (Signed)
Patient notified of Dr.Dorsey's recommendation to proceed with colonoscopy as scheduled. Encouraged patient to call us back if he has any "new symptoms" and explained not to Imodium 2 days prior to procedure. Pt verbalizes understanding and he wants to proceed with colon as scheduled.

## 2021-10-27 NOTE — Telephone Encounter (Signed)
Dr.Dorsey (doc of day),  This patient is scheduled for recall colonoscopy with Dr.Stark on 6/6. He called asking if he should have the procedure. He explains he has been having "loose stools for 1 month", they are very loose for couple of days then they firm up some and then get loose again. He denies any fevers, blood in stool, or abd pain. He then states his wife 1 week ago begin having the loose stool and she went to PCP who gave her antibiotics but that did not help her. He has been taking Imodium which helps.   Please advise. Okay to proceed with colonoscopy on 6/6? Thank you, [name] PV

## 2021-10-27 NOTE — Telephone Encounter (Signed)
Inbound call from patient stating he has been having loose stool issues here recently and is wanting to know if he needs to cancel his procedure on 6/6 or if he can keep the appointment. Please advise.

## 2021-10-28 ENCOUNTER — Other Ambulatory Visit: Payer: Self-pay

## 2021-10-28 ENCOUNTER — Emergency Department (HOSPITAL_BASED_OUTPATIENT_CLINIC_OR_DEPARTMENT_OTHER)
Admission: EM | Admit: 2021-10-28 | Discharge: 2021-10-28 | Disposition: A | Discharge status: Home / Self Care | Payer: Medicare Other | Attending: Emergency Medicine | Admitting: Emergency Medicine

## 2021-10-28 ENCOUNTER — Encounter (HOSPITAL_BASED_OUTPATIENT_CLINIC_OR_DEPARTMENT_OTHER): Payer: Self-pay

## 2021-10-28 DIAGNOSIS — R197 Diarrhea, unspecified: Secondary | ICD-10-CM | POA: Diagnosis not present

## 2021-10-28 DIAGNOSIS — Z7982 Long term (current) use of aspirin: Secondary | ICD-10-CM | POA: Insufficient documentation

## 2021-10-28 LAB — CBC WITH DIFFERENTIAL/PLATELET
Abs Immature Granulocytes: 0.02 10*3/uL (ref 0.00–0.07)
Basophils Absolute: 0 10*3/uL (ref 0.0–0.1)
Basophils Relative: 0 %
Eosinophils Absolute: 0.1 10*3/uL (ref 0.0–0.5)
Eosinophils Relative: 3 %
HCT: 44.1 % (ref 39.0–52.0)
Hemoglobin: 14.6 g/dL (ref 13.0–17.0)
Immature Granulocytes: 0 %
Lymphocytes Relative: 20 %
Lymphs Abs: 1 10*3/uL (ref 0.7–4.0)
MCH: 29.7 pg (ref 26.0–34.0)
MCHC: 33.1 g/dL (ref 30.0–36.0)
MCV: 89.8 fL (ref 80.0–100.0)
Monocytes Absolute: 0.4 10*3/uL (ref 0.1–1.0)
Monocytes Relative: 9 %
Neutro Abs: 3.3 10*3/uL (ref 1.7–7.7)
Neutrophils Relative %: 68 %
Platelets: 166 10*3/uL (ref 150–400)
RBC: 4.91 MIL/uL (ref 4.22–5.81)
RDW: 13.3 % (ref 11.5–15.5)
WBC: 4.9 10*3/uL (ref 4.0–10.5)
nRBC: 0 % (ref 0.0–0.2)

## 2021-10-28 LAB — BASIC METABOLIC PANEL
Anion gap: 9 (ref 5–15)
BUN: 14 mg/dL (ref 8–23)
CO2: 25 mmol/L (ref 22–32)
Calcium: 9.7 mg/dL (ref 8.9–10.3)
Chloride: 107 mmol/L (ref 98–111)
Creatinine, Ser: 1.36 mg/dL — ABNORMAL HIGH (ref 0.61–1.24)
GFR, Estimated: 56 mL/min — ABNORMAL LOW (ref >60)
Glucose, Bld: 102 mg/dL — ABNORMAL HIGH (ref 70–99)
Potassium: 4 mmol/L (ref 3.5–5.1)
Sodium: 141 mmol/L (ref 135–145)

## 2021-10-28 NOTE — ED Provider Notes (Signed)
[facility] EMERGENCY DEPT Provider Note   CSN: [id number] Arrival date & time: 10/28/21  1022     History  Chief Complaint  Patient presents with   Diarrhea    Adrian [name] is a 71 y.o. male.  Presents chief complaint of diarrhea.  He states he had loose stools for about a month.  Typically now has been having about 2-3 stools a day.  Denies any abdominal pain denies any vomiting denies any fever cough.  His wife also is being seen in the ER with similar symptoms but more severe.      Home Medications Prior to Admission medications   Medication Sig Start Date End Date Taking? Authorizing Provider  aspirin EC 81 MG tablet Take 81 mg by mouth daily. Swallow whole.    [provider]  carbidopa-levodopa (SINEMET IR) 25-100 MG tablet Take 1 tablet by mouth 4 (four) times daily. Patient taking differently: Take 2 tablets by mouth 4 (four) times daily. 07/14/20   Plotnikov, Evie Lacks, MD  Cetirizine HCl (ZYRTEC ALLERGY) 10 MG CAPS  01/30/19   [provider]  colchicine 0.6 MG tablet Take 1 tablet (0.6 mg total) by mouth daily as needed (gout or psuedogout pain). 08/10/21   Gregor Hams, MD  diphenhydrAMINE HCl (BENADRYL ALLERGY PO)  02/25/19   [provider]  entacapone (COMTAN) 200 MG tablet Take 200 mg by mouth 4 (four) times daily. 06/17/21 09/29/23  [provider]  gabapentin (NEURONTIN) 100 MG capsule Take 2 capsules (200 mg total) by mouth at bedtime. 08/24/21   Plotnikov, Evie Lacks, MD  halobetasol (ULTRAVATE) 0.05 % cream APPLY CREAM TOPICALLY 2 TIMES DAILY AS NEEDED 05/29/20   Plotnikov, Evie Lacks, MD  levothyroxine (SYNTHROID) 75 MCG tablet Take 1 tablet (75 mcg total) by mouth daily. 08/24/21   Plotnikov, Evie Lacks, MD  losartan (COZAAR) 100 MG tablet Take 1 tablet (100 mg total) by mouth daily. 08/24/21   Plotnikov, Evie Lacks, MD  Misc Natural Products [facility] ADVANCED) CAPS Take by mouth daily.    [provider]   Multiple Vitamins-Minerals (CENTRUM SILVER ADULT 50+ PO) Take 1 tablet by mouth at bedtime.    [provider]  Na Sulfate-K Sulfate-Mg Sulf 17.5-3.13-1.6 GM/177ML SOLN Take 1 kit by mouth as directed. May use generic SUPREP;NO prior authorizations will be done.Please use Singlecare or GOOD-RX coupon. 10/15/21   Ladene Artist, MD  ofloxacin (OCUFLOX) 0.3 % ophthalmic solution Place 1 drop into the right eye 3 (three) times daily. 11/05/19   [provider]  Probiotic Product (PROBIOTIC DAILY PO) Take 1 tablet by mouth at bedtime.    [provider]  rosuvastatin (CRESTOR) 10 MG tablet Take 1 tablet by mouth once daily 10/09/21   Plotnikov, Evie Lacks, MD  sertraline (ZOLOFT) 25 MG tablet Take 1 tablet (25 mg total) by mouth daily. 07/14/20   Plotnikov, Evie Lacks, MD  sildenafil (VIAGRA) 100 MG tablet Take 0.5-1 tablets (50-100 mg total) by mouth daily as needed for erectile dysfunction. 07/14/20   Plotnikov, Evie Lacks, MD  traZODone (DESYREL) 50 MG tablet Take 1 tablet (50 mg total) by mouth at bedtime. 08/24/21   Plotnikov, Evie Lacks, MD  TURMERIC PO Take by mouth daily.    [provider]  VALERIAN ROOT PO Take 1,500 mg by mouth at bedtime.    [provider]  Zinc 50 MG TABS Take 1 tablet by mouth every morning.    [provider]  Allergies    Tape, Elavil [amitriptyline hcl], Neomy-bacit-polymyx-pramoxine, Other, Triple antibiotic [bacitracin-neomycin-polymyxin], and Wound dressing adhesive    Review of Systems   Review of Systems  Constitutional:  Negative for fever.  HENT:  Negative for ear pain and sore throat.   Eyes:  Negative for pain.  Respiratory:  Negative for cough.   Cardiovascular:  Negative for chest pain.  Gastrointestinal:  Negative for abdominal pain.  Genitourinary:  Negative for flank pain.  Musculoskeletal:  Negative for back pain.  Skin:  Negative for color change and rash.  Neurological:  Negative for syncope.   All other systems reviewed and are negative.  Physical Exam Updated Vital Signs BP 107/78   Pulse (!) 58   Temp 98.3 F (36.8 C)   Resp 18   Ht $R'5\' 10"'vb$  (1.778 m)   Wt 90.7 kg   SpO2 98%   BMI 28.70 kg/m  Physical Exam Constitutional:      Appearance: He is well-developed.  HENT:     Head: Normocephalic.     Nose: Nose normal.  Eyes:     Extraocular Movements: Extraocular movements intact.  Cardiovascular:     Rate and Rhythm: Normal rate.  Pulmonary:     Effort: Pulmonary effort is normal.  Abdominal:     Tenderness: There is no abdominal tenderness. There is no guarding or rebound.  Skin:    Coloration: Skin is not jaundiced.  Neurological:     Mental Status: He is alert. Mental status is at baseline.    ED Results / Procedures / Treatments   Labs (all labs ordered are listed, but only abnormal results are displayed) Labs Reviewed  BASIC METABOLIC PANEL - Abnormal; Notable for the following components:      Result Value   Glucose, Bld 102 (*)    Creatinine, Ser 1.36 (*)    GFR, Estimated 56 (*)    All other components within normal limits  CBC WITH DIFFERENTIAL/PLATELET    EKG None  Radiology No results found.  Procedures Procedures    Medications Ordered in ED Medications - No data to display  ED Course/ Medical Decision Making/ A&P                           Medical Decision Making Amount and/or Complexity of Data Reviewed Labs: ordered.   History obtained from family at bedside.  Chart review shows outpatient visit Oct 15, 2021 for clinical support.  Diagnosis is included labs CBC chemistry which were unremarkable normal white count normal chemistry noted.  On exam patient has no guarding no tenderness benign exam.  His diarrhea appears mild at 2-3 episodes a day.  He describes it more as soft rather than watery.  We will recommend outpatient follow-up with his gastroenterologist in 1 week.  Advising immediate return for worsening  symptoms or any additional concerns.         Final Clinical Impression(s) / ED Diagnoses Final diagnoses:  Diarrhea, unspecified type    Rx / DC Orders ED Discharge Orders     None         Luna Fuse, MD 10/28/21 1433

## 2021-10-28 NOTE — Discharge Instructions (Addendum)
Blood test today were normal.  Follow-up with your gastroenterologist within 1 week.  Return back to the ER if you have fevers worsening symptoms or cannot keep down any fluids.

## 2021-10-28 NOTE — Telephone Encounter (Signed)
Agree, OK to proceed with colonoscopy. Imodium prn however hold Imodium for 2 days prior to his bowel prep through the colonoscopy.

## 2021-10-28 NOTE — ED Triage Notes (Signed)
Pt. States  he has been having soft stool and diarrhea for about a month. States he has an appointment next week for colonoscopy.  Denies fever and nausea .Marland Kitchen

## 2021-10-29 ENCOUNTER — Other Ambulatory Visit: Payer: Self-pay | Admitting: Internal Medicine

## 2021-11-02 ENCOUNTER — Encounter: Payer: Self-pay | Admitting: Gastroenterology

## 2021-11-03 ENCOUNTER — Encounter: Payer: Self-pay | Admitting: Gastroenterology

## 2021-11-03 ENCOUNTER — Ambulatory Visit (AMBULATORY_SURGERY_CENTER): Payer: Medicare Other | Admitting: Gastroenterology

## 2021-11-03 VITALS — BP 122/72 | HR 63 | Temp 97.1°F | Resp 21 | Ht 70.0 in | Wt 204.0 lb

## 2021-11-03 DIAGNOSIS — D123 Benign neoplasm of transverse colon: Secondary | ICD-10-CM

## 2021-11-03 DIAGNOSIS — Z09 Encounter for follow-up examination after completed treatment for conditions other than malignant neoplasm: Secondary | ICD-10-CM | POA: Diagnosis not present

## 2021-11-03 DIAGNOSIS — Z86010 Personal history of colonic polyps: Secondary | ICD-10-CM

## 2021-11-03 MED ORDER — SODIUM CHLORIDE 0.9 % IV SOLN: 500.0000 mL | Freq: Once | INTRAVENOUS | Status: DC

## 2021-11-03 NOTE — Progress Notes (Signed)
History & Physical  Primary Care Physician:  Cassandria Anger, MD Primary Gastroenterologist: Lucio Edward, MD  CHIEF COMPLAINT:  Personal history of colon polyps   HPI: Adrian [name] is a 71 y.o. male with a personal history of adenomatous and sessile serrated colon polyps for surveillance colonoscopy.   Past Medical History:  Diagnosis Date   Allergic rhinitis    Cancer ([place]) 2016   basal cell skin cancer nose   Cataract    GERD (gastroesophageal reflux disease)    Hyperlipidemia    Hypertension    Hypothyroidism 2010   mild   Hypothyroidism    Parkinson disease (HCC)    Trigeminal neuralgia    L.   Vitamin B12 deficiency 2010    Past Surgical History:  Procedure Laterality Date   ARTHRODESIS METATARSALPHALANGEAL JOINT (MTPJ) Right 07/27/2018   Procedure: Right hallux metatarsal phalangeal joint arthrodesis;  Surgeon: Wylene Simmer, MD;  Location: [facility];  Service: Orthopedics;  Laterality: Right;   arthroscopic surgery for chondromalacia Left 1995   CATARACT EXTRACTION W/ INTRAOCULAR LENS  IMPLANT, BILATERAL  2017   COLONOSCOPY  05/26/2016   Dr.Nandika Stetzer   gamma knife surgery for trigeminal neuralgia  2005   inguinal herniorrhahpy  1991   L.   POLYPECTOMY     THUMB ARTHROSCOPY Right 07/07/2021   TONSILLECTOMY  1959   TRIGEMINAL NERVE DECOMPRESSION  2008   Left  Dr. Claybon Jabs    Prior to Admission medications   Medication Sig Start Date End Date Taking? Authorizing Provider  aspirin EC 81 MG tablet Take 81 mg by mouth daily. Swallow whole.   Yes [provider]  carbidopa-levodopa (SINEMET IR) 25-100 MG tablet Take 1 tablet by mouth 4 (four) times daily. Patient taking differently: Take 2 tablets by mouth 4 (four) times daily. 07/14/20  Yes Plotnikov, Evie Lacks, MD  Cetirizine HCl (ZYRTEC ALLERGY) 10 MG CAPS  01/30/19  Yes [provider]  diphenhydrAMINE HCl (BENADRYL ALLERGY PO)  02/25/19  Yes [provider]   entacapone (COMTAN) 200 MG tablet Take 200 mg by mouth 4 (four) times daily. 06/17/21 09/29/23 Yes [provider]  gabapentin (NEURONTIN) 100 MG capsule Take 2 capsules (200 mg total) by mouth at bedtime. 08/24/21  Yes Plotnikov, Evie Lacks, MD  levothyroxine (SYNTHROID) 75 MCG tablet Take 1 tablet (75 mcg total) by mouth daily. 08/24/21  Yes Plotnikov, Evie Lacks, MD  losartan (COZAAR) 100 MG tablet Take 1 tablet (100 mg total) by mouth daily. 08/24/21  Yes Plotnikov, Evie Lacks, MD  Misc Natural Products (TART CHERRY ADVANCED) CAPS Take by mouth daily.   Yes [provider]  Multiple Vitamins-Minerals (CENTRUM SILVER ADULT 50+ PO) Take 1 tablet by mouth at bedtime.   Yes [provider]  Probiotic Product (PROBIOTIC DAILY PO) Take 1 tablet by mouth at bedtime.   Yes [provider]  rosuvastatin (CRESTOR) 10 MG tablet Take 1 tablet by mouth once daily 10/09/21  Yes Plotnikov, Evie Lacks, MD  sertraline (ZOLOFT) 25 MG tablet Take 1 tablet (25 mg total) by mouth daily. 07/14/20  Yes Plotnikov, Evie Lacks, MD  sildenafil (VIAGRA) 100 MG tablet TAKE 1/2 TO 1 (ONE-HALF TO ONE) TABLET BY MOUTH ONCE DAILY AS NEEDED FOR ERECTILE DYSFUNCTION 10/29/21  Yes Plotnikov, Evie Lacks, MD  traZODone (DESYREL) 50 MG tablet Take 1 tablet (50 mg total) by mouth at bedtime. 08/24/21  Yes Plotnikov, Evie Lacks, MD  TURMERIC PO Take by mouth daily.   Yes  [provider]  VALERIAN ROOT PO Take 1,500 mg by mouth at bedtime.   Yes [provider]  Zinc 50 MG TABS Take 1 tablet by mouth every morning.   Yes [provider]  colchicine 0.6 MG tablet Take 1 tablet (0.6 mg total) by mouth daily as needed (gout or psuedogout pain). 08/10/21   Gregor Hams, MD  halobetasol (ULTRAVATE) 0.05 % cream APPLY CREAM TOPICALLY 2 TIMES DAILY AS NEEDED 05/29/20   Plotnikov, Evie Lacks, MD  ofloxacin (OCUFLOX) 0.3 % ophthalmic solution Place 1 drop into the right eye 3 (three) times daily.  11/05/19   [provider]    Current Outpatient Medications  Medication Sig Dispense Refill   aspirin EC 81 MG tablet Take 81 mg by mouth daily. Swallow whole.     carbidopa-levodopa (SINEMET IR) 25-100 MG tablet Take 1 tablet by mouth 4 (four) times daily. (Patient taking differently: Take 2 tablets by mouth 4 (four) times daily.) 360 tablet 3   Cetirizine HCl (ZYRTEC ALLERGY) 10 MG CAPS      diphenhydrAMINE HCl (BENADRYL ALLERGY PO)      entacapone (COMTAN) 200 MG tablet Take 200 mg by mouth 4 (four) times daily.     gabapentin (NEURONTIN) 100 MG capsule Take 2 capsules (200 mg total) by mouth at bedtime. 180 capsule 3   levothyroxine (SYNTHROID) 75 MCG tablet Take 1 tablet (75 mcg total) by mouth daily. 90 tablet 3   losartan (COZAAR) 100 MG tablet Take 1 tablet (100 mg total) by mouth daily. 90 tablet 3   Misc Natural Products (TART CHERRY ADVANCED) CAPS Take by mouth daily.     Multiple Vitamins-Minerals (CENTRUM SILVER ADULT 50+ PO) Take 1 tablet by mouth at bedtime.     Probiotic Product (PROBIOTIC DAILY PO) Take 1 tablet by mouth at bedtime.     rosuvastatin (CRESTOR) 10 MG tablet Take 1 tablet by mouth once daily 90 tablet 2   sertraline (ZOLOFT) 25 MG tablet Take 1 tablet (25 mg total) by mouth daily. 90 tablet 1   sildenafil (VIAGRA) 100 MG tablet TAKE 1/2 TO 1 (ONE-HALF TO ONE) TABLET BY MOUTH ONCE DAILY AS NEEDED FOR ERECTILE DYSFUNCTION 12 tablet 0   traZODone (DESYREL) 50 MG tablet Take 1 tablet (50 mg total) by mouth at bedtime. 90 tablet 3   TURMERIC PO Take by mouth daily.     VALERIAN ROOT PO Take 1,500 mg by mouth at bedtime.     Zinc 50 MG TABS Take 1 tablet by mouth every morning.     colchicine 0.6 MG tablet Take 1 tablet (0.6 mg total) by mouth daily as needed (gout or psuedogout pain). 30 tablet 2   halobetasol (ULTRAVATE) 0.05 % cream APPLY CREAM TOPICALLY 2 TIMES DAILY AS NEEDED 45 g 1   ofloxacin (OCUFLOX) 0.3 % ophthalmic solution Place 1 drop into the  right eye 3 (three) times daily.     Current Facility-Administered Medications  Medication Dose Route Frequency Provider Last Rate Last Admin   0.9 %  sodium chloride infusion  500 mL Intravenous Once Ladene Artist, MD        Allergies as of 11/03/2021 - Review Complete 11/03/2021  Allergen Reaction Noted   Tape Rash 03/21/2012   Elavil [amitriptyline hcl]  05/04/2017   Neomy-bacit-polymyx-pramoxine Rash 11/19/2019   Other Rash 11/19/2019   Triple antibiotic [bacitracin-neomycin-polymyxin] Rash 03/27/2013   Wound dressing adhesive Rash 11/19/2019    Family History  Problem Relation Age  of Onset   Heart disease Mother 31       MI   Diabetes Mother    Prostate cancer Father 35       prostate   Breast cancer Sister 3       breast ca   Stroke Brother    Colon cancer Neg Hx    Esophageal cancer Neg Hx    Rectal cancer Neg Hx    Stomach cancer Neg Hx     Social History   Socioeconomic History   Marital status: Married    Spouse name: Not on file   Number of children: 1   Years of education: Not on file   Highest education level: Bachelor's degree (e.g., BA, AB, BS)  Occupational History   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, molecular  Tobacco Use   Smoking status: Former    Types: Cigarettes    Quit date: 05/31/1992    Years since quitting: 29.4   Smokeless tobacco: Never  Vaping Use   Vaping Use: Never used  Substance and Sexual Activity   Alcohol use: Yes    Alcohol/week: 14.0 standard drinks    Types: 14 Glasses of wine per week   Drug use: No   Sexual activity: Yes  Other Topics Concern   Not on file  Social History Narrative   UCD   Married x2: 12 years 1st; 15 years ('60) 2nd   One daughter, step daugfhter, step son   Laverle Hobby to bike   Social Determinants of Radio broadcast assistant Strain: Not on file  Food Insecurity: Not on file  Transportation Needs: Not on file  Physical Activity: Not on file  Stress: Not on file   Social Connections: Not on file  Intimate Partner Violence: Not on file    Review of Systems:  All systems reviewed an negative except where noted in HPI.  Gen: Denies any fever, chills, sweats, anorexia, fatigue, weakness, malaise, weight loss, and sleep disorder CV: Denies chest pain, angina, palpitations, syncope, orthopnea, PND, peripheral edema, and claudication. Resp: Denies dyspnea at rest, dyspnea with exercise, cough, sputum, wheezing, coughing up blood, and pleurisy. GI: Denies vomiting blood, jaundice, and fecal incontinence.   Denies dysphagia or odynophagia. GU : Denies urinary burning, blood in urine, urinary frequency, urinary hesitancy, nocturnal urination, and urinary incontinence. MS: Denies joint pain, limitation of movement, and swelling, stiffness, low back pain, extremity pain. Denies muscle weakness, cramps, atrophy.  Derm: Denies rash, itching, dry skin, hives, moles, warts, or unhealing ulcers.  Psych: Denies depression, anxiety, memory loss, suicidal ideation, hallucinations, paranoia, and confusion. Heme: Denies bruising, bleeding, and enlarged lymph nodes. Neuro:  Denies any headaches, dizziness, paresthesias. Endo:  Denies any problems with DM, thyroid, adrenal function.   Physical Exam: General:  Alert, well-developed, in NAD Head:  Normocephalic and atraumatic. Eyes:  Sclera clear, no icterus.   Conjunctiva pink. Ears:  Normal auditory acuity. Mouth:  No deformity or lesions.  Neck:  Supple; no masses . Lungs:  Clear throughout to auscultation.   No wheezes, crackles, or rhonchi. No acute distress. Heart:  Regular rate and rhythm; no murmurs. Abdomen:  Soft, nondistended, nontender. No masses, hepatomegaly. No obvious masses.  Normal bowel .    Rectal:  Deferred   Msk:  Symmetrical without gross deformities.. Pulses:  Normal pulses noted. Extremities:  Without edema. Neurologic:  Alert and  oriented x4;  grossly normal neurologically. Skin:  Intact  without significant lesions or rashes. Cervical  Nodes:  No significant cervical adenopathy. Psych:  Alert and cooperative. Normal mood and affect.  Impression / Plan:   Personal history of adenomatous and sessile serrated colon polyps for surveillance colonoscopy.  Pricilla Riffle. Fuller Plan  11/03/2021, 8:17 AM See Shea Evans, [facility], to contact our on call provider

## 2021-11-03 NOTE — Op Note (Signed)
[facility] Patient Name: Adrian [name] Procedure Date: 11/03/2021 8:15 AM MRN: [id number] Endoscopist: Ladene Artist , MD Age: 71 Referring MD:  Date of Birth: [birth year] Gender: Male Account #: [id number] Procedure:                Colonoscopy Indications:              Surveillance: Personal history of adenomatous and                            sessile serrated polyps on last colonoscopy > 5                            years ago Medicines:                Monitored Anesthesia Care Procedure:                Pre-Anesthesia Assessment:                           - Prior to the procedure, a History and Physical                            was performed, and patient medications and                            allergies were reviewed. The patient's tolerance of                            previous anesthesia was also reviewed. The risks                            and benefits of the procedure and the sedation                            options and risks were discussed with the patient.                            All questions were answered, and informed consent                            was obtained. Prior Anticoagulants: The patient has                            taken no previous anticoagulant or antiplatelet                            agents. ASA Grade Assessment: II - A patient with                            mild systemic disease. After reviewing the risks                            and benefits, the patient was deemed in  satisfactory condition to undergo the procedure.                           After obtaining informed consent, the colonoscope                            was passed under direct vision. Throughout the                            procedure, the patient's blood pressure, pulse, and                            oxygen saturations were monitored continuously. The                            CF [id number] #[id number] was introduced through the anus                             and advanced to the the cecum, identified by                            appendiceal orifice and ileocecal valve. The                            ileocecal valve, appendiceal orifice, and rectum                            were photographed. The quality of the bowel                            preparation was good. The colonoscopy was performed                            without difficulty. The patient tolerated the                            procedure well. Scope In: 8:27:28 AM Scope Out: 8:48:30 AM Scope Withdrawal Time: 0 hours 15 minutes 32 seconds  Total Procedure Duration: 0 hours 21 minutes 2 seconds  Findings:                 The perianal and digital rectal examinations were                            normal.                           Two sessile polyps were found in the transverse                            colon. The polyps were 5 to 7 mm in size. These                            polyps were removed with a cold snare. Resection  and retrieval were complete.                           Multiple medium-mouthed diverticula were found in                            the left colon. There was no evidence of                            diverticular bleeding.                           Internal hemorrhoids were found during                            retroflexion. The hemorrhoids were small and Grade                            I (internal hemorrhoids that do not prolapse).                           The exam was otherwise without abnormality on                            direct and retroflexion views. Complications:            No immediate complications. Estimated blood loss:                            None. Estimated Blood Loss:     Estimated blood loss: none. Impression:               - Two 5 to 7 mm polyps in the transverse colon,                            removed with a cold snare. Resected and retrieved.                           - Moderate  diverticulosis in the left colon.                           - Internal hemorrhoids.                           - The examination was otherwise normal on direct                            and retroflexion views. Recommendation:           - Repeat colonoscopy after studies are complete for                            surveillance based on pathology results.                           - Patient has a contact number available for  emergencies. The signs and symptoms of potential                            delayed complications were discussed with the                            patient. Return to normal activities tomorrow.                            Written discharge instructions were provided to the                            patient.                           - High fiber diet.                           - Continue present medications.                           - Await pathology results. Ladene Artist, MD 11/03/2021 8:51:27 AM This report has been signed electronically.

## 2021-11-03 NOTE — Patient Instructions (Signed)
Handouts Provided:  Polyps, Diverticulosis, hemorrhoids and High Fiber Diet  YOU HAD AN ENDOSCOPIC PROCEDURE TODAY AT [facility]:   Refer to the procedure report that was given to you for any specific questions about what was found during the examination.  If the procedure report does not answer your questions, please call your gastroenterologist to clarify.  If you requested that your care partner not be given the details of your procedure findings, then the procedure report has been included in a sealed envelope for you to review at your convenience later.  YOU SHOULD EXPECT: Some feelings of bloating in the abdomen. Passage of more gas than usual.  Walking can help get rid of the air that was put into your GI tract during the procedure and reduce the bloating. If you had a lower endoscopy (such as a colonoscopy or flexible sigmoidoscopy) you may notice spotting of blood in your stool or on the toilet paper. If you underwent a bowel prep for your procedure, you may not have a normal bowel movement for a few days.  Please Note:  You might notice some irritation and congestion in your nose or some drainage.  This is from the oxygen used during your procedure.  There is no need for concern and it should clear up in a day or so.  SYMPTOMS TO REPORT IMMEDIATELY:  Following lower endoscopy (colonoscopy or flexible sigmoidoscopy):  Excessive amounts of blood in the stool  Significant tenderness or worsening of abdominal pains  Swelling of the abdomen that is new, acute  Fever of 100F or higher  For urgent or emergent issues, a gastroenterologist can be reached at any hour by calling [phone]. Do not use MyChart messaging for urgent concerns.    DIET:  We do recommend a small meal at first, but then you may proceed to your regular diet.  Drink plenty of fluids but you should avoid alcoholic beverages for 24 hours.  ACTIVITY:  You should plan to take it easy for the rest of  today and you should NOT DRIVE or use heavy machinery until tomorrow (because of the sedation medicines used during the test).    FOLLOW UP: Our staff will call the number listed on your records 24-72 hours following your procedure to check on you and address any questions or concerns that you may have regarding the information given to you following your procedure. If we do not reach you, we will leave a message.  We will attempt to reach you two times.  During this call, we will ask if you have developed any symptoms of COVID 19. If you develop any symptoms (ie: fever, flu-like symptoms, shortness of breath, cough etc.) before then, please call [phone].  If you test positive for Covid 19 in the 2 weeks post procedure, please call and report this information to [place].    If any biopsies were taken you will be contacted by phone or by letter within the next 1-3 weeks.  Please call us at [phone] if you have not heard about the biopsies in 3 weeks.    SIGNATURES/CONFIDENTIALITY: You and/or your care partner have signed paperwork which will be entered into your electronic medical record.  These signatures attest to the fact that that the information above on your After Visit Summary has been reviewed and is understood.  Full responsibility of the confidentiality of this discharge information lies with you and/or your care-partner.

## 2021-11-03 NOTE — Progress Notes (Signed)
VSS, transported to PACU °

## 2021-11-03 NOTE — Progress Notes (Signed)
Called to room to assist during endoscopic procedure.  Patient ID and intended procedure confirmed with present staff. Received instructions for my participation in the procedure from the performing physician.  

## 2021-11-04 ENCOUNTER — Telehealth: Payer: Self-pay | Admitting: *Deleted

## 2021-11-04 NOTE — Telephone Encounter (Signed)
  Follow up Call-     11/03/2021    7:38 AM  Call back number  Post procedure Call Back phone  # [phone]  Permission to leave phone message Yes   Spoke with wife  Patient questions:  Do you have a fever, pain , or abdominal swelling? No. Pain Score  0 *  Have you tolerated food without any problems? Yes.    Have you been able to return to your normal activities? Yes.    Do you have any questions about your discharge instructions: Diet   No. Medications  No. Follow up visit  No.  Do you have questions or concerns about your Care? No.  Actions: * If pain score is 4 or above: No action needed, pain <4.

## 2021-11-04 NOTE — Telephone Encounter (Signed)
  Follow up Call-     11/03/2021    7:38 AM  Call back number  Post procedure Call Back phone  # [phone]  Permission to leave phone message Yes     Patient questions:  Message left to call us if necessary.

## 2021-11-10 ENCOUNTER — Encounter: Payer: Self-pay | Admitting: Gastroenterology

## 2021-12-14 DIAGNOSIS — F32A Depression, unspecified: Secondary | ICD-10-CM | POA: Diagnosis not present

## 2021-12-14 DIAGNOSIS — G20 Parkinson's disease: Secondary | ICD-10-CM | POA: Diagnosis not present

## 2022-01-15 DIAGNOSIS — I129 Hypertensive chronic kidney disease with stage 1 through stage 4 chronic kidney disease, or unspecified chronic kidney disease: Secondary | ICD-10-CM | POA: Diagnosis not present

## 2022-01-15 DIAGNOSIS — G20 Parkinson's disease: Secondary | ICD-10-CM | POA: Diagnosis not present

## 2022-01-15 DIAGNOSIS — N1831 Chronic kidney disease, stage 3a: Secondary | ICD-10-CM | POA: Diagnosis not present

## 2022-01-15 DIAGNOSIS — I7121 Aneurysm of the ascending aorta, without rupture: Secondary | ICD-10-CM | POA: Diagnosis not present

## 2022-02-22 ENCOUNTER — Other Ambulatory Visit: Payer: Self-pay | Admitting: Internal Medicine

## 2022-02-24 ENCOUNTER — Ambulatory Visit: Payer: Medicare Other | Admitting: Internal Medicine

## 2022-04-15 ENCOUNTER — Other Ambulatory Visit (HOSPITAL_BASED_OUTPATIENT_CLINIC_OR_DEPARTMENT_OTHER): Payer: Self-pay

## 2022-04-15 MED ORDER — COMIRNATY 30 MCG/0.3ML IM SUSY
PREFILLED_SYRINGE | INTRAMUSCULAR | 0 refills | Status: DC
  Filled 2022-04-15: qty 0.3, 1d supply, fill #0

## 2022-05-10 DIAGNOSIS — Z961 Presence of intraocular lens: Secondary | ICD-10-CM | POA: Diagnosis not present

## 2022-05-10 DIAGNOSIS — H43393 Other vitreous opacities, bilateral: Secondary | ICD-10-CM | POA: Diagnosis not present

## 2022-05-24 ENCOUNTER — Encounter: Payer: Self-pay | Admitting: Internal Medicine

## 2022-06-04 ENCOUNTER — Ambulatory Visit (INDEPENDENT_AMBULATORY_CARE_PROVIDER_SITE_OTHER): Payer: Medicare Other | Admitting: Internal Medicine

## 2022-06-04 ENCOUNTER — Encounter: Payer: Self-pay | Admitting: Internal Medicine

## 2022-06-04 VITALS — BP 120/82 | HR 48 | Temp 97.6°F | Ht 70.0 in | Wt 193.0 lb

## 2022-06-04 DIAGNOSIS — R634 Abnormal weight loss: Secondary | ICD-10-CM

## 2022-06-04 DIAGNOSIS — N320 Bladder-neck obstruction: Secondary | ICD-10-CM | POA: Diagnosis not present

## 2022-06-04 DIAGNOSIS — N183 Chronic kidney disease, stage 3 unspecified: Secondary | ICD-10-CM

## 2022-06-04 DIAGNOSIS — K4090 Unilateral inguinal hernia, without obstruction or gangrene, not specified as recurrent: Secondary | ICD-10-CM | POA: Diagnosis not present

## 2022-06-04 DIAGNOSIS — E034 Atrophy of thyroid (acquired): Secondary | ICD-10-CM | POA: Diagnosis not present

## 2022-06-04 DIAGNOSIS — E538 Deficiency of other specified B group vitamins: Secondary | ICD-10-CM | POA: Diagnosis not present

## 2022-06-04 LAB — CBC WITH DIFFERENTIAL/PLATELET
Basophils Absolute: 0 10*3/uL (ref 0.0–0.1)
Basophils Relative: 0.4 % (ref 0.0–3.0)
Eosinophils Absolute: 0.3 10*3/uL (ref 0.0–0.7)
Eosinophils Relative: 5.8 % — ABNORMAL HIGH (ref 0.0–5.0)
HCT: 44.2 % (ref 39.0–52.0)
Hemoglobin: 15.1 g/dL (ref 13.0–17.0)
Lymphocytes Relative: 28.2 % (ref 12.0–46.0)
Lymphs Abs: 1.5 10*3/uL (ref 0.7–4.0)
MCHC: 34.1 g/dL (ref 30.0–36.0)
MCV: 92 fl (ref 78.0–100.0)
Monocytes Absolute: 0.5 10*3/uL (ref 0.1–1.0)
Monocytes Relative: 9.5 % (ref 3.0–12.0)
Neutro Abs: 3 10*3/uL (ref 1.4–7.7)
Neutrophils Relative %: 56.1 % (ref 43.0–77.0)
Platelets: 153 10*3/uL (ref 150.0–400.0)
RBC: 4.81 Mil/uL (ref 4.22–5.81)
RDW: 13.3 % (ref 11.5–15.5)
WBC: 5.4 10*3/uL (ref 4.0–10.5)

## 2022-06-04 LAB — URINALYSIS
Bilirubin Urine: NEGATIVE
Hgb urine dipstick: NEGATIVE
Leukocytes,Ua: NEGATIVE
Nitrite: NEGATIVE
Specific Gravity, Urine: 1.02 (ref 1.000–1.030)
Total Protein, Urine: NEGATIVE
Urine Glucose: NEGATIVE
Urobilinogen, UA: 0.2 (ref 0.0–1.0)
pH: 6 (ref 5.0–8.0)

## 2022-06-04 LAB — COMPREHENSIVE METABOLIC PANEL
ALT: 6 U/L (ref 0–53)
AST: 16 U/L (ref 0–37)
Albumin: 4.5 g/dL (ref 3.5–5.2)
Alkaline Phosphatase: 48 U/L (ref 39–117)
BUN: 21 mg/dL (ref 6–23)
CO2: 28 mEq/L (ref 19–32)
Calcium: 9.7 mg/dL (ref 8.4–10.5)
Chloride: 102 mEq/L (ref 96–112)
Creatinine, Ser: 1.37 mg/dL (ref 0.40–1.50)
GFR: 51.94 mL/min — ABNORMAL LOW (ref >60.00)
Glucose, Bld: 97 mg/dL (ref 70–99)
Potassium: 4.1 mEq/L (ref 3.5–5.1)
Sodium: 139 mEq/L (ref 135–145)
Total Bilirubin: 0.6 mg/dL (ref 0.2–1.2)
Total Protein: 6.6 g/dL (ref 6.0–8.3)

## 2022-06-04 LAB — TSH: TSH: 3.14 u[IU]/mL (ref 0.35–5.50)

## 2022-06-04 LAB — LIPID PANEL
Cholesterol: 105 mg/dL (ref 0–200)
HDL: 53 mg/dL (ref >39.00)
LDL Cholesterol: 34 mg/dL (ref 0–99)
NonHDL: 51.73
Total CHOL/HDL Ratio: 2
Triglycerides: 91 mg/dL (ref 0.0–149.0)
VLDL: 18.2 mg/dL (ref 0.0–40.0)

## 2022-06-04 LAB — PSA: PSA: 1.05 ng/mL (ref 0.10–4.00)

## 2022-06-04 LAB — VITAMIN B12: Vitamin B-12: 477 pg/mL (ref 211–911)

## 2022-06-04 LAB — T4, FREE: Free T4: 0.8 ng/dL (ref 0.60–1.60)

## 2022-06-04 MED ORDER — SERTRALINE HCL 25 MG PO TABS: 25.0000 mg | ORAL_TABLET | Freq: Every day | ORAL | 3 refills | Status: DC

## 2022-06-04 MED ORDER — TRULANCE 3 MG PO TABS: 3.0000 mg | ORAL_TABLET | Freq: Every day | ORAL | 11 refills | Status: DC

## 2022-06-04 MED ORDER — LEVOTHYROXINE SODIUM 75 MCG PO TABS: 75.0000 ug | ORAL_TABLET | Freq: Every day | ORAL | 3 refills | Status: DC

## 2022-06-04 MED ORDER — SILDENAFIL CITRATE 100 MG PO TABS: ORAL_TABLET | ORAL | 5 refills | Status: DC

## 2022-06-04 MED ORDER — LOSARTAN POTASSIUM 100 MG PO TABS: 100.0000 mg | ORAL_TABLET | Freq: Every day | ORAL | 3 refills | Status: DC

## 2022-06-04 NOTE — Assessment & Plan Note (Signed)
On Levothyroxine Check TSH and FT4

## 2022-06-04 NOTE — Assessment & Plan Note (Signed)
R side - worse Will ref to [facility]

## 2022-06-04 NOTE — Assessment & Plan Note (Signed)
Unclear etiology Check TSH and other labs

## 2022-06-04 NOTE — Progress Notes (Signed)
Subjective:  Patient ID: Adrian [name], male    DOB: 09-10-1950  Age: 72 y.o. MRN: [id number]  CC: Hernia (On right side hurts sometimes )   HPI Adrian [name] presents for wt loss, Parkinson's. C/o hernia on the R - new  Outpatient Medications Prior to Visit  Medication Sig Dispense Refill   Amantadine HCl 100 MG tablet Take 100 mg by mouth 2 (two) times daily.     AREXVY 120 MCG/0.5ML injection      aspirin EC 81 MG tablet Take 81 mg by mouth daily. Swallow whole.     carbidopa-levodopa (SINEMET IR) 25-100 MG tablet Take 1 tablet by mouth 4 (four) times daily. (Patient taking differently: Take 2 tablets by mouth 4 (four) times daily.) 360 tablet 3   Cetirizine HCl (ZYRTEC ALLERGY) 10 MG CAPS      colchicine 0.6 MG tablet Take 1 tablet (0.6 mg total) by mouth daily as needed (gout or psuedogout pain). 30 tablet 2   COVID-19 mRNA vaccine 2023-2024 (COMIRNATY) syringe Inject into the muscle. 0.3 mL 0   diphenhydrAMINE HCl (BENADRYL ALLERGY PO)      entacapone (COMTAN) 200 MG tablet Take 200 mg by mouth 4 (four) times daily.     FLUZONE HIGH-DOSE QUADRIVALENT 0.7 ML SUSY      gabapentin (NEURONTIN) 100 MG capsule Take 2 capsules (200 mg total) by mouth at bedtime. 180 capsule 3   halobetasol (ULTRAVATE) 0.05 % cream APPLY CREAM TOPICALLY 2 TIMES DAILY AS NEEDED 45 g 1   LUTEIN PO Take 1 tablet by mouth daily.     methocarbamol (ROBAXIN) 500 MG tablet Take by mouth.     Misc Natural Products (TART CHERRY ADVANCED) CAPS Take by mouth daily.     Multiple Vitamins-Minerals (CENTRUM SILVER ADULT 50+ PO) Take 1 tablet by mouth at bedtime.     ofloxacin (OCUFLOX) 0.3 % ophthalmic solution Place 1 drop into the right eye 3 (three) times daily.     Probiotic Product (PROBIOTIC DAILY PO) Take 1 tablet by mouth at bedtime.     rosuvastatin (CRESTOR) 10 MG tablet Take 1 tablet by mouth once daily 90 tablet 2   traZODone (DESYREL) 50 MG tablet Take 1 tablet (50 mg total) by mouth at bedtime. 90  tablet 3   triamcinolone cream (KENALOG) 0.1 %      TURMERIC PO Take by mouth daily.     VALERIAN ROOT PO Take 1,500 mg by mouth at bedtime.     Zinc 50 MG TABS Take 1 tablet by mouth every morning.     zinc gluconate 3.75 mg/mL SOLN Take by mouth.     levothyroxine (SYNTHROID) 75 MCG tablet Take 1 tablet (75 mcg total) by mouth daily. 90 tablet 3   losartan (COZAAR) 100 MG tablet Take 1 tablet (100 mg total) by mouth daily. 90 tablet 3   sertraline (ZOLOFT) 25 MG tablet Take 1 tablet (25 mg total) by mouth daily. 90 tablet 1   sildenafil (VIAGRA) 100 MG tablet TAKE 1/2 TO 1 (ONE-HALF TO ONE) TABLET BY MOUTH ONCE DAILY AS NEEDED FOR ERECTILE DYSFUNCTION 12 tablet 0   No facility-administered medications prior to visit.    ROS: Review of Systems  Constitutional:  Positive for unexpected weight change. Negative for appetite change and fatigue.  HENT:  Negative for congestion, nosebleeds, sneezing, sore throat and trouble swallowing.   Eyes:  Negative for itching and visual disturbance.  Respiratory:  Negative for cough.   Cardiovascular:  Negative for chest pain, palpitations and leg swelling.  Gastrointestinal:  Negative for abdominal distention, blood in stool, diarrhea and nausea.  Genitourinary:  Negative for frequency and hematuria.  Musculoskeletal:  Negative for back pain, gait problem, joint swelling and neck pain.  Skin:  Negative for rash.  Neurological:  Negative for dizziness, tremors, speech difficulty and weakness.  Psychiatric/Behavioral:  Negative for agitation, dysphoric mood and sleep disturbance. The patient is not nervous/anxious.     Objective:  BP 120/82 (BP Location: Left Arm, Patient Position: Sitting, Cuff Size: Normal)   Pulse (!) 48   Temp 97.6 F (36.4 C) (Oral)   Ht '5\' 10"'$  (1.778 m)   Wt 193 lb (87.5 kg)   SpO2 99%   BMI 27.69 kg/m   BP Readings from Last 3 Encounters:  06/04/22 120/82  11/03/21 122/72  10/28/21 107/78    Wt Readings from Last  3 Encounters:  06/04/22 193 lb (87.5 kg)  11/03/21 204 lb (92.5 kg)  10/28/21 200 lb (90.7 kg)    Physical Exam Constitutional:      General: He is not in acute distress.    Appearance: Normal appearance. He is well-developed.     Comments: NAD  Eyes:     Conjunctiva/sclera: Conjunctivae normal.     Pupils: Pupils are equal, round, and reactive to light.  Neck:     Thyroid: No thyromegaly.     Vascular: No JVD.  Cardiovascular:     Rate and Rhythm: Normal rate and regular rhythm.     Heart sounds: Normal heart sounds. No murmur heard.    No friction rub. No gallop.  Pulmonary:     Effort: Pulmonary effort is normal. No respiratory distress.     Breath sounds: Normal breath sounds. No wheezing or rales.  Chest:     Chest wall: No tenderness.  Abdominal:     General: Bowel sounds are normal. There is no distension.     Palpations: Abdomen is soft. There is no mass.     Tenderness: There is no abdominal tenderness. There is no guarding or rebound.     Hernia: A hernia is present.  Musculoskeletal:        General: No tenderness. Normal range of motion.     Cervical back: Normal range of motion.  Lymphadenopathy:     Cervical: No cervical adenopathy.  Skin:    General: Skin is warm and dry.     Findings: No rash.  Neurological:     Mental Status: He is alert and oriented to person, place, and time.     Cranial Nerves: No cranial nerve deficit.     Motor: No abnormal muscle tone.     Coordination: Coordination normal.     Gait: Gait normal.     Deep Tendon Reflexes: Reflexes are normal and symmetric.  Psychiatric:        Behavior: Behavior normal.        Thought Content: Thought content normal.        Judgment: Judgment normal.    Large R inguinal hernia, tremor    A total time of 45 minutes was spent preparing to see the patient, reviewing tests, x-rays, operative reports and other medical records.  Also, obtaining history and performing comprehensive physical exam.   Additionally, counseling the patient regarding the above listed issues - tremor, wt loss, hernia.   Finally, documenting clinical information in the health records, coordination of care, educating the patient.     Lab Results  Component Value Date   WBC 4.9 10/28/2021   HGB 14.6 10/28/2021   HCT 44.1 10/28/2021   PLT 166 10/28/2021   GLUCOSE 102 (H) 10/28/2021   CHOL 117 08/24/2021   TRIG 133.0 08/24/2021   HDL 55.50 08/24/2021   LDLDIRECT 66.0 05/10/2016   LDLCALC 35 08/24/2021   ALT 6 08/24/2021   AST 16 08/24/2021   NA 141 10/28/2021   K 4.0 10/28/2021   CL 107 10/28/2021   CREATININE 1.36 (H) 10/28/2021   BUN 14 10/28/2021   CO2 25 10/28/2021   TSH 4.07 08/24/2021   PSA 1.64 08/24/2021   INR 1.0 RATIO 09/18/2007   HGBA1C 5.6 06/20/2019    No results found.  Assessment & Plan:   Problem List Items Addressed This Visit       Endocrine   Hypothyroidism    On Levothyroxine Check TSH and FT4      Relevant Medications   levothyroxine (SYNTHROID) 75 MCG tablet   Other Relevant Orders   TSH   Urinalysis   CBC with Differential/Platelet   Lipid panel   Comprehensive metabolic panel   PSA   T4, free     Genitourinary   CRF (chronic renal failure), stage 3 (moderate)    Monitor GFR      Relevant Orders   TSH   Urinalysis   CBC with Differential/Platelet   Lipid panel   Comprehensive metabolic panel   PSA   T4, free     Other   Weight loss    Unclear etiology Check TSH and other labs      Inguinal hernia - Primary    R side - worse Will ref to [facility]      Relevant Orders   Ambulatory referral to General Surgery   B12 deficiency   Relevant Orders   Vitamin B12   Other Visit Diagnoses     Bladder neck obstruction       Relevant Orders   PSA         Meds ordered this encounter  Medications   Plecanatide (TRULANCE) 3 MG TABS    Sig: Take 3 mg by mouth daily. For constipation    Dispense:  30 tablet    Refill:  11   levothyroxine  (SYNTHROID) 75 MCG tablet    Sig: Take 1 tablet (75 mcg total) by mouth daily.    Dispense:  90 tablet    Refill:  3   losartan (COZAAR) 100 MG tablet    Sig: Take 1 tablet (100 mg total) by mouth daily.    Dispense:  90 tablet    Refill:  3   sertraline (ZOLOFT) 25 MG tablet    Sig: Take 1 tablet (25 mg total) by mouth daily.    Dispense:  90 tablet    Refill:  3   sildenafil (VIAGRA) 100 MG tablet    Sig: TAKE 1/2 TO 1 (ONE-HALF TO ONE) TABLET BY MOUTH ONCE DAILY AS NEEDED FOR ERECTILE DYSFUNCTION    Dispense:  12 tablet    Refill:  5      Follow-up: Return in about 6 months (around 12/03/2022) for a follow-up visit.  Walker Kehr, MD

## 2022-06-04 NOTE — Assessment & Plan Note (Signed)
Monitor GFR 

## 2022-06-04 NOTE — Patient Instructions (Signed)
Wt Readings from Last 3 Encounters:  06/04/22 193 lb (87.5 kg)  11/03/21 204 lb (92.5 kg)  10/28/21 200 lb (90.7 kg)

## 2022-06-23 DIAGNOSIS — K4090 Unilateral inguinal hernia, without obstruction or gangrene, not specified as recurrent: Secondary | ICD-10-CM | POA: Diagnosis not present

## 2022-06-23 DIAGNOSIS — G20A1 Parkinson's disease without dyskinesia, without mention of fluctuations: Secondary | ICD-10-CM | POA: Diagnosis not present

## 2022-06-23 DIAGNOSIS — N189 Chronic kidney disease, unspecified: Secondary | ICD-10-CM | POA: Diagnosis not present

## 2022-06-23 DIAGNOSIS — I7121 Aneurysm of the ascending aorta, without rupture: Secondary | ICD-10-CM | POA: Diagnosis not present

## 2022-06-24 ENCOUNTER — Ambulatory Visit: Payer: Self-pay | Admitting: General Surgery

## 2022-06-24 DIAGNOSIS — R7989 Other specified abnormal findings of blood chemistry: Secondary | ICD-10-CM

## 2022-06-24 NOTE — Progress Notes (Signed)
Surgery orders requested via Epic inbox. °

## 2022-06-28 NOTE — Patient Instructions (Signed)
SURGICAL WAITING ROOM VISITATION  Patients having surgery or a procedure may have no more than 2 support people in the waiting area - these visitors may rotate.    Children under the age of 12 must have an adult with them who is not the patient.  Due to an increase in RSV and influenza rates and associated hospitalizations, children ages 38 and under may not visit patients in [place].  If the patient needs to stay at the hospital during part of their recovery, the visitor guidelines for inpatient rooms apply. Pre-op nurse will coordinate an appropriate time for 1 support person to accompany patient in pre-op.  This support person may not rotate.    Please refer to the [facility] website for the visitor guidelines for Inpatients (after your surgery is over and you are in a regular room).       Your procedure is scheduled on:  07/08/2022    Report to [facility] Main Entrance    Report to admitting at  0800 AM   Call this number if you have problems the morning of surgery [phone]   Do not eat food :After Midnight.   After Midnight you may have the following liquids until __ 0700____ AM  DAY OF SURGERY  Water Non-Citrus Juices (without pulp, NO RED-Apple, White grape, White cranberry) Black Coffee (NO MILK/CREAM OR CREAMERS, sugar ok)  Clear Tea (NO MILK/CREAM OR CREAMERS, sugar ok) regular and decaf                             Plain Jell-O (NO RED)                                           Fruit ices (not with fruit pulp, NO RED)                                     Popsicles (NO RED)                                                               Sports drinks like Gatorade (NO RED)                         If you have questions, please contact your surgeon's office.        Oral Hygiene is also important to reduce your risk of infection.                                    Remember - BRUSH YOUR TEETH THE MORNING OF SURGERY WITH YOUR REGULAR  TOOTHPASTE  DENTURES WILL BE REMOVED PRIOR TO SURGERY PLEASE DO NOT APPLY "Poly grip" OR ADHESIVES!!!   Do NOT smoke after Midnight   Take these medicines the morning of surgery with A SIP OF WATER:  sinemet, zyrtec, comtan, synthroid, zoloft   DO NOT TAKE ANY ORAL DIABETIC MEDICATIONS DAY OF YOUR SURGERY  Bring CPAP mask  and tubing day of surgery.                              You may not have any metal on your body including hair pins, jewelry, and body piercing             Do not wear make-up, lotions, powders, perfumes/cologne, or deodorant  Do not wear nail polish including gel and S&S, artificial/acrylic nails, or any other type of covering on natural nails including finger and toenails. If you have artificial nails, gel coating, etc. that needs to be removed by a nail salon please have this removed prior to surgery or surgery may need to be canceled/ delayed if the surgeon/ anesthesia feels like they are unable to be safely monitored.   Do not shave  48 hours prior to surgery.               Men may shave face and neck.   Do not bring valuables to the hospital. [place].   Contacts, glasses, dentures or bridgework may not be worn into surgery.   Bring small overnight bag day of surgery.   DO NOT [place]. PHARMACY WILL DISPENSE MEDICATIONS LISTED ON YOUR MEDICATION LIST TO YOU DURING YOUR ADMISSION [place]!    Patients discharged on the day of surgery will not be allowed to drive home.  Someone NEEDS to stay with you for the first 24 hours after anesthesia.   Special Instructions: Bring a copy of your healthcare power of attorney and living will documents the day of surgery if you haven't scanned them before.              Please read over the following fact sheets you were given: IF [place] [phone]   If you received a COVID  test during your pre-op visit  it is requested that you wear a mask when out in public, stay away from anyone that may not be feeling well and notify your surgeon if you develop symptoms. If you test positive for Covid or have been in contact with anyone that has tested positive in the last 10 days please notify you surgeon.    [facility] - Preparing for Surgery Before surgery, you can play an important role.  Because skin is not sterile, your skin needs to be as free of germs as possible.  You can reduce the number of germs on your skin by washing with CHG (chlorahexidine gluconate) soap before surgery.  CHG is an antiseptic cleaner which kills germs and bonds with the skin to continue killing germs even after washing. Please DO NOT use if you have an allergy to CHG or antibacterial soaps.  If your skin becomes reddened/irritated stop using the CHG and inform your nurse when you arrive at Short Stay. Do not shave (including legs and underarms) for at least 48 hours prior to the first CHG shower.  You may shave your face/neck. Please follow these instructions carefully:  1.  Shower with CHG Soap the night before surgery and the  morning of Surgery.  2.  If you choose to wash your hair, wash your hair first as usual with your  normal  shampoo.  3.  After you shampoo, rinse your hair  and body thoroughly to remove the  shampoo.                           4.  Use CHG as you would any other liquid soap.  You can apply chg directly  to the skin and wash                       Gently with a scrungie or clean washcloth.  5.  Apply the CHG Soap to your body ONLY FROM THE NECK DOWN.   Do not use on face/ open                           Wound or open sores. Avoid contact with eyes, ears mouth and genitals (private parts).                       Wash face,  Genitals (private parts) with your normal soap.             6.  Wash thoroughly, paying special attention to the area where your surgery  will be performed.  7.   Thoroughly rinse your body with warm water from the neck down.  8.  DO NOT shower/wash with your normal soap after using and rinsing off  the CHG Soap.                9.  Pat yourself dry with a clean towel.            10.  Wear clean pajamas.            11.  Place clean sheets on your bed the night of your first shower and do not  sleep with pets. Day of Surgery : Do not apply any lotions/deodorants the morning of surgery.  Please wear clean clothes to the hospital/surgery center.  FAILURE TO FOLLOW THESE INSTRUCTIONS MAY RESULT IN THE CANCELLATION OF YOUR SURGERY PATIENT SIGNATURE_________________________________  NURSE SIGNATURE__________________________________  ________________________________________________________________________

## 2022-06-28 NOTE — Progress Notes (Signed)
Anesthesia Review:  PCP: Plotnikov LOV 06/04/22  Cardiologist : Chest x-ray : EKG : Echo : CT Cardiac- 2021  Stress test: 06/18/2020  Cardiac Cath :  Activity level:  Sleep Study/ CPAP : Fasting Blood Sugar :      / Checks Blood Sugar -- times a day:   Blood Thinner/ Instructions /Last Dose: ASA / Instructions/ Last Dose :

## 2022-06-30 ENCOUNTER — Encounter (HOSPITAL_COMMUNITY): Payer: Self-pay

## 2022-06-30 ENCOUNTER — Encounter (HOSPITAL_COMMUNITY)
Admission: RE | Admit: 2022-06-30 | Discharge: 2022-06-30 | Disposition: A | Discharge status: Home / Self Care | Payer: Medicare Other | Source: Ambulatory Visit | Attending: General Surgery | Admitting: General Surgery

## 2022-06-30 ENCOUNTER — Other Ambulatory Visit: Payer: Self-pay

## 2022-06-30 VITALS — BP 137/85 | HR 52 | Temp 98.1°F | Resp 16 | Ht 69.0 in | Wt 198.0 lb

## 2022-06-30 DIAGNOSIS — Z01818 Encounter for other preprocedural examination: Secondary | ICD-10-CM | POA: Diagnosis not present

## 2022-06-30 DIAGNOSIS — R7989 Other specified abnormal findings of blood chemistry: Secondary | ICD-10-CM | POA: Diagnosis not present

## 2022-06-30 HISTORY — DX: Unspecified osteoarthritis, unspecified site: M19.90

## 2022-06-30 HISTORY — DX: Chronic kidney disease, unspecified: N18.9

## 2022-06-30 HISTORY — DX: Depression, unspecified: F32.A

## 2022-06-30 LAB — BASIC METABOLIC PANEL
Anion gap: 9 (ref 5–15)
BUN: 21 mg/dL (ref 8–23)
CO2: 25 mmol/L (ref 22–32)
Calcium: 8.9 mg/dL (ref 8.9–10.3)
Chloride: 104 mmol/L (ref 98–111)
Creatinine, Ser: 1.44 mg/dL — ABNORMAL HIGH (ref 0.61–1.24)
GFR, Estimated: 52 mL/min — ABNORMAL LOW (ref >60)
Glucose, Bld: 106 mg/dL — ABNORMAL HIGH (ref 70–99)
Potassium: 3.9 mmol/L (ref 3.5–5.1)
Sodium: 138 mmol/L (ref 135–145)

## 2022-06-30 LAB — CBC WITH DIFFERENTIAL/PLATELET
Abs Immature Granulocytes: 0.03 10*3/uL (ref 0.00–0.07)
Basophils Absolute: 0 10*3/uL (ref 0.0–0.1)
Basophils Relative: 0 %
Eosinophils Absolute: 0.2 10*3/uL (ref 0.0–0.5)
Eosinophils Relative: 5 %
HCT: 43.7 % (ref 39.0–52.0)
Hemoglobin: 14.8 g/dL (ref 13.0–17.0)
Immature Granulocytes: 1 %
Lymphocytes Relative: 29 %
Lymphs Abs: 1.4 10*3/uL (ref 0.7–4.0)
MCH: 31.6 pg (ref 26.0–34.0)
MCHC: 33.9 g/dL (ref 30.0–36.0)
MCV: 93.4 fL (ref 80.0–100.0)
Monocytes Absolute: 0.4 10*3/uL (ref 0.1–1.0)
Monocytes Relative: 9 %
Neutro Abs: 2.6 10*3/uL (ref 1.7–7.7)
Neutrophils Relative %: 56 %
Platelets: 144 10*3/uL — ABNORMAL LOW (ref 150–400)
RBC: 4.68 MIL/uL (ref 4.22–5.81)
RDW: 12.9 % (ref 11.5–15.5)
WBC: 4.7 10*3/uL (ref 4.0–10.5)
nRBC: 0 % (ref 0.0–0.2)

## 2022-07-07 ENCOUNTER — Other Ambulatory Visit: Payer: Self-pay | Admitting: Internal Medicine

## 2022-07-08 ENCOUNTER — Ambulatory Visit (HOSPITAL_BASED_OUTPATIENT_CLINIC_OR_DEPARTMENT_OTHER): Payer: Medicare Other | Admitting: Anesthesiology

## 2022-07-08 ENCOUNTER — Ambulatory Visit (HOSPITAL_COMMUNITY): Payer: Medicare Other | Admitting: Physician Assistant

## 2022-07-08 ENCOUNTER — Encounter (HOSPITAL_COMMUNITY): Payer: Self-pay | Admitting: General Surgery

## 2022-07-08 ENCOUNTER — Other Ambulatory Visit: Payer: Self-pay

## 2022-07-08 ENCOUNTER — Encounter (HOSPITAL_COMMUNITY)
Admission: RE | Disposition: A | Discharge status: Home / Self Care | Payer: Self-pay | Source: Ambulatory Visit | Attending: General Surgery

## 2022-07-08 ENCOUNTER — Ambulatory Visit (HOSPITAL_COMMUNITY)
Admission: RE | Admit: 2022-07-08 | Discharge: 2022-07-08 | Disposition: A | Discharge status: Home / Self Care | Payer: Medicare Other | Source: Ambulatory Visit | Attending: General Surgery | Admitting: General Surgery

## 2022-07-08 DIAGNOSIS — F32A Depression, unspecified: Secondary | ICD-10-CM | POA: Insufficient documentation

## 2022-07-08 DIAGNOSIS — D176 Benign lipomatous neoplasm of spermatic cord: Secondary | ICD-10-CM | POA: Insufficient documentation

## 2022-07-08 DIAGNOSIS — G709 Myoneural disorder, unspecified: Secondary | ICD-10-CM | POA: Insufficient documentation

## 2022-07-08 DIAGNOSIS — M1990 Unspecified osteoarthritis, unspecified site: Secondary | ICD-10-CM | POA: Diagnosis not present

## 2022-07-08 DIAGNOSIS — I10 Essential (primary) hypertension: Secondary | ICD-10-CM

## 2022-07-08 DIAGNOSIS — K4090 Unilateral inguinal hernia, without obstruction or gangrene, not specified as recurrent: Secondary | ICD-10-CM | POA: Diagnosis not present

## 2022-07-08 DIAGNOSIS — I2510 Atherosclerotic heart disease of native coronary artery without angina pectoris: Secondary | ICD-10-CM | POA: Insufficient documentation

## 2022-07-08 DIAGNOSIS — E039 Hypothyroidism, unspecified: Secondary | ICD-10-CM | POA: Diagnosis not present

## 2022-07-08 DIAGNOSIS — Z87891 Personal history of nicotine dependence: Secondary | ICD-10-CM

## 2022-07-08 DIAGNOSIS — N189 Chronic kidney disease, unspecified: Secondary | ICD-10-CM | POA: Diagnosis not present

## 2022-07-08 DIAGNOSIS — G20A1 Parkinson's disease without dyskinesia, without mention of fluctuations: Secondary | ICD-10-CM | POA: Insufficient documentation

## 2022-07-08 DIAGNOSIS — I129 Hypertensive chronic kidney disease with stage 1 through stage 4 chronic kidney disease, or unspecified chronic kidney disease: Secondary | ICD-10-CM | POA: Insufficient documentation

## 2022-07-08 DIAGNOSIS — Z01818 Encounter for other preprocedural examination: Secondary | ICD-10-CM

## 2022-07-08 SURGERY — HERNIORRHAPHY, INGUINAL, ROBOT-ASSISTED, LAPAROSCOPIC
Anesthesia: General | Laterality: Right

## 2022-07-08 MED ORDER — CHLORHEXIDINE GLUCONATE CLOTH 2 % EX PADS: 6.0000 | MEDICATED_PAD | Freq: Once | CUTANEOUS | Status: DC

## 2022-07-08 MED ORDER — ONDANSETRON HCL 4 MG/2ML IJ SOLN
INTRAMUSCULAR | Status: AC
  Filled 2022-07-08: qty 2

## 2022-07-08 MED ORDER — FENTANYL CITRATE (PF) 100 MCG/2ML IJ SOLN
INTRAMUSCULAR | Status: AC
  Filled 2022-07-08: qty 2

## 2022-07-08 MED ORDER — ACETAMINOPHEN 500 MG PO TABS
1000.0000 mg | ORAL_TABLET | ORAL | Status: AC
  Administered 2022-07-08: 1000 mg via ORAL
  Filled 2022-07-08: qty 2

## 2022-07-08 MED ORDER — ROCURONIUM BROMIDE 10 MG/ML (PF) SYRINGE
PREFILLED_SYRINGE | INTRAVENOUS | Status: AC
  Filled 2022-07-08: qty 10

## 2022-07-08 MED ORDER — ONDANSETRON HCL 4 MG/2ML IJ SOLN
INTRAMUSCULAR | Status: DC | PRN
  Administered 2022-07-08: 4 mg via INTRAVENOUS

## 2022-07-08 MED ORDER — LIDOCAINE 2% (20 MG/ML) 5 ML SYRINGE
INTRAMUSCULAR | Status: DC | PRN
  Administered 2022-07-08: 60 mg via INTRAVENOUS

## 2022-07-08 MED ORDER — ACETAMINOPHEN 500 MG PO TABS: 1000.0000 mg | ORAL_TABLET | Freq: Once | ORAL | Status: DC | PRN

## 2022-07-08 MED ORDER — ACETAMINOPHEN 160 MG/5ML PO SOLN: 1000.0000 mg | Freq: Once | ORAL | Status: DC | PRN

## 2022-07-08 MED ORDER — OXYCODONE HCL 5 MG PO TABS
ORAL_TABLET | ORAL | Status: AC
  Filled 2022-07-08: qty 1

## 2022-07-08 MED ORDER — FENTANYL CITRATE PF 50 MCG/ML IJ SOSY
PREFILLED_SYRINGE | INTRAMUSCULAR | Status: AC
  Filled 2022-07-08: qty 1

## 2022-07-08 MED ORDER — ORAL CARE MOUTH RINSE: 15.0000 mL | Freq: Once | OROMUCOSAL | Status: AC

## 2022-07-08 MED ORDER — TRAMADOL HCL 50 MG PO TABS: 50.0000 mg | ORAL_TABLET | Freq: Four times a day (QID) | ORAL | 0 refills | Status: AC | PRN

## 2022-07-08 MED ORDER — GLYCOPYRROLATE 0.2 MG/ML IJ SOLN
INTRAMUSCULAR | Status: DC | PRN
  Administered 2022-07-08: .2 mg via INTRAVENOUS

## 2022-07-08 MED ORDER — EPHEDRINE 5 MG/ML INJ
INTRAVENOUS | Status: AC
  Filled 2022-07-08: qty 5

## 2022-07-08 MED ORDER — ACETAMINOPHEN 10 MG/ML IV SOLN
INTRAVENOUS | Status: AC
  Filled 2022-07-08: qty 100

## 2022-07-08 MED ORDER — DEXAMETHASONE SODIUM PHOSPHATE 10 MG/ML IJ SOLN
INTRAMUSCULAR | Status: AC
  Filled 2022-07-08: qty 1

## 2022-07-08 MED ORDER — ACETAMINOPHEN 500 MG PO TABS: 1000.0000 mg | ORAL_TABLET | Freq: Three times a day (TID) | ORAL | 0 refills | Status: AC

## 2022-07-08 MED ORDER — 0.9 % SODIUM CHLORIDE (POUR BTL) OPTIME
TOPICAL | Status: DC | PRN
  Administered 2022-07-08: 1000 mL

## 2022-07-08 MED ORDER — GLYCOPYRROLATE 0.2 MG/ML IJ SOLN
INTRAMUSCULAR | Status: AC
  Filled 2022-07-08: qty 1

## 2022-07-08 MED ORDER — BUPIVACAINE HCL (PF) 0.25 % IJ SOLN
INTRAMUSCULAR | Status: AC
  Filled 2022-07-08: qty 30

## 2022-07-08 MED ORDER — BUPIVACAINE LIPOSOME 1.3 % IJ SUSP
INTRAMUSCULAR | Status: AC
  Filled 2022-07-08: qty 20

## 2022-07-08 MED ORDER — PROPOFOL 10 MG/ML IV BOLUS
INTRAVENOUS | Status: DC | PRN
  Administered 2022-07-08: 160 mg via INTRAVENOUS

## 2022-07-08 MED ORDER — ROCURONIUM BROMIDE 10 MG/ML (PF) SYRINGE
PREFILLED_SYRINGE | INTRAVENOUS | Status: DC | PRN
  Administered 2022-07-08: 30 mg via INTRAVENOUS
  Administered 2022-07-08: 70 mg via INTRAVENOUS
  Administered 2022-07-08: 10 mg via INTRAVENOUS

## 2022-07-08 MED ORDER — FENTANYL CITRATE PF 50 MCG/ML IJ SOSY
25.0000 ug | PREFILLED_SYRINGE | INTRAMUSCULAR | Status: DC | PRN
  Administered 2022-07-08: 50 ug via INTRAVENOUS

## 2022-07-08 MED ORDER — CEFAZOLIN SODIUM-DEXTROSE 2-4 GM/100ML-% IV SOLN
2.0000 g | INTRAVENOUS | Status: AC
  Administered 2022-07-08: 2 g via INTRAVENOUS
  Filled 2022-07-08: qty 100

## 2022-07-08 MED ORDER — FENTANYL CITRATE (PF) 100 MCG/2ML IJ SOLN
INTRAMUSCULAR | Status: DC | PRN
  Administered 2022-07-08 (×2): 50 ug via INTRAVENOUS

## 2022-07-08 MED ORDER — BUPIVACAINE LIPOSOME 1.3 % IJ SUSP: 20.0000 mL | Freq: Once | INTRAMUSCULAR | Status: DC

## 2022-07-08 MED ORDER — LIDOCAINE HCL (PF) 2 % IJ SOLN
INTRAMUSCULAR | Status: AC
  Filled 2022-07-08: qty 5

## 2022-07-08 MED ORDER — OXYCODONE HCL 5 MG PO TABS
5.0000 mg | ORAL_TABLET | Freq: Once | ORAL | Status: AC | PRN
  Administered 2022-07-08: 5 mg via ORAL

## 2022-07-08 MED ORDER — PROPOFOL 10 MG/ML IV BOLUS
INTRAVENOUS | Status: AC
  Filled 2022-07-08: qty 20

## 2022-07-08 MED ORDER — DEXAMETHASONE SODIUM PHOSPHATE 10 MG/ML IJ SOLN
INTRAMUSCULAR | Status: DC | PRN
  Administered 2022-07-08: 4 mg via INTRAVENOUS

## 2022-07-08 MED ORDER — LACTATED RINGERS IV SOLN: INTRAVENOUS | Status: DC | PRN

## 2022-07-08 MED ORDER — CHLORHEXIDINE GLUCONATE 0.12 % MT SOLN
15.0000 mL | Freq: Once | OROMUCOSAL | Status: AC
  Administered 2022-07-08: 15 mL via OROMUCOSAL

## 2022-07-08 MED ORDER — EPHEDRINE SULFATE-NACL 50-0.9 MG/10ML-% IV SOSY
PREFILLED_SYRINGE | INTRAVENOUS | Status: DC | PRN
  Administered 2022-07-08: 10 mg via INTRAVENOUS

## 2022-07-08 MED ORDER — OXYCODONE HCL 5 MG/5ML PO SOLN: 5.0000 mg | Freq: Once | ORAL | Status: AC | PRN

## 2022-07-08 MED ORDER — LACTATED RINGERS IV SOLN: INTRAVENOUS | Status: DC

## 2022-07-08 MED ORDER — ACETAMINOPHEN 10 MG/ML IV SOLN
1000.0000 mg | Freq: Once | INTRAVENOUS | Status: DC | PRN
  Administered 2022-07-08: 1000 mg via INTRAVENOUS

## 2022-07-08 MED ORDER — BUPIVACAINE LIPOSOME 1.3 % IJ SUSP
INTRAMUSCULAR | Status: DC | PRN
  Administered 2022-07-08: 50 mL

## 2022-07-08 MED ORDER — SUGAMMADEX SODIUM 200 MG/2ML IV SOLN
INTRAVENOUS | Status: DC | PRN
  Administered 2022-07-08: 200 mg via INTRAVENOUS

## 2022-07-08 MED ORDER — ROCURONIUM BROMIDE 10 MG/ML (PF) SYRINGE
PREFILLED_SYRINGE | INTRAVENOUS | Status: AC
  Filled 2022-07-08: qty 20

## 2022-07-08 SURGICAL SUPPLY — 62 items
ADH SKN CLS APL DERMABOND .7 (GAUZE/BANDAGES/DRESSINGS) ×1
ANTIFOG SOL W/FOAM PAD STRL (MISCELLANEOUS) ×1
APL PRP STRL LF DISP 70% ISPRP (MISCELLANEOUS) ×1
BAG COUNTER SPONGE SURGICOUNT (BAG) IMPLANT
BAG SPNG CNTER NS LX DISP (BAG)
BLADE SURG SZ11 CARB STEEL (BLADE) ×2 IMPLANT
CANNULA REDUC XI 12-8 STAPL (CANNULA)
CANNULA REDUCER 12-8 DVNC XI (CANNULA) IMPLANT
CHLORAPREP W/TINT 26 (MISCELLANEOUS) ×2 IMPLANT
COVER MAYO STAND STRL (DRAPES) ×2 IMPLANT
COVER SURGICAL LIGHT HANDLE (MISCELLANEOUS) ×2 IMPLANT
COVER TIP SHEARS 8 DVNC (MISCELLANEOUS) ×2 IMPLANT
COVER TIP SHEARS 8MM DA VINCI (MISCELLANEOUS) ×1
DEFOGGER SCOPE WARMER CLEARIFY (MISCELLANEOUS) IMPLANT
DERMABOND ADVANCED .7 DNX12 (GAUZE/BANDAGES/DRESSINGS) IMPLANT
DRAPE ARM DVNC X/XI (DISPOSABLE) ×6 IMPLANT
DRAPE COLUMN DVNC XI (DISPOSABLE) ×2 IMPLANT
DRAPE DA VINCI XI ARM (DISPOSABLE) ×3
DRAPE DA VINCI XI COLUMN (DISPOSABLE) ×1
DRSG TEGADERM 2-3/8X2-3/4 SM (GAUZE/BANDAGES/DRESSINGS) ×6 IMPLANT
ELECT REM PT RETURN 15FT ADLT (MISCELLANEOUS) ×2 IMPLANT
GAUZE SPONGE 2X2 STRL 8-PLY (GAUZE/BANDAGES/DRESSINGS) IMPLANT
GLOVE BIO SURGEON STRL SZ7.5 (GLOVE) ×2 IMPLANT
GLOVE INDICATOR 8.0 STRL GRN (GLOVE) ×2 IMPLANT
GOWN STRL REUS W/ TWL XL LVL3 (GOWN DISPOSABLE) ×4 IMPLANT
GOWN STRL REUS W/TWL XL LVL3 (GOWN DISPOSABLE) ×2
GRASPER SUT TROCAR 14GX15 (MISCELLANEOUS) ×2 IMPLANT
IRRIG SUCT STRYKERFLOW 2 WTIP (MISCELLANEOUS)
IRRIGATION SUCT STRKRFLW 2 WTP (MISCELLANEOUS) IMPLANT
KIT BASIN OR (CUSTOM PROCEDURE TRAY) ×2 IMPLANT
KIT TURNOVER KIT A (KITS) IMPLANT
MARKER SKIN DUAL TIP RULER LAB (MISCELLANEOUS) ×2 IMPLANT
MESH 3DMAX MID 4X6 RT LRG (Mesh General) IMPLANT
NEEDLE HYPO 22GX1.5 SAFETY (NEEDLE) ×2 IMPLANT
OBTURATOR OPTICAL STANDARD 8MM (TROCAR) ×1
OBTURATOR OPTICAL STND 8 DVNC (TROCAR) ×1
OBTURATOR OPTICALSTD 8 DVNC (TROCAR) ×2 IMPLANT
PACK CARDIOVASCULAR III (CUSTOM PROCEDURE TRAY) ×2 IMPLANT
PAD POSITIONING PINK XL (MISCELLANEOUS) ×2 IMPLANT
SCISSORS LAP 5X35 DISP (ENDOMECHANICALS) IMPLANT
SEAL CANN UNIV 5-8 DVNC XI (MISCELLANEOUS) ×4 IMPLANT
SEAL XI 5MM-8MM UNIVERSAL (MISCELLANEOUS) ×2
SOL ELECTROSURG ANTI STICK (MISCELLANEOUS) ×1
SOLUTION ANTFG W/FOAM PAD STRL (MISCELLANEOUS) ×2 IMPLANT
SOLUTION ELECTROSURG ANTI STCK (MISCELLANEOUS) ×2 IMPLANT
SPIKE FLUID TRANSFER (MISCELLANEOUS) ×2 IMPLANT
STAPLER CANNULA SEAL DVNC XI (STAPLE) ×2 IMPLANT
STAPLER CANNULA SEAL XI (STAPLE) ×1
STRIP CLOSURE SKIN 1/2X4 (GAUZE/BANDAGES/DRESSINGS) IMPLANT
SUT MNCRL AB 4-0 PS2 18 (SUTURE) ×2 IMPLANT
SUT VIC AB 2-0 SH 27 (SUTURE)
SUT VIC AB 2-0 SH 27X BRD (SUTURE) IMPLANT
SUT VIC AB 3-0 SH 27 (SUTURE) ×2
SUT VIC AB 3-0 SH 27XBRD (SUTURE) ×4 IMPLANT
SUT VICRYL 0 UR6 27IN ABS (SUTURE) ×2 IMPLANT
SUT VLOC 180 3-0 9IN GS21 (SUTURE) ×2 IMPLANT
SYR 20ML LL LF (SYRINGE) ×2 IMPLANT
TAPE STRIPS DRAPE STRL (GAUZE/BANDAGES/DRESSINGS) ×2 IMPLANT
TOWEL OR 17X26 10 PK STRL BLUE (TOWEL DISPOSABLE) ×2 IMPLANT
TOWEL OR NON WOVEN STRL DISP B (DISPOSABLE) ×2 IMPLANT
TROCAR XCEL NON-BLD 5MMX100MML (ENDOMECHANICALS) ×2 IMPLANT
TUBING INSUFFLATION 10FT LAP (TUBING) ×2 IMPLANT

## 2022-07-08 NOTE — Anesthesia Postprocedure Evaluation (Signed)
Anesthesia Post Note  Patient: Adrian [name]  Procedure(s) Performed: XI ROBOTIC ASSISTED RIGHT INGUINAL HERNIA WITH MESH (Right)     Patient location during evaluation: PACU Anesthesia Type: General Level of consciousness: awake and patient cooperative Pain management: pain level controlled Vital Signs Assessment: post-procedure vital signs reviewed and stable Respiratory status: spontaneous breathing, nonlabored ventilation, respiratory function stable and patient connected to nasal cannula oxygen Cardiovascular status: blood pressure returned to baseline and stable Postop Assessment: no apparent nausea or vomiting Anesthetic complications: no   No notable events documented.  Last Vitals:  Vitals:   07/08/22 1442 07/08/22 1445  BP: 139/60 (!) 144/79  Pulse: (!) 56 (!) 59  Resp: 10 14  Temp: 36.9 C   SpO2: 98% 98%    Last Pain:  Vitals:   07/08/22 0832  TempSrc: Oral                 Tyasia Packard

## 2022-07-08 NOTE — Anesthesia Procedure Notes (Signed)
Procedure Name: Intubation Date/Time: 07/08/2022 12:02 PM  Performed by: Lind Covert, CRNAPre-anesthesia Checklist: Patient identified, Emergency Drugs available, Suction available, Patient being monitored and Timeout performed Patient Re-evaluated:Patient Re-evaluated prior to induction Oxygen Delivery Method: Circle system utilized Preoxygenation: Pre-oxygenation with 100% oxygen Induction Type: IV induction Ventilation: Mask ventilation without difficulty Laryngoscope Size: Mac and 4 Grade View: Grade I Tube type: Oral Tube size: 7.5 mm Number of attempts: 1 Airway Equipment and Method: Stylet Placement Confirmation: ETT inserted through vocal cords under direct vision, breath sounds checked- equal and bilateral and positive ETCO2 Secured at: 23 cm Tube secured with: Tape Dental Injury: Teeth and Oropharynx as per pre-operative assessment

## 2022-07-08 NOTE — Discharge Instructions (Addendum)
[facility]: POST OP INSTRUCTIONS Always review your discharge instruction sheet given to you by the facility where your surgery was performed. IF YOU HAVE DISABILITY OR FAMILY LEAVE FORMS, YOU MUST BRING THEM TO THE OFFICE FOR PROCESSING.   DO NOT GIVE THEM TO YOUR DOCTOR.  PAIN CONTROL  First take acetaminophen (Tylenol) AND/or ibuprofen (Advil) to control your pain after surgery.  Follow directions on package.  Taking acetaminophen (Tylenol) and/or ibuprofen (Advil) regularly after surgery will help to control your pain and lower the amount of prescription pain medication you may need.  You should not take more than 3,000 mg (3 grams) of acetaminophen (Tylenol) in 24 hours.  You should not take ibuprofen (Advil), aleve, motrin, naprosyn or other NSAIDS if you have a history of stomach ulcers or chronic kidney disease.  A prescription for pain medication may be given to you upon discharge.  Take your pain medication as prescribed, if you still have uncontrolled pain after taking acetaminophen (Tylenol) or ibuprofen (Advil). Use ice packs to help control pain. If you need a refill on your pain medication, please contact your pharmacy.  They will contact our office to request authorization. Prescriptions will not be filled after 5pm or on week-ends.  HOME MEDICATIONS Take your usually prescribed medications unless otherwise directed.  DIET You should follow a light diet the first few days after arrival home.  Be sure to include lots of fluids daily. Avoid fatty, fried foods.   CONSTIPATION It is common to experience some constipation after surgery and if you are taking pain medication.  Increasing fluid intake and taking a stool softener (such as Colace) will usually help or prevent this problem from occurring.  A mild laxative (Milk of Magnesia or Miralax) should be taken according to package instructions if there are no bowel movements after 48  hours.  WOUND/INCISION CARE Most patients will experience some swelling and bruising in the area of the incisions.  Ice packs will help.  Swelling and bruising can take several days to resolve.  Unless discharge instructions indicate otherwise, follow guidelines below  STERI-STRIPS - you may remove your outer bandages 48 hours after surgery, and you may shower at that time.  You have steri-strips (small skin tapes) in place directly over the incision.  These strips should be left on the skin for 7-10 days.   DERMABOND/SKIN GLUE - you may shower in 24 hours.  The glue will flake off over the next 2-3 weeks. Any sutures or staples will be removed at the office during your follow-up visit.  ACTIVITIES You may resume regular (light) daily activities beginning the next day--such as daily self-care, walking, climbing stairs--gradually increasing activities as tolerated.  You may have sexual intercourse when it is comfortable.  Refrain from any heavy lifting or straining until approved by your doctor. You may drive when you are no longer taking prescription pain medication, you can comfortably wear a seatbelt, and you can safely maneuver your car and apply brakes.  FOLLOW-UP You should see your doctor in the office for a follow-up appointment approximately 2-3 weeks after your surgery.  You should have been given your post-op/follow-up appointment when your surgery was scheduled.  If you did not receive a post-op/follow-up appointment, make sure that you call for this appointment within a day or two after you arrive home to insure a convenient appointment time.  OTHER INSTRUCTIONS DO NOT LIFT/PUSH/PULL ANYTHING GREATER THAN 10 LBS FOR 1 MONTH  WHEN TO  CALL YOUR DOCTOR: Fever over 101.0 Inability to urinate Continued bleeding from incision. Increased pain, redness, or drainage from the incision. Increasing abdominal pain  The clinic staff is available to answer your questions during regular  business hours.  Please don't hesitate to call and ask to speak to one of the nurses for clinical concerns.  If you have a medical emergency, go to the nearest emergency room or call 911.  A surgeon from [facility] is always on call at the hospital. [address] [place], [place], [place]  [address] ? [address], [place], [place]   [address] [phone] ? [phone] ? FAX [phone] Web site: [email or web address]

## 2022-07-08 NOTE — Anesthesia Preprocedure Evaluation (Signed)
Anesthesia Evaluation  Patient identified by MRN, date of birth, ID band Patient awake    Reviewed: Allergy & Precautions, NPO status , Patient's Chart, lab work & pertinent test results  History of Anesthesia Complications Negative for: history of anesthetic complications  Airway Mallampati: III  TM Distance: >3 FB Neck ROM: Full    Dental  (+) Dental Advisory Given   Pulmonary neg shortness of breath, neg sleep apnea, neg COPD, neg recent URI, former smoker   breath sounds clear to auscultation       Cardiovascular hypertension, (-) angina + CAD   Rhythm:Regular   There was no ST segment deviation noted during stress.  No T wave inversion was noted during stress.   1. Negative ECG stress test for ischemia but only achieved 84% MPHR. Cannot exclude ischemia at higher heart rate.  2. Average exercise capacity (6:20 min:s; 7.0 METS).  3. Normal HR/BP response to exercise.  4. 84% MPHR achieved as noted above.       Neuro/Psych  PSYCHIATRIC DISORDERS  Depression    parkinson's  Neuromuscular disease    GI/Hepatic Neg liver ROS,GERD  ,,  Endo/Other  Hypothyroidism    Renal/GU Renal disease     Musculoskeletal  (+) Arthritis ,    Abdominal   Peds  Hematology Lab Results      Component                Value               Date                      WBC                      4.7                 06/30/2022                HGB                      14.8                06/30/2022                HCT                      43.7                06/30/2022                MCV                      93.4                06/30/2022                PLT                      144 (L)             06/30/2022              Anesthesia Other Findings   Reproductive/Obstetrics                              Anesthesia Physical Anesthesia Plan  ASA: 3  Anesthesia Plan: General   Post-op  Pain Management: Tylenol PO  (pre-op)*   Induction: Intravenous  PONV Risk Score and Plan: 2 and Ondansetron and Dexamethasone  Airway Management Planned: Oral ETT  Additional Equipment: None  Intra-op Plan:   Post-operative Plan: Extubation in OR  Informed Consent: I have reviewed the patients History and Physical, chart, labs and discussed the procedure including the risks, benefits and alternatives for the proposed anesthesia with the patient or authorized representative who has indicated his/her understanding and acceptance.     Dental advisory given  Plan Discussed with: CRNA  Anesthesia Plan Comments:          Anesthesia Quick Evaluation

## 2022-07-08 NOTE — H&P (Signed)
REFERRING PHYSICIAN: Plotnikov, Evie Lacks, MD  PROVIDER: Lilyana Lippman Leanne Chang, MD  MRN: [id number] DOB: March 08, 1951 DATE OF ENCOUNTER: 06/23/2022  Subjective  Chief Complaint: New Consultation (Unilateral Inguinal Hernia )   History of Present Illness: Adrian [name] is a 72 y.o. male who is seen today as an office consultation at the request of Dr. Alain [place] for evaluation of New Consultation (Unilateral Inguinal Hernia ) .  He has a past medical history of chronic kidney disease and Parkinson's disease. He states that he had a bulge in his right groin for 3 to 4 weeks. He saw his PCP for his annual physical a few weeks ago who confirmed an inguinal hernia and he was referred here. It causes discomfort but no burning, shooting or stabbing pain. He had a prior repair in his left groin about 32 years ago. He takes medicine for Parkinson's disease and as a result he has some constipation. He takes medicine such as fiber and senna. He has a bowel movement at least every other day if not daily. He had a cardiac stress test in 2022 which was unremarkable. No chest pain or chest pressure or shortness of breath. No prior abdominal surgery. He is interested in a minimally invasive approach. He is accompanied by his wife    Review of Systems: A complete review of systems was obtained from the patient. I have reviewed this information and discussed as appropriate with the patient. See HPI as well for other ROS.  ROS  Medical History: Past Medical History: Diagnosis Date Cataract Chronic kidney disease Hyperlipidemia Hypertension Parkinson disease Thyroid disease Tremor Trigeminal neuralgia  Patient Active Problem List Diagnosis Parkinson's disease Parkinson disease  Past Surgical History: Procedure Laterality Date BLEPHAROPLASTY CATARACT EXTRACTION gamma knife surgery for trigeminal neuralgia INGUINAL HERNIA REPAIR left knee surgery microvascular decompression radial  keratotomy   Allergies Allergen Reactions Neomycin-Bacitracin-Poly-Hc Rash Neomycin-Bacitracin-Polymyxin Rash Adhesive Rash Latex, Natural Rubber Rash Other Hives Allergic to bee's  Current Outpatient Medications on File Prior to Visit Medication Sig Dispense Refill amantadine HCL (SYMMETREL) 100 mg tablet Take 1 tablet (100 mg total) by mouth 2 (two) times daily 1 pill morning and 1 pill early afternoon 180 tablet 3 aspirin 81 MG EC tablet Take 81 mg by mouth once daily carbidopa-levodopa (SINEMET) 25-100 mg tablet Take 2 pills 4 times per day and 1 pill overnight as needed for wearing off meds 810 tablet 3 cetirizine (ZYRTEC) 10 MG tablet Take 10 mg by mouth once daily cholecalciferol (VITAMIN D3) 1000 unit tablet Take by mouth colchicine (COLCRYS) 0.6 mg tablet Take 0.6 mg by mouth as needed COLLAGEN MISC Use entacapone (COMTAN) 200 mg tablet Take 1 tablet (200 mg total) by mouth 4 (four) times daily 360 tablet 3 fluticasone propionate (FLOVENT DISKUS) 50 mcg/actuation diskus inhaler Inhale 1 Puff into the lungs 2 (two) times daily gabapentin (NEURONTIN) 100 MG capsule Take 2 capsules (200 mg total) by mouth at bedtime halobetasol (ULTRAVATE) 0.05 % cream Apply topically 2 (two) times daily levothyroxine (SYNTHROID) 75 MCG tablet Take 75 mcg by mouth once daily Take on an empty stomach with a glass of water at least 30-60 minutes before breakfast. losartan (COZAAR) 100 MG tablet Take 100 mg by mouth once daily methocarbamoL (ROBAXIN) 500 MG tablet Take 250 mg by mouth at bedtime multivitamin-lutein (CENTRUM SILVER) tablet Take 1 tablet by mouth once daily rosuvastatin (CRESTOR) 10 MG tablet Take 10 mg by mouth once daily sertraline (ZOLOFT) 100 MG tablet Take 1 tablet by mouth once daily 90  tablet 3 sildenafiL (VIAGRA) 100 MG tablet Take 100 mg by mouth as needed sour cherry extract (TART CHERRY EXTRACT) 1,000 mg Cap Take by mouth once daily traZODone (DESYREL) 50 MG tablet Take  50 mg by mouth nightly triamcinolone 0.1 % cream Apply 1 Dose topically 2 (two) times daily TURMERIC ORAL Take 1,000 mg by mouth once daily VALERIAN ORAL Take by mouth once daily ZINC ORAL Take by mouth once daily  No current facility-administered medications on file prior to visit.  Family History Problem Relation Age of Onset Parkinsonism Sister Parkinsonism Brother Diabetes Brother   Social History  Tobacco Use Smoking Status Former Packs/day: 0.00 Years: 0.00 Additional pack years: 0.00 Total pack years: 0.00 Types: Cigarettes Quit date: 05/14/1991 Years since quitting: 31.1 Smokeless Tobacco Never   Social History  Socioeconomic History Marital status: Married Tobacco Use Smoking status: Former Packs/day: 0.00 Years: 0.00 Additional pack years: 0.00 Total pack years: 0.00 Types: Cigarettes Quit date: 05/14/1991 Years since quitting: 31.1 Smokeless tobacco: Never Substance and Sexual Activity Alcohol use: Yes Alcohol/week: 14.0 standard drinks of alcohol Types: 14 Glasses of wine per week Drug use: Never Sexual activity: Yes Partners: Female Birth control/protection: Post-menopausal  Objective:  Vitals: 06/23/22 0843 06/23/22 0851 BP: 133/78 Pulse: 54 Temp: 36.5 C (97.7 F) SpO2: 96% Weight: 89.1 kg (196 lb 6.4 oz) Height: 175.3 cm ('5\' 9"'$ ) PainSc: 1 PainLoc: Abdomen  Body mass index is 29 kg/m.  Constitutional: NAD; conversant; no deformities Eyes: Moist conjunctiva; no lid lag; anicteric; PERRL Neck: Trachea midline; no thyromegaly Lungs: Normal respiratory effort; no tactile fremitus CV: RRR; no palpable thrills; no pitting edema GI: Abd soft, nontender,; no palpable hepatosplenomegaly GU: Old left inguinal incision. Bulge in right groin. Reducible. Both testicles descended MSK: Normal gait; no clubbing/cyanosis; slight tremor Psychiatric: Appropriate affect; alert and oriented x3 Lymphatic: No palpable cervical or axillary  lymphadenopathy Skin: No rash, lesions or jaundice  Labs, Imaging and Diagnostic Testing: Pcp note 06/04/22 Labs 06/04/22 - cmet, cbc, tsh, psa, lipid panel (some CKD - Cr 1.37, GFR 52) 06/18/20 - cardiac stress test - negative Assessment and Plan:   Diagnoses and all orders for this visit:  Right inguinal hernia  Parkinson's disease, unspecified whether dyskinesia present, unspecified whether manifestations fluctuate  Chronic kidney disease, unspecified CKD stage    We discussed the etiology of inguinal hernias. We discussed the signs & symptoms of incarceration & strangulation. We discussed non-operative and operative management. We discussed open and minimally invasive approaches (laparoscopic/robotic)  The patient has elected robotic repair of right inguinal hernia with mesh  I described the procedure in detail. The patient was given Neurosurgeon. We discussed the risks and benefits including but not limited to bleeding, infection, chronic inguinal pain, nerve entrapment/chronic pain, hernia recurrence, mesh complications, hematoma formation, urinary retention, injury to the testicle, numbness in the groin, blood clots, injury to the surrounding structures, and anesthesia risk. We also discussed the typical post operative recovery course, including no heavy lifting for 4-6 weeks. I explained that the likelihood of improvement of their symptoms is good  This patient encounter took 35 minutes today to perform the following: take history, perform exam, review outside records, interpret imaging, counsel the patient on their diagnosis and document encounter, findings & plan in the EHR  No follow-ups on file.  Anwitha Mapes Leanne Chang, MD General, Minimally Invasive, & Bariatric Surgery     Electronically signed by Rudean Curt, MD at 06/23/2022 9:21 AM EST

## 2022-07-08 NOTE — Transfer of Care (Signed)
Immediate Anesthesia Transfer of Care Note  Patient: Adrian [name]  Procedure(s) Performed: XI ROBOTIC ASSISTED RIGHT INGUINAL HERNIA WITH MESH (Right)  Patient Location: PACU  Anesthesia Type:General  Level of Consciousness: awake, alert , and patient cooperative  Airway & Oxygen Therapy: Patient Spontanous Breathing and Patient connected to face mask oxygen  Post-op Assessment: Report given to RN, Post -op Vital signs reviewed and stable, and Patient moving all extremities X 4  Post vital signs: Reviewed and stable  Last Vitals:  Vitals Value Taken Time  BP 139/60 07/08/22 1442  Temp    Pulse 59 07/08/22 1444  Resp 11 07/08/22 1444  SpO2 98 % 07/08/22 1444  Vitals shown include unvalidated device data.  Last Pain:  Vitals:   07/08/22 0832  TempSrc: Oral         Complications: No notable events documented.

## 2022-07-08 NOTE — Op Note (Signed)
PREOPERATIVE DIAGNOSIS: RIGHT inguinal hernia.    POSTOPERATIVE DIAGNOSIS: right  indirect  inguinal hernia   PROCEDURE: Robotic/XI repair of right indirect inguinal hernia with  mesh (rTAPP).  Laparoscopic bilateral tap block   SURGEON: Leighton Ruff. Redmond Pulling, MD FACS   ASSISTANT SURGEON: None.    ANESTHESIA: General plus local consisting of 0.25% Marcaine with Exparel   ESTIMATED BLOOD LOSS: Minimal.    FINDINGS: The patient had a right indirect inguinal hernia.  It was repaired using Bard 3d mid weight mesh    SPECIMEN: Cord lipoma-discarded   INDICATIONS FOR PROCEDURE: 72 yo presented for repair of a symptomatic inguinal hernia. The risks and benefits including but not limited to bleeding, infection, chronic inguinal pain, nerve entrapment, hernia recurrence, mesh complications, hematoma formation, urinary retention, injury to the testicles or the ovaries, numbness in the groin, blood clots, injury to the surrounding structures, and anesthesia risk was discussed with the patient.   DESCRIPTION OF PROCEDURE: After obtaining verbal consent the patient was then taken back to the operating room, placed  supine on the operating room table. General endotracheal anesthesia was  established. The patient had emptied their bladder prior to going back to  the operating room. Sequential compression devices were placed. The  abdomen and groin were prepped and draped in the usual standard surgical  fashion with ChloraPrep. The patient received oral Tylenol preoperatively as well as IV  antibiotics prior to the incision. A surgical time-out was performed.   Optical entry was performed about 8 cm to the left of the umbilicus and about 2 cm below the left subcostal margin.  Using a 0 degree 5 mm laparoscope through a 5 mm trocar I advanced it through all layers of the abdominal wall and carefully entered the abdominal cavity.  Pneumoperitoneum was smoothly established up to a patient pressure of 15 mmHg.   There was no evidence of injury to surrounding structures.  Of note the patient's abdominal wall was very thin and attenuated.  A robotic 8 mm trocar was placed in the supraumbilical position approximately 18 cm from the pubic bone.  An additional 8 mm robotic trocar was placed in the right lateral abdominal wall about 8 cm lateral to the midline trocar.  I then replaced to the optical entry trocar with an 8 mm robotic trocar.  I then performed a bilateral laparoscopic tap block for postoperative pain relief.  The patient was placed in Trendelenburg position.  There is no evidence of contralateral inguinal hernia.  However unfortunately the patient had a somewhat moderately distended bladder.  I felt that we needed to decompress his bladder in order to get good medial and inferior dissection and to make sure the mesh was well-placed.  Therefore the patient was placed back supine and the circulator nurse placed a Foley catheter underneath the surgical drape sterilely.  We then went back laparoscopically and put the patient back in Trendelenburg position.  The patient's bladder was decompressed at this point.  A large piece of right Bard 3D mid weight mesh were placed through the 77m trocar into the abdominal cavity.  I went ahead and placed a  two 3-0 Vicryl suture on SH into the abdomen off to the side.  I then placed one 3-0 absorbable V-Loc sutures through the trocar into the abdomen off to the side. We then deployed the robot for pelvic surgery from patient's left.  The robot was docked.  Robotic laparoscope was placed through the supraumbilical trocar and the anatomy was  targeted.  The other arms were then connected to the trochars.  A pair of MCS scissors was placed through the right trocar and a fenestrated bipolar through the left trocar all under direct visualization.  I then scrubbed out and went to the robotic console.    I then made incision along the peritoneum on the right, starting 2 inches  above the anterior superior iliac spine and caring it medial toward the median umbilical ligament in a lazy S configuration using MCS scissors with electrocautery. The peritoneal flap was then gently dissected downward from the anterior abdominal wall taking care not to  injure the inferior epigastric vessels. The pubic bone was identified. The testicular vessels were identified.  Using traction and counter traction, I reduced the sac in  its entirety. The testicular vessels had been identified and preserved. The vas deferens was identified and preserved, and the hernia sac was stripped from those to  surrounding structures.   He did not have a large hernia sac.  However he had very significant cord lipoma.  I dissected the cord lipoma away from the cord structures carefully.  I ended up taking it into different pieces.  I then went about creating a large pocket by lifting the peritoneum of the pelvic floor. I took great care not to injure the iliac vessels.   Laterally the peritoneal flap was fairly adhered to the fat tissue along the right lateral abdominal wall.   I then obtained the previously placed piece of Bard large 3D midweight mesh for the right groin and placed it into the inguinal area.   half of it covered medial  to the inferior epigastric vessels and half of it lateral to the  inferior epigastric vessels. The defect was well  covered with the mesh.  I then secured the mesh to Cooper's ligament with two interrupted 3-0 Vicryl suture.  I placed an additional suture superior medially along the edge of the mesh medial to the inferior gastric vessel.  I placed a 3 Vicryl suture laterally along the superior lateral edge of the mesh lateral to the inferior epigastric vessels.  I then closed the peritoneal flap with a running 3-0 Vicryl V-Loc.  The mesh was well covered.  There is no defect in the peritoneal flap.  The mesh was flat.  It had not curled up.   The surgical robot was undocked and moved  away from the OR table.  I scrubbed back in.   we then placed a laparoscopic needle driver and all the needles that had been placed in the abdomen at the begin the case were removed.  I also removed the cord lipoma in 2 pieces.   Given how attenuated his abdominal wall muscle was I felt that he was at risk for potential trocar site hernias even though these were 8 mm trocar sites.  Using a PMI suture passer I placed a single interrupted 0 Vicryl at the left mid abdominal trocar site and tied it down using laparoscopic robotic assistance.  I did the same thing for the supraumbilical trocar site as well.  Pneumoperitoneum was released,  I then removed the remaining trocar.  Then using a 0 Vicryl on a UR 6 needle I closed that attenuated fascia as well.  Remaining local was infiltrated.     All skin incisions were closed with a 4-0 Monocryl in a subcuticular fashion followed by application of Dermabond. All needle, instrument, and sponge counts  were correct x2.   There are  no immediate complications. The patient tolerated the procedure well.  Only catheter was removed.  The patient was extubated and taken to the  recovery room in stable condition.

## 2022-07-08 NOTE — Interval H&P Note (Signed)
History and Physical Interval Note:  07/08/2022 11:36 AM  Adrian [name]  has presented today for surgery, with the diagnosis of [place].  The various methods of treatment have been discussed with the patient and family. After consideration of risks, benefits and other options for treatment, the patient has consented to  Procedure(s): XI ROBOTIC ASSISTED RIGHT INGUINAL HERNIA WITH MESH (Right) as a surgical intervention.  The patient's history has been reviewed, patient examined, no change in status, stable for surgery.  I have reviewed the patient's chart and labs.  Questions were answered to the patient's satisfaction.     [name]

## 2022-07-27 DIAGNOSIS — N1831 Chronic kidney disease, stage 3a: Secondary | ICD-10-CM | POA: Diagnosis not present

## 2022-07-27 DIAGNOSIS — N1330 Unspecified hydronephrosis: Secondary | ICD-10-CM | POA: Diagnosis not present

## 2022-07-27 DIAGNOSIS — I129 Hypertensive chronic kidney disease with stage 1 through stage 4 chronic kidney disease, or unspecified chronic kidney disease: Secondary | ICD-10-CM | POA: Diagnosis not present

## 2022-07-27 DIAGNOSIS — G20A1 Parkinson's disease without dyskinesia, without mention of fluctuations: Secondary | ICD-10-CM | POA: Diagnosis not present

## 2022-07-27 DIAGNOSIS — N261 Atrophy of kidney (terminal): Secondary | ICD-10-CM | POA: Diagnosis not present

## 2022-07-27 DIAGNOSIS — N200 Calculus of kidney: Secondary | ICD-10-CM | POA: Diagnosis not present

## 2022-07-27 DIAGNOSIS — E785 Hyperlipidemia, unspecified: Secondary | ICD-10-CM | POA: Diagnosis not present

## 2022-07-27 LAB — PROTEIN / CREATININE RATIO, URINE
Albumin, U: 7.2
Creatinine, Urine: 178.5

## 2022-07-27 LAB — MICROALBUMIN / CREATININE URINE RATIO: Microalb Creat Ratio: 4

## 2022-07-28 LAB — LAB REPORT - SCANNED
Creatinine, POC: 178.5 mg/dL
Microalb Creat Ratio: 4
Microalbumin, Urine: 7.2

## 2022-08-04 ENCOUNTER — Encounter: Payer: Self-pay | Admitting: Internal Medicine

## 2022-08-30 DIAGNOSIS — G20B1 Parkinson's disease with dyskinesia, without mention of fluctuations: Secondary | ICD-10-CM | POA: Diagnosis not present

## 2022-08-30 DIAGNOSIS — K5909 Other constipation: Secondary | ICD-10-CM | POA: Diagnosis not present

## 2022-08-30 DIAGNOSIS — F32A Depression, unspecified: Secondary | ICD-10-CM | POA: Diagnosis not present

## 2022-09-13 ENCOUNTER — Ambulatory Visit: Payer: Medicare Other | Attending: Cardiovascular Disease | Admitting: Cardiovascular Disease

## 2022-09-13 ENCOUNTER — Encounter: Payer: Self-pay | Admitting: Cardiovascular Disease

## 2022-09-13 VITALS — BP 120/70 | HR 51 | Ht 69.0 in | Wt 189.4 lb

## 2022-09-13 DIAGNOSIS — I7121 Aneurysm of the ascending aorta, without rupture: Secondary | ICD-10-CM

## 2022-09-13 DIAGNOSIS — N1831 Chronic kidney disease, stage 3a: Secondary | ICD-10-CM | POA: Diagnosis not present

## 2022-09-13 DIAGNOSIS — I10 Essential (primary) hypertension: Secondary | ICD-10-CM | POA: Diagnosis not present

## 2022-09-13 DIAGNOSIS — E785 Hyperlipidemia, unspecified: Secondary | ICD-10-CM | POA: Diagnosis not present

## 2022-09-13 DIAGNOSIS — I2510 Atherosclerotic heart disease of native coronary artery without angina pectoris: Secondary | ICD-10-CM | POA: Diagnosis not present

## 2022-09-13 NOTE — Progress Notes (Signed)
Cardiology Office Note:    Date:  09/19/2022   ID:  Adrian [name], DOB 1950-09-05, MRN [id number]  PCP:  Adrian Garter, MD  Cardiologist:  Adrian Fair, MD  Electrophysiologist:  None   Referring MD: Adrian Garter, MD   Chief Complaint  Patient presents with   aorta dilation     History of Present Illness:    Adrian [name] is a 72 y.o. male with a hx of hypercholesterolemia, hypertension, family history of coronary artery disease, moderate chronic kidney disease (atrophic right kidney without renal artery stenosis), Parkinson's disease, elevated calcium score of 630 (80th percentile) and a mildly dilated ascending aorta.  After the dilated ascending aorta was initially diagnosed on a coronary calcium score CT, he underwent an MRI which showed a maximum diameter of the ascending aorta at 4.1 cm.  He had a normal treadmill stress test after his elevated coronary calcium score.  He remains completely asymptomatic.  The patient specifically denies any chest pain at rest exertion, dyspnea at rest or with exertion, orthopnea, paroxysmal nocturnal dyspnea, syncope, palpitations, focal neurological deficits, intermittent claudication, lower extremity edema, unexplained weight gain, cough, hemoptysis or wheezing.  His symptoms of Parkinson's disease are well-controlled on carbidopa-levodopa and amantadine.  He continues to ride his bike or walk 2 miles on alternating days.  On statin therapy his most recent LDL cholesterol was excellent at 34.  His most recent hemoglobin A1c was in normal range.  Creatinine is mildly elevated at 1.44 but electrolytes are normal.  His blood pressure is consistently in normal range.  [name] has no symptoms of coronary disease.  Every day he either rides his bike for 20 minutes or goes on a 2 mile walk.  He splits firewood with an axe and carries that wood into his home, without problems with shortness of breath or chest discomfort.  He does describe  occasional palpitations are consistently associated with anxiety.  He does not have leg edema and denies intermittent claudication.  He has resting tremor a little worse in his left hand compared to the right that has been become steadily more noticeable over the last couple of years.  He does not have other focal neurological events and does not have a history of stroke or TIA.  Imaging studies have showed that he has an atrophied right kidney but no evidence of right renal artery stenosis.  He has mild left hydronephrosis.     Past Medical History:  Diagnosis Date   Allergic rhinitis    Arthritis    Cancer 2016   basal cell skin cancer nose   Cataract    Chronic kidney disease    Depression    GERD (gastroesophageal reflux disease)    Hyperlipidemia    Hypertension    Hypothyroidism 2010   mild   Hypothyroidism    Parkinson disease    Trigeminal neuralgia    L.   Vitamin B12 deficiency 2010    Past Surgical History:  Procedure Laterality Date   ARTHRODESIS METATARSALPHALANGEAL JOINT (MTPJ) Right 07/27/2018   Procedure: Right hallux metatarsal phalangeal joint arthrodesis;  Surgeon: Adrian Arthurs, MD;  Location: [facility];  Service: Orthopedics;  Laterality: Right;   arthroscopic surgery for chondromalacia Left 1995   CATARACT EXTRACTION W/ INTRAOCULAR LENS  IMPLANT, BILATERAL  2017   COLONOSCOPY  05/26/2016   Dr.Stark   gamma knife surgery for trigeminal neuralgia  2005   inguinal herniorrhahpy  1991   L.   POLYPECTOMY  THUMB ARTHROSCOPY Right 07/07/2021   TONSILLECTOMY  1959   TRIGEMINAL NERVE DECOMPRESSION  2008   Left  Dr. Garnette [name]    Current Medications: Current Meds  Medication Sig   Amantadine HCl 100 MG tablet Take 100 mg by mouth 2 (two) times daily.   bisacodyl (DULCOLAX) 5 MG EC tablet Take 5 mg by mouth every other day.   carbidopa-levodopa (SINEMET IR) 25-100 MG tablet Take 1 tablet by mouth 4 (four) times daily. (Patient taking  differently: Take 2 tablets by mouth 4 (four) times daily.)   cetirizine (ZYRTEC) 10 MG tablet Take 10 mg by mouth daily.   entacapone (COMTAN) 200 MG tablet Take 200 mg by mouth 4 (four) times daily.   gabapentin (NEURONTIN) 100 MG capsule Take 2 capsules (200 mg total) by mouth at bedtime.   levothyroxine (SYNTHROID) 75 MCG tablet Take 1 tablet (75 mcg total) by mouth daily.   losartan (COZAAR) 100 MG tablet Take 1 tablet (100 mg total) by mouth daily. (Patient taking differently: Take 100 mg by mouth at bedtime.)   melatonin 5 MG TABS Take 5 mg by mouth at bedtime.   Misc Natural Products (TART CHERRY ADVANCED) CAPS Take 1 capsule by mouth daily.   Multiple Vitamins-Minerals (CENTRUM SILVER ADULT 50+ PO) Take 1 tablet by mouth at bedtime.   Plecanatide (TRULANCE) 3 MG TABS Take 3 mg by mouth daily. For constipation   Probiotic Product (PROBIOTIC DAILY PO) Take 1 tablet by mouth at bedtime.   rosuvastatin (CRESTOR) 10 MG tablet Take 1 tablet by mouth once daily   sertraline (ZOLOFT) 100 MG tablet Take 100 mg by mouth daily.   traZODone (DESYREL) 50 MG tablet Take 1 tablet (50 mg total) by mouth at bedtime.   TURMERIC PO Take 1 capsule by mouth daily.   VALERIAN ROOT PO Take 1,500 mg by mouth at bedtime.     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, broadcasting  Tobacco Use   Smoking status: Former    Types: Cigarettes    Quit date: 05/31/1992    Years since quitting: 30.3   Smokeless tobacco: Never  Vaping Use   Vaping Use: Never used   Substance and Sexual Activity   Alcohol use: Yes    Alcohol/week: 14.0 standard drinks of alcohol    Types: 14 Glasses of wine per week    Comment: social   Drug use: No   Sexual activity: Yes  Other Topics Concern   Not on file  Social History Narrative   [employer]   Married x2: 12 years 1st; 15 years ('26) 2nd   One daughter, step daugfhter, step son   Liliane Shi to bike   Social Determinants of Corporate investment banker Strain: Not on file  Food Insecurity: Not on file  Transportation Needs: Not on file  Physical Activity: Not on file  Stress: Not on file  Social Connections: Not on file     Family History: The patient's family history includes Breast cancer (age of onset: 65) in his sister; Diabetes in his mother; Heart disease (age of onset:  80) in his mother; Prostate cancer (age of onset: 67) in his father; Stroke in his brother. There is no history of Colon cancer, Esophageal cancer, Rectal cancer, or Stomach cancer.  ROS:   Please see the history of present illness.     All other systems reviewed and are negative.  EKGs/Labs/Other Studies Reviewed:    The following studies were reviewed today: CT calcium score 07/10/2019 Ascending Aorta: Normal size, measuring 42 mm at the mid ascending aorta, measured double oblique at PA bifurcation. No significant calcification. Pericardium: Normal Coronary arteries: Arise from normal coronary cusps.   IMPRESSION: Coronary calcium score of 631. This was 80th percentile for age and sex matched control.  Renal artery duplex ultrasound 07/24/2019 1. Chronically atrophied right kidney without evidence of hemodynamically significant renal artery stenosis or occlusion. 2. Compensatory hypertrophy of the left kidney suggests that the right-sided renal atrophy is a chronic abnormality. 3. Mild left-sided hydronephrosis of uncertain etiology. 4. Mild prostatomegaly. 5. No evidence of nephrolithiasis or renal mass.  MRA  06/18/2020 IMPRESSION: Mild aneurysmal disease of the ascending thoracic aorta measuring approximately 4.1 cm in maximum diameter. Recommend annual imaging followup by CTA or MRA  Stress test 06/18/2020 There was no ST segment deviation noted during stress. No T wave inversion was noted during stress.   1. Negative ECG stress test for ischemia but only achieved 84% MPHR. Cannot exclude ischemia at higher heart rate.  2. Average exercise capacity (6:20 min:s; 7.0 METS).  3. Normal HR/BP response to exercise.  4. 84% MPHR achieved as noted above.    EKG:  EKG is not ordered today.  ECG from 06/30/2022 is personally reviewed and shows shows sinus bradycardia with borderline voltage criteria for LVH. Recent Labs: 06/04/2022: ALT 6; TSH 3.14 06/30/2022: Platelets 144 09/13/2022: BUN 19; Creatinine, Ser 1.35; Hemoglobin 14.4; Potassium 4.6; Sodium 140  Recent Lipid Panel    Component Value Date/Time   CHOL 105 06/04/2022 1005   TRIG 91.0 06/04/2022 1005   HDL 53.00 06/04/2022 1005   CHOLHDL 2 06/04/2022 1005   VLDL 18.2 06/04/2022 1005   LDLCALC 34 06/04/2022 1005   LDLDIRECT 66.0 05/10/2016 0820    Physical Exam:    VS:  BP 120/70 (BP Location: Left Arm, Patient Position: Sitting, Cuff Size: Normal)   Pulse (!) 51   Ht 5\' 9"  (1.753 m)   Wt 189 lb 6.4 oz (85.9 kg)   SpO2 98%   BMI 27.97 kg/m     Wt Readings from Last 3 Encounters:  09/13/22 189 lb 6.4 oz (85.9 kg)  07/08/22 189 lb (85.7 kg)  06/30/22 198 lb (89.8 kg)     General: Alert, oriented x3, no distress Head: no evidence of trauma, PERRL, EOMI, no exophtalmos or lid lag, no myxedema, no xanthelasma; normal ears, nose and oropharynx Neck: normal jugular venous pulsations and no hepatojugular reflux; brisk carotid pulses without delay and no carotid bruits Chest: clear to auscultation, no signs of consolidation by percussion or palpation, normal fremitus, symmetrical and full respiratory excursions Cardiovascular:  normal position and quality of the apical impulse, regular rhythm, normal first and second heart sounds, no murmurs, rubs or gallops Abdomen: no tenderness or distention, no masses by palpation, no abnormal pulsatility or arterial bruits, normal bowel sounds, no hepatosplenomegaly Extremities: no clubbing, cyanosis or edema; 2+ radial, ulnar and brachial pulses bilaterally; 2+ right femoral, posterior tibial and dorsalis pedis pulses; 2+ left femoral, posterior tibial and dorsalis pedis pulses; no subclavian or femoral bruits Neurological:  grossly nonfocal Psych: Normal mood and affect   ASSESSMENT:    1. Coronary artery disease involving native coronary artery of native heart without angina pectoris   2. Aneurysm of ascending aorta without rupture   3. Essential hypertension   4. Stage 3a chronic kidney disease   5. Hyperlipidemia LDL goal <70     PLAN:    In order of problems listed above:  Coronary calcification: Calcium score is increased to a "CAD equivalent" range, but he is asymptomatic despite a very active lifestyle and had a normal treadmill stress test.  Continue to address risk factors. Asc Ao Aneur: Very mild dilation of the ascending aorta.  Time to recheck his MRI.  If this shows no change in aortic dimension recommend prolonging the interval between studies to 3 years. HTN: Very well-controlled. ARB and beta blockers preferred for the aneurysm, ARB also for CKD.  CKD 3: Stable creatinine around 1.4. Seems to have a GFR of around 45-50 since 2016. His nephrologist is Dr. Arrie Aran.  His urologist is Dr. Retta Diones HLP: All lipid parameters are in target range on current treatment.     Medication Adjustments/Labs and Tests Ordered: Current medicines are reviewed at length with the patient today.  Concerns regarding medicines are outlined above.  Orders Placed This Encounter  Procedures   MR CARDIAC MORPHOLOGY W WO CONTRAST   Basic metabolic panel   Hemoglobin   No  orders of the defined types were placed in this encounter.   Patient Instructions  Medication Instructions:  No changes *If you need a refill on your cardiac medications before your next appointment, please call your pharmacy*   Lab Work: Hg and Creatinine If you have labs (blood work) drawn today and your tests are completely normal, you will receive your results only by: MyChart Message (if you have MyChart) OR A paper copy in the mail If you have any lab test that is abnormal or we need to change your treatment, we will call you to review the results.   Testing/Procedures: Your physician has requested that you have a cardiac MRI. Cardiac MRI uses a computer to create images of your heart as its beating, producing both still and moving pictures of your heart and major blood vessels. For further information please visit [email or web address]. Please follow the instruction sheet given to you today for more information.   Follow-Up: At [facility], you and your health needs are our priority.  As part of our continuing mission to provide you with exceptional heart care, we have created designated Provider Care Teams.  These Care Teams include your primary Cardiologist (physician) and Advanced Practice Providers (APPs -  Physician Assistants and Nurse Practitioners) who all work together to provide you with the care you need, when you need it.  We recommend signing up for the patient portal called "MyChart".  Sign up information is provided on this After Visit Summary.  MyChart is used to connect with patients for Virtual Visits (Telemedicine).  Patients are able to view lab/test results, encounter notes, upcoming appointments, etc.  Non-urgent messages can be sent to your provider as well.   To learn more about what you can do with MyChart, go to [email or web address].    Your next appointment:   1 year(s)  Provider:   Thurmon Fair, MD       Signed, Adrian Fair, MD   09/19/2022 3:11 PM    [facility]

## 2022-09-13 NOTE — Patient Instructions (Signed)
Medication Instructions:  No changes *If you need a refill on your cardiac medications before your next appointment, please call your pharmacy*   Lab Work: Hg and Creatinine If you have labs (blood work) drawn today and your tests are completely normal, you will receive your results only by: MyChart Message (if you have MyChart) OR A paper copy in the mail If you have any lab test that is abnormal or we need to change your treatment, we will call you to review the results.   Testing/Procedures: Your physician has requested that you have a cardiac MRI. Cardiac MRI uses a computer to create images of your heart as its beating, producing both still and moving pictures of your heart and major blood vessels. For further information please visit [email or web address]. Please follow the instruction sheet given to you today for more information.   Follow-Up: At [facility], you and your health needs are our priority.  As part of our continuing mission to provide you with exceptional heart care, we have created designated Provider Care Teams.  These Care Teams include your primary Cardiologist (physician) and Advanced Practice Providers (APPs -  Physician Assistants and Nurse Practitioners) who all work together to provide you with the care you need, when you need it.  We recommend signing up for the patient portal called "MyChart".  Sign up information is provided on this After Visit Summary.  MyChart is used to connect with patients for Virtual Visits (Telemedicine).  Patients are able to view lab/test results, encounter notes, upcoming appointments, etc.  Non-urgent messages can be sent to your provider as well.   To learn more about what you can do with MyChart, go to [email or web address].    Your next appointment:   1 year(s)  Provider:   Thurmon Fair, MD

## 2022-09-14 LAB — BASIC METABOLIC PANEL
BUN/Creatinine Ratio: 14 (ref 10–24)
BUN: 19 mg/dL (ref 8–27)
CO2: 25 mmol/L (ref 20–29)
Calcium: 9.5 mg/dL (ref 8.6–10.2)
Chloride: 104 mmol/L (ref 96–106)
Creatinine, Ser: 1.35 mg/dL — ABNORMAL HIGH (ref 0.76–1.27)
Glucose: 94 mg/dL (ref 70–99)
Potassium: 4.6 mmol/L (ref 3.5–5.2)
Sodium: 140 mmol/L (ref 134–144)
eGFR: 56 mL/min/{1.73_m2} — ABNORMAL LOW (ref >59)

## 2022-09-14 LAB — HEMOGLOBIN: Hemoglobin: 14.4 g/dL (ref 13.0–17.7)

## 2022-09-19 ENCOUNTER — Encounter: Payer: Self-pay | Admitting: Cardiovascular Disease

## 2022-09-20 ENCOUNTER — Other Ambulatory Visit: Payer: Self-pay | Admitting: Internal Medicine

## 2022-09-28 DIAGNOSIS — R262 Difficulty in walking, not elsewhere classified: Secondary | ICD-10-CM | POA: Diagnosis not present

## 2022-09-28 DIAGNOSIS — G20A1 Parkinson's disease without dyskinesia, without mention of fluctuations: Secondary | ICD-10-CM | POA: Diagnosis not present

## 2022-09-29 ENCOUNTER — Other Ambulatory Visit: Payer: Self-pay | Admitting: Internal Medicine

## 2022-10-14 DIAGNOSIS — G20A1 Parkinson's disease without dyskinesia, without mention of fluctuations: Secondary | ICD-10-CM | POA: Diagnosis not present

## 2022-10-14 DIAGNOSIS — R262 Difficulty in walking, not elsewhere classified: Secondary | ICD-10-CM | POA: Diagnosis not present

## 2022-10-26 DIAGNOSIS — G20A1 Parkinson's disease without dyskinesia, without mention of fluctuations: Secondary | ICD-10-CM | POA: Diagnosis not present

## 2022-10-26 DIAGNOSIS — R262 Difficulty in walking, not elsewhere classified: Secondary | ICD-10-CM | POA: Diagnosis not present

## 2022-11-15 ENCOUNTER — Other Ambulatory Visit: Payer: Self-pay

## 2022-11-15 ENCOUNTER — Other Ambulatory Visit: Payer: Self-pay | Admitting: Emergency Medicine

## 2022-11-16 ENCOUNTER — Ambulatory Visit (HOSPITAL_COMMUNITY)
Admission: RE | Admit: 2022-11-16 | Discharge: 2022-11-16 | Disposition: A | Discharge status: Home / Self Care | Payer: Medicare Other | Source: Ambulatory Visit | Attending: Cardiovascular Disease | Admitting: Cardiovascular Disease

## 2022-11-16 DIAGNOSIS — I7121 Aneurysm of the ascending aorta, without rupture: Secondary | ICD-10-CM | POA: Diagnosis not present

## 2022-11-16 MED ORDER — GADOBUTROL 1 MMOL/ML IV SOLN
8.0000 mL | Freq: Once | INTRAVENOUS | Status: AC | PRN
  Administered 2022-11-16: 8 mL via INTRAVENOUS

## 2022-11-30 DIAGNOSIS — R262 Difficulty in walking, not elsewhere classified: Secondary | ICD-10-CM | POA: Diagnosis not present

## 2022-11-30 DIAGNOSIS — G20A1 Parkinson's disease without dyskinesia, without mention of fluctuations: Secondary | ICD-10-CM | POA: Diagnosis not present

## 2022-12-07 ENCOUNTER — Inpatient Hospital Stay (HOSPITAL_COMMUNITY): Admission: RE | Admit: 2022-12-07 | Payer: Medicare Other | Source: Ambulatory Visit

## 2022-12-07 DIAGNOSIS — R262 Difficulty in walking, not elsewhere classified: Secondary | ICD-10-CM | POA: Diagnosis not present

## 2022-12-07 DIAGNOSIS — G20A1 Parkinson's disease without dyskinesia, without mention of fluctuations: Secondary | ICD-10-CM | POA: Diagnosis not present

## 2022-12-08 ENCOUNTER — Encounter: Payer: Self-pay | Admitting: Internal Medicine

## 2022-12-08 ENCOUNTER — Ambulatory Visit (INDEPENDENT_AMBULATORY_CARE_PROVIDER_SITE_OTHER): Payer: Medicare Other | Admitting: Internal Medicine

## 2022-12-08 VITALS — BP 122/74 | HR 50 | Temp 97.9°F | Ht 69.0 in | Wt 192.0 lb

## 2022-12-08 DIAGNOSIS — E291 Testicular hypofunction: Secondary | ICD-10-CM

## 2022-12-08 DIAGNOSIS — N529 Male erectile dysfunction, unspecified: Secondary | ICD-10-CM

## 2022-12-08 DIAGNOSIS — N183 Chronic kidney disease, stage 3 unspecified: Secondary | ICD-10-CM | POA: Diagnosis not present

## 2022-12-08 DIAGNOSIS — R5383 Other fatigue: Secondary | ICD-10-CM | POA: Diagnosis not present

## 2022-12-08 DIAGNOSIS — E034 Atrophy of thyroid (acquired): Secondary | ICD-10-CM

## 2022-12-08 DIAGNOSIS — E538 Deficiency of other specified B group vitamins: Secondary | ICD-10-CM

## 2022-12-08 LAB — COMPREHENSIVE METABOLIC PANEL
ALT: 6 U/L (ref 0–53)
AST: 15 U/L (ref 0–37)
Albumin: 4.4 g/dL (ref 3.5–5.2)
Alkaline Phosphatase: 52 U/L (ref 39–117)
BUN: 19 mg/dL (ref 6–23)
CO2: 29 mEq/L (ref 19–32)
Calcium: 9.3 mg/dL (ref 8.4–10.5)
Chloride: 105 mEq/L (ref 96–112)
Creatinine, Ser: 1.32 mg/dL (ref 0.40–1.50)
GFR: 54.11 mL/min — ABNORMAL LOW (ref >60.00)
Glucose, Bld: 105 mg/dL — ABNORMAL HIGH (ref 70–99)
Potassium: 4.4 mEq/L (ref 3.5–5.1)
Sodium: 140 mEq/L (ref 135–145)
Total Bilirubin: 0.7 mg/dL (ref 0.2–1.2)
Total Protein: 6.7 g/dL (ref 6.0–8.3)

## 2022-12-08 LAB — TSH: TSH: 3.48 u[IU]/mL (ref 0.35–5.50)

## 2022-12-08 LAB — T4, FREE: Free T4: 0.78 ng/dL (ref 0.60–1.60)

## 2022-12-08 LAB — TESTOSTERONE: Testosterone: 231.94 ng/dL — ABNORMAL LOW (ref 300.00–890.00)

## 2022-12-08 MED ORDER — TRULANCE 3 MG PO TABS: 3.0000 mg | ORAL_TABLET | Freq: Every day | ORAL | 11 refills | Status: AC

## 2022-12-08 MED ORDER — LEVOTHYROXINE SODIUM 75 MCG PO TABS: 75.0000 ug | ORAL_TABLET | Freq: Every day | ORAL | 3 refills | Status: DC

## 2022-12-08 MED ORDER — LOSARTAN POTASSIUM 100 MG PO TABS: 100.0000 mg | ORAL_TABLET | Freq: Every day | ORAL | 3 refills | Status: DC

## 2022-12-08 MED ORDER — ROSUVASTATIN CALCIUM 10 MG PO TABS: 10.0000 mg | ORAL_TABLET | Freq: Every day | ORAL | 2 refills | Status: DC

## 2022-12-08 MED ORDER — HALOBETASOL PROPIONATE 0.05 % EX CREA: TOPICAL_CREAM | CUTANEOUS | 1 refills | Status: AC

## 2022-12-08 MED ORDER — SILDENAFIL CITRATE 100 MG PO TABS: ORAL_TABLET | ORAL | 5 refills | Status: DC

## 2022-12-08 MED ORDER — COLCHICINE 0.6 MG PO TABS: 0.6000 mg | ORAL_TABLET | Freq: Every day | ORAL | 2 refills | Status: AC | PRN

## 2022-12-08 MED ORDER — TADALAFIL 20 MG PO TABS: 10.0000 mg | ORAL_TABLET | ORAL | 5 refills | Status: DC | PRN

## 2022-12-08 MED ORDER — TRAZODONE HCL 50 MG PO TABS: 50.0000 mg | ORAL_TABLET | Freq: Every day | ORAL | 0 refills | Status: DC

## 2022-12-08 NOTE — Progress Notes (Signed)
Subjective:  Patient ID: Adrian [name], male    DOB: 1951-02-20  Age: 72 y.o. MRN: [id number]  CC: Follow-up (6 mnth f/u)   HPI Adrian [name] presents for DP, ED - worse, insomnia, low T  Outpatient Medications Prior to Visit  Medication Sig Dispense Refill   Amantadine HCl 100 MG tablet Take 100 mg by mouth 2 (two) times daily.     bisacodyl (DULCOLAX) 5 MG EC tablet Take 5 mg by mouth every other day.     carbidopa-levodopa (SINEMET IR) 25-100 MG tablet Take 1 tablet by mouth 4 (four) times daily. (Patient taking differently: Take 2 tablets by mouth 4 (four) times daily.) 360 tablet 3   cetirizine (ZYRTEC) 10 MG tablet Take 10 mg by mouth daily.     entacapone (COMTAN) 200 MG tablet Take 200 mg by mouth 4 (four) times daily.     melatonin 5 MG TABS Take 5 mg by mouth at bedtime.     Misc Natural Products (TART CHERRY ADVANCED) CAPS Take 1 capsule by mouth daily.     Multiple Vitamins-Minerals (CENTRUM SILVER ADULT 50+ PO) Take 1 tablet by mouth at bedtime.     Probiotic Product (PROBIOTIC DAILY PO) Take 1 tablet by mouth at bedtime.     sertraline (ZOLOFT) 100 MG tablet Take 100 mg by mouth daily.     TURMERIC PO Take 1 capsule by mouth daily.     VALERIAN ROOT PO Take 1,500 mg by mouth at bedtime.     levothyroxine (SYNTHROID) 75 MCG tablet Take 1 tablet (75 mcg total) by mouth daily. 90 tablet 3   losartan (COZAAR) 100 MG tablet Take 1 tablet (100 mg total) by mouth daily. (Patient taking differently: Take 100 mg by mouth at bedtime.) 90 tablet 3   Plecanatide (TRULANCE) 3 MG TABS Take 3 mg by mouth daily. For constipation 30 tablet 11   rosuvastatin (CRESTOR) 10 MG tablet Take 1 tablet by mouth once daily 90 tablet 2   traZODone (DESYREL) 50 MG tablet TAKE 1 TABLET BY MOUTH AT BEDTIME 90 tablet 0   triamcinolone cream (KENALOG) 0.1 % Apply 1 Application topically daily as needed (irritation). (Patient not taking: Reported on 09/13/2022)     colchicine 0.6 MG tablet Take 1 tablet  (0.6 mg total) by mouth daily as needed (gout or psuedogout pain). (Patient not taking: Reported on 09/13/2022) 30 tablet 2   gabapentin (NEURONTIN) 100 MG capsule Take 2 capsules (200 mg total) by mouth at bedtime. 180 capsule 3   halobetasol (ULTRAVATE) 0.05 % cream APPLY CREAM TOPICALLY 2 TIMES DAILY AS NEEDED (Patient not taking: Reported on 09/13/2022) 45 g 1   sildenafil (VIAGRA) 100 MG tablet TAKE 1/2 TO 1 (ONE-HALF TO ONE) TABLET BY MOUTH ONCE DAILY AS NEEDED FOR ERECTILE DYSFUNCTION (Patient not taking: Reported on 09/13/2022) 12 tablet 5   No facility-administered medications prior to visit.    ROS: Review of Systems  Constitutional:  Negative for appetite change, fatigue and unexpected weight change.  HENT:  Negative for congestion, nosebleeds, sneezing, sore throat and trouble swallowing.   Eyes:  Negative for itching and visual disturbance.  Respiratory:  Negative for cough.   Cardiovascular:  Negative for chest pain, palpitations and leg swelling.  Gastrointestinal:  Negative for abdominal distention, blood in stool, diarrhea and nausea.  Genitourinary:  Negative for frequency and hematuria.  Musculoskeletal:  Negative for back pain, gait problem, joint swelling and neck pain.  Skin:  Negative for rash.  Neurological:  Positive for tremors. Negative for dizziness, speech difficulty and weakness.  Psychiatric/Behavioral:  Positive for sleep disturbance. Negative for agitation, dysphoric mood and suicidal ideas. The patient is not nervous/anxious.     Objective:  BP 122/74 (BP Location: Left Arm, Patient Position: Sitting, Cuff Size: Large)   Pulse (!) 50   Temp 97.9 F (36.6 C) (Oral)   Ht 5\' 9"  (1.753 m)   Wt 192 lb (87.1 kg)   SpO2 96%   BMI 28.35 kg/m   BP Readings from Last 3 Encounters:  12/08/22 122/74  09/13/22 120/70  07/08/22 (!) 163/81    Wt Readings from Last 3 Encounters:  12/08/22 192 lb (87.1 kg)  09/13/22 189 lb 6.4 oz (85.9 kg)  07/08/22 189 lb  (85.7 kg)    Physical Exam Constitutional:      General: He is not in acute distress.    Appearance: He is well-developed.     Comments: NAD  Eyes:     Conjunctiva/sclera: Conjunctivae normal.     Pupils: Pupils are equal, round, and reactive to light.  Neck:     Thyroid: No thyromegaly.     Vascular: No JVD.  Cardiovascular:     Rate and Rhythm: Normal rate and regular rhythm.     Heart sounds: Normal heart sounds. No murmur heard.    No friction rub. No gallop.  Pulmonary:     Effort: Pulmonary effort is normal. No respiratory distress.     Breath sounds: Normal breath sounds. No wheezing or rales.  Chest:     Chest wall: No tenderness.  Abdominal:     General: Bowel sounds are normal. There is no distension.     Palpations: Abdomen is soft. There is no mass.     Tenderness: There is no abdominal tenderness. There is no guarding or rebound.  Musculoskeletal:        General: No tenderness. Normal range of motion.     Cervical back: Normal range of motion.  Lymphadenopathy:     Cervical: No cervical adenopathy.  Skin:    General: Skin is warm and dry.     Findings: No rash.  Neurological:     Mental Status: He is alert and oriented to person, place, and time.     Cranial Nerves: No cranial nerve deficit.     Motor: No abnormal muscle tone.     Coordination: Coordination normal.     Gait: Gait normal.     Deep Tendon Reflexes: Reflexes are normal and symmetric.  Psychiatric:        Behavior: Behavior normal.        Thought Content: Thought content normal.        Judgment: Judgment normal.   Mild tremor  Lab Results  Component Value Date   WBC 4.7 06/30/2022   HGB 14.4 09/13/2022   HCT 43.7 06/30/2022   PLT 144 (L) 06/30/2022   GLUCOSE 94 09/13/2022   CHOL 105 06/04/2022   TRIG 91.0 06/04/2022   HDL 53.00 06/04/2022   LDLDIRECT 66.0 05/10/2016   LDLCALC 34 06/04/2022   ALT 6 06/04/2022   AST 16 06/04/2022   NA 140 09/13/2022   K 4.6 09/13/2022   CL 104  09/13/2022   CREATININE 1.35 (H) 09/13/2022   BUN 19 09/13/2022   CO2 25 09/13/2022   TSH 3.14 06/04/2022   PSA 1.05 06/04/2022   INR 1.0 RATIO 09/18/2007   HGBA1C 5.6 06/20/2019    MR Angiogram Chest W  Wo Contrast  Result Date: 11/17/2022 CLINICAL DATA:  aortic aneurysm EXAM: MRA CHEST WITH OR WITHOUT CONTRAST TECHNIQUE: Angiographic images of the chest were obtained using MRA technique without and with intravenous contrast. CONTRAST:  8mL GADAVIST GADOBUTROL 1 MMOL/ML IV SOLN COMPARISON:  March 2023 FINDINGS: Cardiovascular: Preferential opacification of the thoracic aorta. Ascending thoracic aortic aneurysm measures 4.2 cm, previously estimated 4.3 cm. Normal heart size. No pericardial effusion. Mediastinum/Nodes: No enlarged mediastinal, hilar, or axillary lymph nodes. Lungs/Pleura: No pleural effusion. No pneumothorax. No mass or lobar consolidation. Musculoskeletal: No aggressive osseous lesions. Upper abdomen: Similar appearance of nonenhancing simple appearing hepatic cyst in segment 4B of the liver, which requires no further imaging workup. The visualized upper abdomen is otherwise unremarkable. IMPRESSION: VASCULAR 1. Similar appearance of ascending thoracic aortic aneurysm measuring 4.2 cm on today's exam. Recommend annual imaging followup by CTA or MRA. This recommendation follows 2010 ACCF/AHA/AATS/ACR/ASA/SCA/SCAI/SIR/STS/SVM Guidelines for the Diagnosis and Management of Patients with Thoracic Aortic Disease. Circulation. 2010; 121: X096-E454. Aortic aneurysm NOS (ICD10-I71.9) NON-VASCULAR 1. No acute extravascular findings in the chest. Electronically Signed   By: Olive Bass M.D.   On: 11/17/2022 09:45    Assessment & Plan:   Problem List Items Addressed This Visit     Hypothyroidism - Primary    On Levothyroxine Check TSH and FT4      Relevant Medications   levothyroxine (SYNTHROID) 75 MCG tablet   Other Relevant Orders   Comprehensive metabolic panel   T4, free    TSH   B12 deficiency    On Vit B12      Erectile dysfunction    D/c Viagra - not effective Start Cialis      Relevant Orders   Testosterone   CRF (chronic renal failure), stage 3 (moderate)    Monitor GFR      Hypogonadism male    Check labs      Other Visit Diagnoses     Other fatigue       Relevant Orders   Testosterone         Meds ordered this encounter  Medications   colchicine 0.6 MG tablet    Sig: Take 1 tablet (0.6 mg total) by mouth daily as needed (gout or psuedogout pain).    Dispense:  30 tablet    Refill:  2   halobetasol (ULTRAVATE) 0.05 % cream    Sig: Apply topically 2 times daily as needed    Dispense:  45 g    Refill:  1   levothyroxine (SYNTHROID) 75 MCG tablet    Sig: Take 1 tablet (75 mcg total) by mouth daily.    Dispense:  90 tablet    Refill:  3   losartan (COZAAR) 100 MG tablet    Sig: Take 1 tablet (100 mg total) by mouth daily.    Dispense:  90 tablet    Refill:  3   Plecanatide (TRULANCE) 3 MG TABS    Sig: Take 1 tablet (3 mg total) by mouth daily. For constipation    Dispense:  30 tablet    Refill:  11   rosuvastatin (CRESTOR) 10 MG tablet    Sig: Take 1 tablet (10 mg total) by mouth daily.    Dispense:  90 tablet    Refill:  2   DISCONTD: sildenafil (VIAGRA) 100 MG tablet    Sig: TAKE 1/2 TO 1 (ONE-HALF TO ONE) TABLET BY MOUTH ONCE DAILY AS NEEDED FOR ERECTILE DYSFUNCTION    Dispense:  12  tablet    Refill:  5   traZODone (DESYREL) 50 MG tablet    Sig: Take 1 tablet (50 mg total) by mouth at bedtime.    Dispense:  90 tablet    Refill:  0   tadalafil (CIALIS) 20 MG tablet    Sig: Take 0.5-1 tablets (10-20 mg total) by mouth every other day as needed for erectile dysfunction.    Dispense:  10 tablet    Refill:  5      Follow-up: Return in about 3 months (around 03/10/2023) for a follow-up visit.  Sonda Primes, MD

## 2022-12-08 NOTE — Assessment & Plan Note (Signed)
Monitor GFR 

## 2022-12-08 NOTE — Assessment & Plan Note (Signed)
On Levothyroxine Check TSH and FT4 

## 2022-12-08 NOTE — Assessment & Plan Note (Signed)
D/c Viagra - not effective Start Cialis

## 2022-12-08 NOTE — Assessment & Plan Note (Signed)
On Vit B12 

## 2022-12-08 NOTE — Assessment & Plan Note (Signed)
Check labs 

## 2022-12-11 ENCOUNTER — Encounter: Payer: Self-pay | Admitting: Internal Medicine

## 2022-12-20 ENCOUNTER — Other Ambulatory Visit: Payer: Self-pay | Admitting: Internal Medicine

## 2022-12-20 MED ORDER — TESTOSTERONE CYPIONATE 200 MG/ML IM SOLN: 200.0000 mg | INTRAMUSCULAR | 5 refills | Status: DC

## 2022-12-20 MED ORDER — "BD ECLIPSE SYRINGE 25G X 1"" 3 ML MISC": 3 refills | Status: AC

## 2022-12-24 ENCOUNTER — Ambulatory Visit (INDEPENDENT_AMBULATORY_CARE_PROVIDER_SITE_OTHER): Payer: Medicare Other

## 2022-12-24 DIAGNOSIS — E291 Testicular hypofunction: Secondary | ICD-10-CM | POA: Diagnosis not present

## 2022-12-24 DIAGNOSIS — Z789 Other specified health status: Secondary | ICD-10-CM | POA: Diagnosis not present

## 2022-12-24 MED ORDER — TESTOSTERONE CYPIONATE 200 MG/ML IM SOLN: 200.0000 mg | INTRAMUSCULAR | Status: DC

## 2022-12-24 NOTE — Progress Notes (Cosign Needed Addendum)
Patient presents in office today to learn how to self administer Testerone injections at home. Patient provided medication as well as syringes provided by pharmacy.  Patient watched and was comfortable with drawing up medication.  Patient coached on how to properly clean skin (I provided alcohol pad and he properly cleaned area)  Patient coached on how to locate proper side on thigh area and coached on how angle shot before injecting.  Patient then injected shot successfully himself and was comfortable giving at home. Patient also informed on how to dispose and properly cap needles.  All questions answered and patient coached appropriately.   Medical screening examination/treatment/procedure(s) were performed by non-physician practitioner and as supervising physician I was immediately available for consultation/collaboration.  I agree with above. Jacinta Shoe, MD

## 2023-01-25 DIAGNOSIS — N1330 Unspecified hydronephrosis: Secondary | ICD-10-CM | POA: Diagnosis not present

## 2023-01-25 DIAGNOSIS — N261 Atrophy of kidney (terminal): Secondary | ICD-10-CM | POA: Diagnosis not present

## 2023-01-25 DIAGNOSIS — N1831 Chronic kidney disease, stage 3a: Secondary | ICD-10-CM | POA: Diagnosis not present

## 2023-01-25 DIAGNOSIS — I129 Hypertensive chronic kidney disease with stage 1 through stage 4 chronic kidney disease, or unspecified chronic kidney disease: Secondary | ICD-10-CM | POA: Diagnosis not present

## 2023-01-25 DIAGNOSIS — N200 Calculus of kidney: Secondary | ICD-10-CM | POA: Diagnosis not present

## 2023-01-25 DIAGNOSIS — G20A1 Parkinson's disease without dyskinesia, without mention of fluctuations: Secondary | ICD-10-CM | POA: Diagnosis not present

## 2023-01-25 DIAGNOSIS — E785 Hyperlipidemia, unspecified: Secondary | ICD-10-CM | POA: Diagnosis not present

## 2023-01-25 DIAGNOSIS — N390 Urinary tract infection, site not specified: Secondary | ICD-10-CM | POA: Diagnosis not present

## 2023-01-27 LAB — LAB REPORT - SCANNED
Albumin, Urine POC: 7.9
Albumin/Creatinine Ratio, Urine, POC: 5
Creatinine, POC: 149 mg/dL

## 2023-03-10 ENCOUNTER — Institutional Professional Consult (permissible substitution) (INDEPENDENT_AMBULATORY_CARE_PROVIDER_SITE_OTHER): Payer: Medicare Other | Admitting: Audiology

## 2023-03-10 ENCOUNTER — Ambulatory Visit (INDEPENDENT_AMBULATORY_CARE_PROVIDER_SITE_OTHER): Payer: Medicare Other | Admitting: Otolaryngology

## 2023-03-10 ENCOUNTER — Encounter (INDEPENDENT_AMBULATORY_CARE_PROVIDER_SITE_OTHER): Payer: Self-pay

## 2023-03-10 ENCOUNTER — Ambulatory Visit: Payer: Medicare Other | Admitting: Internal Medicine

## 2023-03-10 ENCOUNTER — Ambulatory Visit (INDEPENDENT_AMBULATORY_CARE_PROVIDER_SITE_OTHER): Payer: Medicare Other | Admitting: Audiology

## 2023-03-10 ENCOUNTER — Encounter: Payer: Self-pay | Admitting: Internal Medicine

## 2023-03-10 VITALS — Ht 69.0 in | Wt 188.0 lb

## 2023-03-10 VITALS — BP 120/70 | HR 56 | Temp 98.9°F | Ht 69.0 in | Wt 194.0 lb

## 2023-03-10 DIAGNOSIS — H903 Sensorineural hearing loss, bilateral: Secondary | ICD-10-CM | POA: Diagnosis not present

## 2023-03-10 DIAGNOSIS — I10 Essential (primary) hypertension: Secondary | ICD-10-CM | POA: Diagnosis not present

## 2023-03-10 DIAGNOSIS — N183 Chronic kidney disease, stage 3 unspecified: Secondary | ICD-10-CM | POA: Diagnosis not present

## 2023-03-10 DIAGNOSIS — E538 Deficiency of other specified B group vitamins: Secondary | ICD-10-CM

## 2023-03-10 DIAGNOSIS — H9313 Tinnitus, bilateral: Secondary | ICD-10-CM | POA: Diagnosis not present

## 2023-03-10 DIAGNOSIS — E291 Testicular hypofunction: Secondary | ICD-10-CM | POA: Diagnosis not present

## 2023-03-10 DIAGNOSIS — G20B1 Parkinson's disease with dyskinesia, without mention of fluctuations: Secondary | ICD-10-CM | POA: Diagnosis not present

## 2023-03-10 DIAGNOSIS — N529 Male erectile dysfunction, unspecified: Secondary | ICD-10-CM

## 2023-03-10 MED ORDER — SILDENAFIL CITRATE 100 MG PO TABS: 50.0000 mg | ORAL_TABLET | Freq: Every day | ORAL | 11 refills | Status: DC | PRN

## 2023-03-10 NOTE — Progress Notes (Signed)
[facility] [address], [address] [place], [place] [address]   Audiological Evaluation    History: Adrian [name] was referred today for a hearing evaluation by Dr. Janeece Riggers Philomena Doheny. He was previously seen at [facility] for an audiological evaluation on March 24th, 2022.     Tympanogram: Right ear: Normal external ear canal volume with normal middle ear pressure and tympanic membrane compliance (Type A). Left ear: Normal external ear canal volume with normal middle ear pressure and tympanic membrane compliance (Type A).   Hearing Evaluation: The audiogram was completed using conventional audiometric techniques under headphones with good reliability.   The hearing test results indicate: Right ear: Normal hearing sensitivity from [phone] Hz sloping to moderate sensorineural hearing loss from 3000-8000 Hz. Left ear: Normal hearing sensitivity from [phone] Hz sloping to moderately-severe sensorineural hearing loss from 3000-8000 Hz.   Speech Recognition Thresholds were obtained at 15 dBHL in the right ear and 15 dBHL in the left ear.   Word Recognition Testing was completed using the NU-6 word lists at 55 dBHL in the right ear and at 55 dBHL in the left ear and the patient scored 96% in the right ear and 96% in the left ear.   Recommendations: Repeat audiogram when changes are perceived or per MD.    Conley Rolls Onesimo Lingard, AUD, CCC-A 03/10/23

## 2023-03-10 NOTE — Assessment & Plan Note (Signed)
On Testosterone inj - doing well  Potential benefits of a long term testosterone use as well as potential risks (BPH, MI, OSA, cancer)  and complications were explained to the patient and were aknowledged. Check labs

## 2023-03-10 NOTE — Assessment & Plan Note (Signed)
In PT 

## 2023-03-10 NOTE — Assessment & Plan Note (Signed)
Dr Rayvon Char q 6 mo

## 2023-03-10 NOTE — Assessment & Plan Note (Signed)
Re-start Viagra - effective w/testosterone D/c Cialis

## 2023-03-10 NOTE — Assessment & Plan Note (Signed)
On Vit B12 

## 2023-03-10 NOTE — Progress Notes (Signed)
Subjective:  Patient ID: Adrian [name], male    DOB: [birth year]  Age: 72 y.o. MRN: [id number]  CC: Follow-up (3 mnth f/u)   HPI Adrian [name] presents for hypogonadism - feeling better F/u Parkinson's, ED  Outpatient Medications Prior to Visit  Medication Sig Dispense Refill   Amantadine HCl 100 MG tablet Take 100 mg by mouth 2 (two) times daily.     bisacodyl (DULCOLAX) 5 MG EC tablet Take 5 mg by mouth every other day.     carbidopa-levodopa (SINEMET IR) 25-100 MG tablet Take 1 tablet by mouth 4 (four) times daily. (Patient taking differently: Take 2 tablets by mouth 4 (four) times daily.) 360 tablet 3   cetirizine (ZYRTEC) 10 MG tablet Take 10 mg by mouth daily.     colchicine 0.6 MG tablet Take 1 tablet (0.6 mg total) by mouth daily as needed (gout or psuedogout pain). 30 tablet 2   entacapone (COMTAN) 200 MG tablet Take 200 mg by mouth 4 (four) times daily.     halobetasol (ULTRAVATE) 0.05 % cream Apply topically 2 times daily as needed 45 g 1   levothyroxine (SYNTHROID) 75 MCG tablet Take 1 tablet (75 mcg total) by mouth daily. 90 tablet 3   losartan (COZAAR) 100 MG tablet Take 1 tablet (100 mg total) by mouth daily. 90 tablet 3   melatonin 5 MG TABS Take 5 mg by mouth at bedtime.     Misc Natural Products (TART CHERRY ADVANCED) CAPS Take 1 capsule by mouth daily.     Multiple Vitamins-Minerals (CENTRUM SILVER ADULT 50+ PO) Take 1 tablet by mouth at bedtime.     Plecanatide (TRULANCE) 3 MG TABS Take 1 tablet (3 mg total) by mouth daily. For constipation 30 tablet 11   Probiotic Product (PROBIOTIC DAILY PO) Take 1 tablet by mouth at bedtime.     rosuvastatin (CRESTOR) 10 MG tablet Take 1 tablet (10 mg total) by mouth daily. 90 tablet 2   sertraline (ZOLOFT) 100 MG tablet Take 100 mg by mouth daily.     SYRINGE-NEEDLE, DISP, 3 ML (BD ECLIPSE SYRINGE) 25G X 1" 3 ML MISC As directed 50 each 3   testosterone cypionate (DEPOTESTOSTERONE CYPIONATE) 200 MG/ML injection Inject 1 mL  (200 mg total) into the muscle every 14 (fourteen) days. 10 mL 5   traZODone (DESYREL) 50 MG tablet Take 1 tablet (50 mg total) by mouth at bedtime. 90 tablet 0   triamcinolone cream (KENALOG) 0.1 % Apply 1 Application topically daily as needed (irritation).     TURMERIC PO Take 1 capsule by mouth daily.     VALERIAN ROOT PO Take 1,500 mg by mouth at bedtime.     tadalafil (CIALIS) 20 MG tablet Take 0.5-1 tablets (10-20 mg total) by mouth every other day as needed for erectile dysfunction. 10 tablet 5   No facility-administered medications prior to visit.    ROS: Review of Systems  Constitutional:  Negative for appetite change, fatigue and unexpected weight change.  HENT:  Negative for congestion, nosebleeds, sneezing, sore throat and trouble swallowing.   Eyes:  Negative for itching and visual disturbance.  Respiratory:  Negative for cough.   Cardiovascular:  Negative for chest pain, palpitations and leg swelling.  Gastrointestinal:  Negative for abdominal distention, blood in stool, diarrhea and nausea.  Genitourinary:  Negative for frequency and hematuria.  Musculoskeletal:  Negative for back pain, gait problem, joint swelling and neck pain.  Skin:  Negative for rash.  Neurological:  Positive for tremors. Negative for dizziness, speech difficulty and weakness.  Psychiatric/Behavioral:  Negative for agitation, dysphoric mood, sleep disturbance and suicidal ideas. The patient is not nervous/anxious.     Objective:  BP 120/70 (BP Location: Left Arm, Patient Position: Sitting, Cuff Size: Normal)   Pulse (!) 56   Temp 98.9 F (37.2 C) (Oral)   Ht 5\' 9"  (1.753 m)   Wt 194 lb (88 kg)   SpO2 94%   BMI 28.65 kg/m   BP Readings from Last 3 Encounters:  03/10/23 120/70  12/08/22 122/74  09/13/22 120/70    Wt Readings from Last 3 Encounters:  03/10/23 194 lb (88 kg)  12/08/22 192 lb (87.1 kg)  09/13/22 189 lb 6.4 oz (85.9 kg)    Physical Exam Constitutional:      General: He  is not in acute distress.    Appearance: He is well-developed.     Comments: NAD  Eyes:     Conjunctiva/sclera: Conjunctivae normal.     Pupils: Pupils are equal, round, and reactive to light.  Neck:     Thyroid: No thyromegaly.     Vascular: No JVD.  Cardiovascular:     Rate and Rhythm: Normal rate and regular rhythm.     Heart sounds: Normal heart sounds. No murmur heard.    No friction rub. No gallop.  Pulmonary:     Effort: Pulmonary effort is normal. No respiratory distress.     Breath sounds: Normal breath sounds. No wheezing or rales.  Chest:     Chest wall: No tenderness.  Abdominal:     General: Bowel sounds are normal. There is no distension.     Palpations: Abdomen is soft. There is no mass.     Tenderness: There is no abdominal tenderness. There is no guarding or rebound.  Musculoskeletal:        General: No tenderness. Normal range of motion.     Cervical back: Normal range of motion.  Lymphadenopathy:     Cervical: No cervical adenopathy.  Skin:    General: Skin is warm and dry.     Findings: No rash.  Neurological:     Mental Status: He is alert and oriented to person, place, and time.     Cranial Nerves: No cranial nerve deficit.     Motor: No abnormal muscle tone.     Coordination: Coordination normal.     Gait: Gait normal.     Deep Tendon Reflexes: Reflexes are normal and symmetric.  Psychiatric:        Behavior: Behavior normal.        Thought Content: Thought content normal.        Judgment: Judgment normal.     Lab Results  Component Value Date   WBC 4.7 06/30/2022   HGB 14.4 09/13/2022   HCT 43.7 06/30/2022   PLT 144 (L) 06/30/2022   GLUCOSE 105 (H) 12/08/2022   CHOL 105 06/04/2022   TRIG 91.0 06/04/2022   HDL 53.00 06/04/2022   LDLDIRECT 66.0 05/10/2016   LDLCALC 34 06/04/2022   ALT 6 12/08/2022   AST 15 12/08/2022   NA 140 12/08/2022   K 4.4 12/08/2022   CL 105 12/08/2022   CREATININE 1.32 12/08/2022   BUN 19 12/08/2022   CO2 29  12/08/2022   TSH 3.48 12/08/2022   PSA 1.05 06/04/2022   INR 1.0 RATIO 09/18/2007   HGBA1C 5.6 06/20/2019    MR Angiogram Chest W Wo Contrast  Result Date: 11/17/2022  CLINICAL DATA:  aortic aneurysm EXAM: MRA CHEST WITH OR WITHOUT CONTRAST TECHNIQUE: Angiographic images of the chest were obtained using MRA technique without and with intravenous contrast. CONTRAST:  8mL GADAVIST GADOBUTROL 1 MMOL/ML IV SOLN COMPARISON:  March 2023 FINDINGS: Cardiovascular: Preferential opacification of the thoracic aorta. Ascending thoracic aortic aneurysm measures 4.2 cm, previously estimated 4.3 cm. Normal heart size. No pericardial effusion. Mediastinum/Nodes: No enlarged mediastinal, hilar, or axillary lymph nodes. Lungs/Pleura: No pleural effusion. No pneumothorax. No mass or lobar consolidation. Musculoskeletal: No aggressive osseous lesions. Upper abdomen: Similar appearance of nonenhancing simple appearing hepatic cyst in segment 4B of the liver, which requires no further imaging workup. The visualized upper abdomen is otherwise unremarkable. IMPRESSION: VASCULAR 1. Similar appearance of ascending thoracic aortic aneurysm measuring 4.2 cm on today's exam. Recommend annual imaging followup by CTA or MRA. This recommendation follows 2010 ACCF/AHA/AATS/ACR/ASA/SCA/SCAI/SIR/STS/SVM Guidelines for the Diagnosis and Management of Patients with Thoracic Aortic Disease. Circulation. 2010; 121: H846-N629. Aortic aneurysm NOS (ICD10-I71.9) NON-VASCULAR 1. No acute extravascular findings in the chest. Electronically Signed   By: Olive Bass M.D.   On: 11/17/2022 09:45    Assessment & Plan:   Problem List Items Addressed This Visit     B12 deficiency    On Vit B12      Essential hypertension    Cont on Losartan      Relevant Medications   sildenafil (VIAGRA) 100 MG tablet   Erectile dysfunction    Re-start Viagra - effective w/testosterone D/c Cialis      CRF (chronic renal failure), stage 3 (moderate)     Dr Rayvon Char q 6 mo      Relevant Orders   Comprehensive metabolic panel   CBC with Differential/Platelet   Hypogonadism male - Primary    On Testosterone inj - doing well  Potential benefits of a long term testosterone use as well as potential risks (BPH, MI, OSA, cancer)  and complications were explained to the patient and were aknowledged. Check labs      Relevant Orders   Comprehensive metabolic panel   CBC with Differential/Platelet   Testosterone   Parkinson disease (HCC)    In PT      Relevant Orders   Comprehensive metabolic panel   CBC with Differential/Platelet      Meds ordered this encounter  Medications   sildenafil (VIAGRA) 100 MG tablet    Sig: Take 0.5-1 tablets (50-100 mg total) by mouth daily as needed for erectile dysfunction.    Dispense:  12 tablet    Refill:  11      Follow-up: Return in about 3 months (around 06/10/2023) for a follow-up visit.  Sonda Primes, MD

## 2023-03-10 NOTE — Assessment & Plan Note (Signed)
Cont on Losartan °

## 2023-03-12 DIAGNOSIS — H9313 Tinnitus, bilateral: Secondary | ICD-10-CM | POA: Insufficient documentation

## 2023-03-12 DIAGNOSIS — H903 Sensorineural hearing loss, bilateral: Secondary | ICD-10-CM | POA: Insufficient documentation

## 2023-03-12 NOTE — Progress Notes (Signed)
Patient ID: Adrian [name], male   DOB: 1951/03/03, 72 y.o.   MRN: [id number]  Follow-up: Hearing loss, tinnitus  HPI: The patient is a 72 year old male who presents today complaining of progressive hearing loss and persistent bilateral tinnitus.  The patient was last seen in 2022.  At that time, he was noted to have bilateral high-frequency sensorineural hearing loss.  The hearing amplification options were discussed.  The patient returns today complaining of progressive hearing difficulty.  He is having difficulty hearing his friends, especially in noisy environments.  He also complains of bilateral high-pitched tinnitus.  He denies any otalgia, otorrhea, or vertigo.  Exam: General: Communicates without difficulty, well nourished, no acute distress. Head: Normocephalic, no evidence injury, no tenderness, facial buttresses intact without stepoff. Face/sinus: No tenderness to palpation and percussion. Facial movement is normal and symmetric. Eyes: PERRL, EOMI. No scleral icterus, conjunctivae clear. Neuro: CN II exam reveals vision grossly intact.  No nystagmus at any point of gaze. Ears: Auricles well formed without lesions.  Ear canals are intact without mass or lesion.  No erythema or edema is appreciated.  The TMs are intact without fluid. Nose: External evaluation reveals normal support and skin without lesions.  Dorsum is intact.  Anterior rhinoscopy reveals congested mucosa over anterior aspect of inferior turbinates and intact septum.  No purulence noted. Oral:  Oral cavity and oropharynx are intact, symmetric, without erythema or edema.  Mucosa is moist without lesions. Neck: Full range of motion without pain.  There is no significant lymphadenopathy.  No masses palpable.  Thyroid bed within normal limits to palpation.  Parotid glands and submandibular glands equal bilaterally without mass.  Trachea is midline. Neuro:  CN 2-12 grossly intact.    Audiometric evaluation shows bilateral symmetric  high-frequency sensorineural hearing loss.  Assessment: 1.  Bilateral symmetric high-frequency sensorineural hearing loss, likely secondary to presbycusis. 2.  The patient's tinnitus is likely a result of his hearing loss. 3.  His ear canals, tympanic membranes, and middle ear spaces are normal.  Plan: 1.  The physical exam findings and the hearing test results are reviewed with the patient. 2.  The patient is a candidate for hearing amplification.  The hearing aid options are discussed. 3.  The strategies to cope with tinnitus are discussed. 4.  The patient will return for reevaluation in 1 year.

## 2023-03-13 ENCOUNTER — Other Ambulatory Visit: Payer: Self-pay | Admitting: Internal Medicine

## 2023-03-14 ENCOUNTER — Other Ambulatory Visit (INDEPENDENT_AMBULATORY_CARE_PROVIDER_SITE_OTHER): Payer: Medicare Other

## 2023-03-14 DIAGNOSIS — E291 Testicular hypofunction: Secondary | ICD-10-CM | POA: Diagnosis not present

## 2023-03-14 DIAGNOSIS — G20B1 Parkinson's disease with dyskinesia, without mention of fluctuations: Secondary | ICD-10-CM | POA: Diagnosis not present

## 2023-03-14 DIAGNOSIS — N183 Chronic kidney disease, stage 3 unspecified: Secondary | ICD-10-CM

## 2023-03-14 LAB — CBC WITH DIFFERENTIAL/PLATELET
Basophils Absolute: 0 10*3/uL (ref 0.0–0.1)
Basophils Relative: 0.4 % (ref 0.0–3.0)
Eosinophils Absolute: 0.1 10*3/uL (ref 0.0–0.7)
Eosinophils Relative: 2.2 % (ref 0.0–5.0)
HCT: 45.4 % (ref 39.0–52.0)
Hemoglobin: 14.9 g/dL (ref 13.0–17.0)
Lymphocytes Relative: 30.9 % (ref 12.0–46.0)
Lymphs Abs: 1.6 10*3/uL (ref 0.7–4.0)
MCHC: 32.8 g/dL (ref 30.0–36.0)
MCV: 94.3 fL (ref 78.0–100.0)
Monocytes Absolute: 0.4 10*3/uL (ref 0.1–1.0)
Monocytes Relative: 8.4 % (ref 3.0–12.0)
Neutro Abs: 2.9 10*3/uL (ref 1.4–7.7)
Neutrophils Relative %: 58.1 % (ref 43.0–77.0)
Platelets: 151 10*3/uL (ref 150.0–400.0)
RBC: 4.82 Mil/uL (ref 4.22–5.81)
RDW: 13.6 % (ref 11.5–15.5)
WBC: 5.1 10*3/uL (ref 4.0–10.5)

## 2023-03-14 LAB — COMPREHENSIVE METABOLIC PANEL
ALT: 6 U/L (ref 0–53)
AST: 17 U/L (ref 0–37)
Albumin: 4.2 g/dL (ref 3.5–5.2)
Alkaline Phosphatase: 43 U/L (ref 39–117)
BUN: 19 mg/dL (ref 6–23)
CO2: 26 meq/L (ref 19–32)
Calcium: 9.5 mg/dL (ref 8.4–10.5)
Chloride: 105 meq/L (ref 96–112)
Creatinine, Ser: 1.32 mg/dL (ref 0.40–1.50)
GFR: 54.01 mL/min — ABNORMAL LOW (ref >60.00)
Glucose, Bld: 100 mg/dL — ABNORMAL HIGH (ref 70–99)
Potassium: 4.2 meq/L (ref 3.5–5.1)
Sodium: 137 meq/L (ref 135–145)
Total Bilirubin: 0.6 mg/dL (ref 0.2–1.2)
Total Protein: 6.7 g/dL (ref 6.0–8.3)

## 2023-03-14 LAB — TESTOSTERONE: Testosterone: 764.24 ng/dL (ref 300.00–890.00)

## 2023-03-15 IMAGING — DX DG ANKLE COMPLETE 3+V*L*
3 series · 3 of 3 positions shown · non-contrast
Comparison: None.

CLINICAL DATA: Left ankle pain.

EXAM:
LEFT ANKLE COMPLETE - 3+ VIEW

[ankle ap]
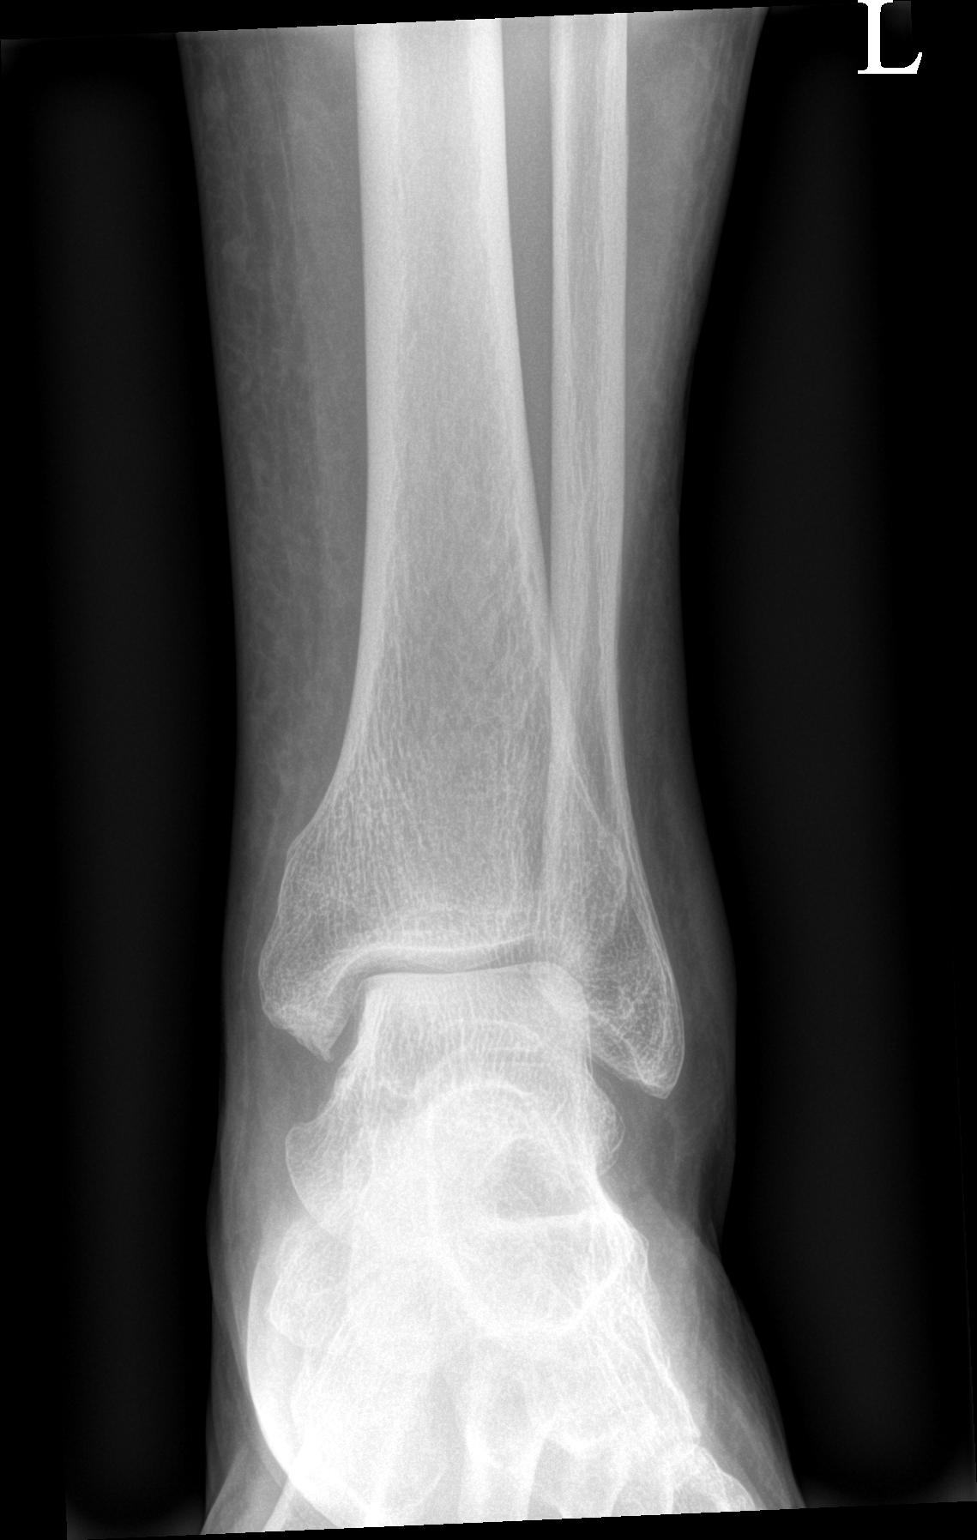

[ankle obl]
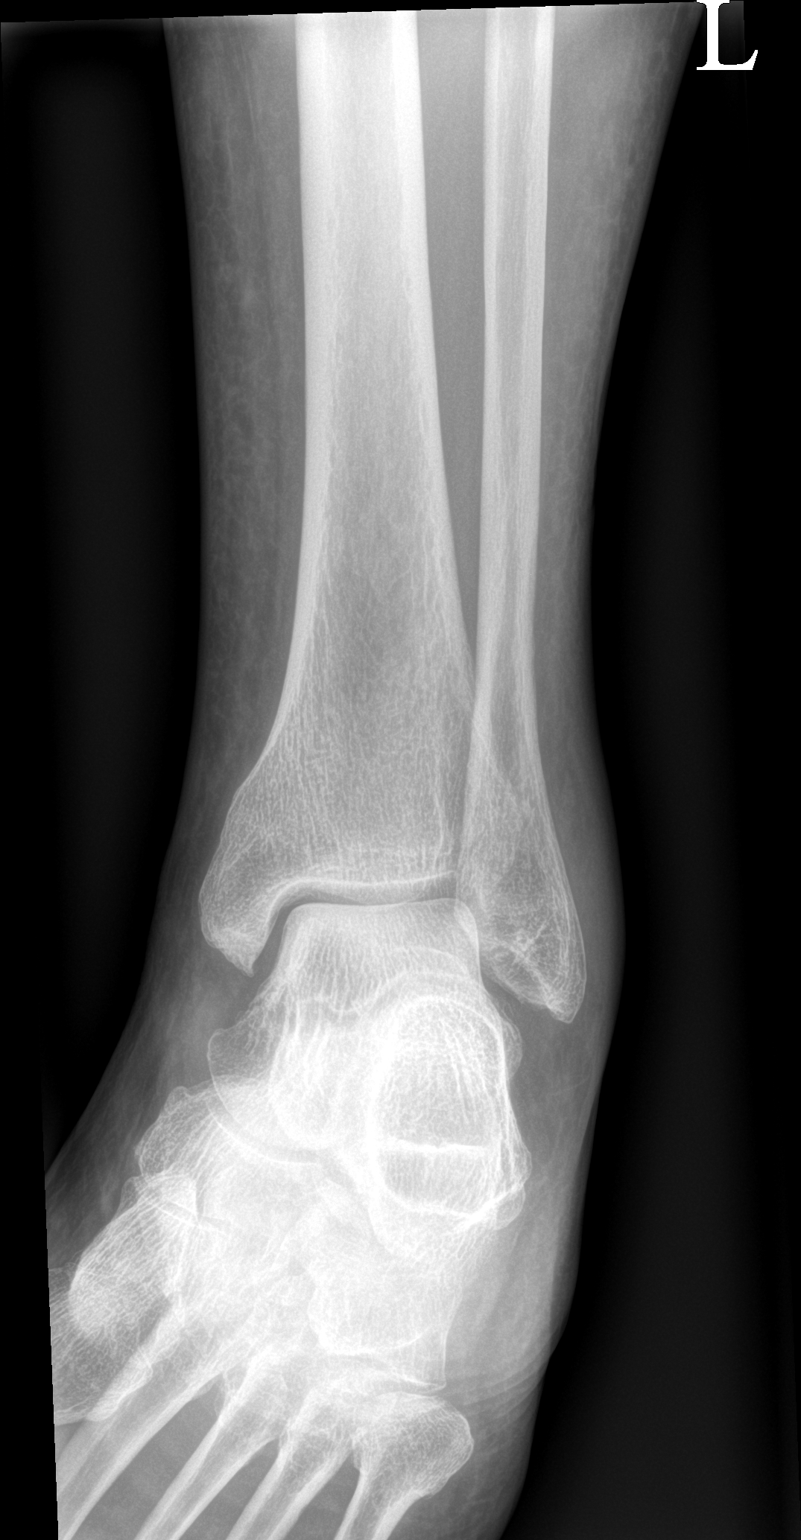

[ankle lat]
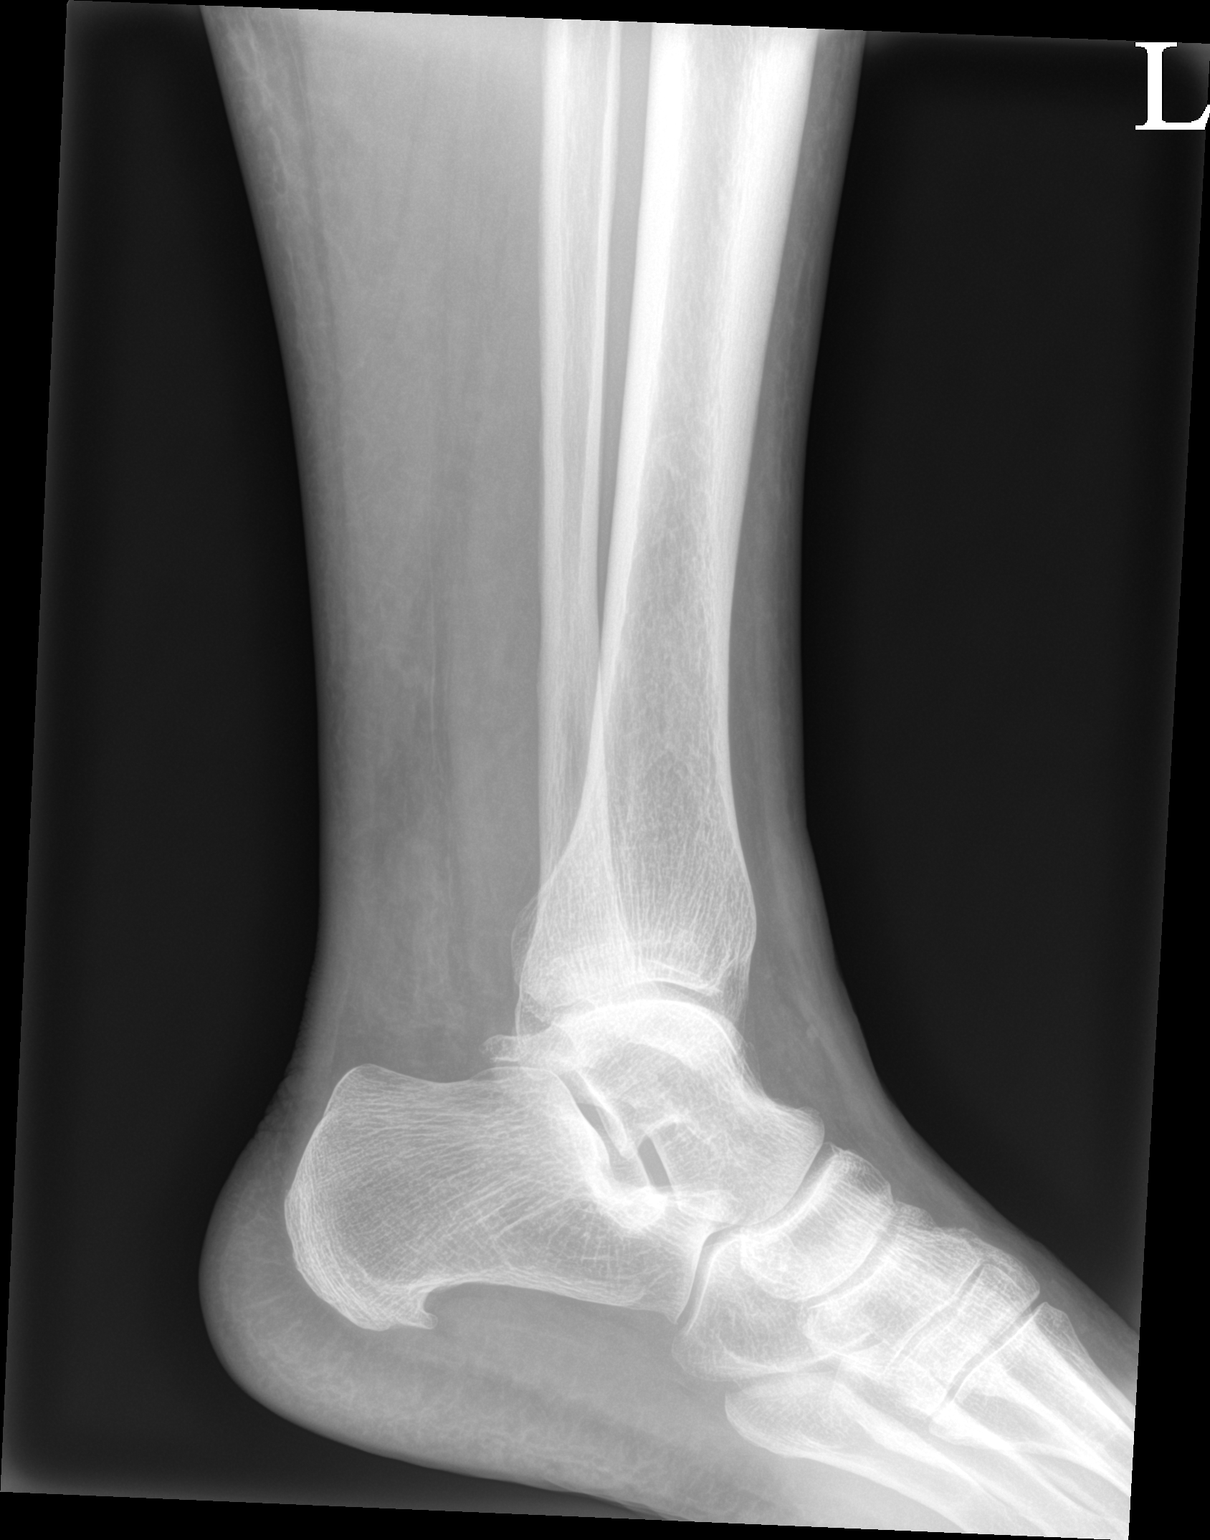

[3 of 3 positions shown; findings below may reference images not displayed]

FINDINGS: There is no evidence of fracture, dislocation, or joint effusion.
There is no evidence of arthropathy or other focal bone abnormality.
Soft tissues are unremarkable.
IMPRESSION: Negative.

## 2023-04-27 DIAGNOSIS — L821 Other seborrheic keratosis: Secondary | ICD-10-CM | POA: Diagnosis not present

## 2023-04-27 DIAGNOSIS — D485 Neoplasm of uncertain behavior of skin: Secondary | ICD-10-CM | POA: Diagnosis not present

## 2023-04-27 DIAGNOSIS — L739 Follicular disorder, unspecified: Secondary | ICD-10-CM | POA: Diagnosis not present

## 2023-06-13 ENCOUNTER — Ambulatory Visit: Payer: No Typology Code available for payment source | Admitting: Internal Medicine

## 2023-06-30 ENCOUNTER — Encounter: Payer: Self-pay | Admitting: Internal Medicine

## 2023-07-02 ENCOUNTER — Other Ambulatory Visit: Payer: Self-pay | Admitting: Internal Medicine

## 2023-07-02 MED ORDER — "NEEDLE (DISP) 16G X 3/4"" MISC": 3 refills | Status: DC

## 2023-07-14 ENCOUNTER — Telehealth: Payer: Medicare Other

## 2023-07-29 ENCOUNTER — Telehealth: Payer: Self-pay | Admitting: Internal Medicine

## 2023-07-29 NOTE — Telephone Encounter (Signed)
 Copied from CRM [id number]. Topic: Clinical - Medical Advice >> Jul 29, 2023 11:26 AM Almira Coaster wrote: Reason for CRM: Patient spouse is calling to see if patient would be able to get a pneumonia vaccine on 08/11/2023. She is scheduled to see Dr.Plotnikov on that date and the patient will be coming along.

## 2023-07-30 ENCOUNTER — Other Ambulatory Visit: Payer: Self-pay | Admitting: Internal Medicine

## 2023-08-01 NOTE — Telephone Encounter (Signed)
 I was able to add the pt to the nurse schedule for a Pneumonia vacc. Pt can get this vacc when he comes in with his spouse for apptmnt with PCP.

## 2023-08-11 ENCOUNTER — Ambulatory Visit

## 2023-08-11 DIAGNOSIS — Z23 Encounter for immunization: Secondary | ICD-10-CM

## 2023-08-11 NOTE — Progress Notes (Addendum)
 Pt received Prevnar 20 w/o any complications   Medical screening examination/treatment/procedure(s) were performed by non-physician practitioner and as supervising physician I was immediately available for consultation/collaboration.  I agree with above. Jacinta Shoe, MD

## 2023-08-15 DIAGNOSIS — N200 Calculus of kidney: Secondary | ICD-10-CM | POA: Diagnosis not present

## 2023-08-15 DIAGNOSIS — N1831 Chronic kidney disease, stage 3a: Secondary | ICD-10-CM | POA: Diagnosis not present

## 2023-08-15 DIAGNOSIS — N261 Atrophy of kidney (terminal): Secondary | ICD-10-CM | POA: Diagnosis not present

## 2023-08-15 DIAGNOSIS — I129 Hypertensive chronic kidney disease with stage 1 through stage 4 chronic kidney disease, or unspecified chronic kidney disease: Secondary | ICD-10-CM | POA: Diagnosis not present

## 2023-08-15 DIAGNOSIS — I10 Essential (primary) hypertension: Secondary | ICD-10-CM | POA: Diagnosis not present

## 2023-08-17 ENCOUNTER — Other Ambulatory Visit: Payer: Self-pay | Admitting: Internal Medicine

## 2023-09-12 DIAGNOSIS — G20B2 Parkinson's disease with dyskinesia, with fluctuations: Secondary | ICD-10-CM | POA: Diagnosis not present

## 2023-09-21 DIAGNOSIS — H43393 Other vitreous opacities, bilateral: Secondary | ICD-10-CM | POA: Diagnosis not present

## 2023-09-21 DIAGNOSIS — H26491 Other secondary cataract, right eye: Secondary | ICD-10-CM | POA: Diagnosis not present

## 2023-09-21 DIAGNOSIS — Z961 Presence of intraocular lens: Secondary | ICD-10-CM | POA: Diagnosis not present

## 2023-10-11 DIAGNOSIS — R3915 Urgency of urination: Secondary | ICD-10-CM | POA: Diagnosis not present

## 2023-10-11 DIAGNOSIS — N132 Hydronephrosis with renal and ureteral calculous obstruction: Secondary | ICD-10-CM | POA: Diagnosis not present

## 2023-11-01 ENCOUNTER — Encounter: Payer: Self-pay | Admitting: Internal Medicine

## 2023-11-01 ENCOUNTER — Ambulatory Visit (INDEPENDENT_AMBULATORY_CARE_PROVIDER_SITE_OTHER): Admitting: Internal Medicine

## 2023-11-01 VITALS — BP 110/70 | HR 49 | Temp 98.3°F | Ht 69.0 in | Wt 181.0 lb

## 2023-11-01 DIAGNOSIS — I8393 Asymptomatic varicose veins of bilateral lower extremities: Secondary | ICD-10-CM

## 2023-11-01 DIAGNOSIS — E034 Atrophy of thyroid (acquired): Secondary | ICD-10-CM | POA: Diagnosis not present

## 2023-11-01 DIAGNOSIS — E538 Deficiency of other specified B group vitamins: Secondary | ICD-10-CM

## 2023-11-01 DIAGNOSIS — E291 Testicular hypofunction: Secondary | ICD-10-CM | POA: Diagnosis not present

## 2023-11-01 DIAGNOSIS — I10 Essential (primary) hypertension: Secondary | ICD-10-CM | POA: Diagnosis not present

## 2023-11-01 DIAGNOSIS — G20B1 Parkinson's disease with dyskinesia, without mention of fluctuations: Secondary | ICD-10-CM

## 2023-11-01 DIAGNOSIS — I8390 Asymptomatic varicose veins of unspecified lower extremity: Secondary | ICD-10-CM | POA: Insufficient documentation

## 2023-11-01 MED ORDER — TESTOSTERONE CYPIONATE 200 MG/ML IM SOLN: 200.0000 mg | INTRAMUSCULAR | 1 refills | Status: DC

## 2023-11-01 NOTE — Progress Notes (Signed)
 Subjective:  Patient ID: Adrian [name], male    DOB: June 18, 1950  Age: 73 y.o. MRN: [id number]  CC: Medical Management of Chronic Issues (Pt wants to f/u on Testosterone  and discuss varicose veins )   HPI [name] presents for hypogonadism, varicouse veins  Outpatient Medications Prior to Visit  Medication Sig Dispense Refill   Amantadine HCl 100 MG tablet Take 100 mg by mouth 2 (two) times daily.     BD HYPODERMIC NEEDLE 18G X 1" MISC USE THE NEEDLE TO DRAW TESTOSTERONE  OUT OF THE VIAL WEEKLY 50 each 3   bisacodyl (DULCOLAX) 5 MG EC tablet Take 5 mg by mouth every other day.     cetirizine (ZYRTEC) 10 MG tablet Take 10 mg by mouth daily.     colchicine  0.6 MG tablet Take 1 tablet (0.6 mg total) by mouth daily as needed (gout or psuedogout pain). 30 tablet 2   CREXONT 70-280 MG CPCR Take 2 capsules by mouth 3 (three) times daily.     halobetasol  (ULTRAVATE ) 0.05 % cream Apply topically 2 times daily as needed 45 g 1   levothyroxine  (SYNTHROID ) 75 MCG tablet Take 1 tablet (75 mcg total) by mouth daily. 90 tablet 3   losartan  (COZAAR ) 100 MG tablet Take 1 tablet (100 mg total) by mouth daily. 90 tablet 3   melatonin 5 MG TABS Take 5 mg by mouth at bedtime.     Misc Natural Products (TART CHERRY ADVANCED) CAPS Take 1 capsule by mouth daily.     Multiple Vitamins-Minerals (CENTRUM SILVER  ADULT 50+ PO) Take 1 tablet by mouth at bedtime.     oxybutynin (DITROPAN-XL) 10 MG 24 hr tablet Take 10 mg by mouth daily.     Plecanatide  (TRULANCE ) 3 MG TABS Take 1 tablet (3 mg total) by mouth daily. For constipation 30 tablet 11   Probiotic Product (PROBIOTIC DAILY PO) Take 1 tablet by mouth at bedtime.     rosuvastatin  (CRESTOR ) 10 MG tablet Take 1 tablet (10 mg total) by mouth daily. 90 tablet 2   sertraline  (ZOLOFT ) 100 MG tablet Take 100 mg by mouth daily.     sildenafil  (VIAGRA ) 100 MG tablet Take 0.5-1 tablets (50-100 mg total) by mouth daily as needed for erectile dysfunction. 12 tablet  11   SYRINGE-NEEDLE, DISP, 3 ML (BD ECLIPSE SYRINGE) 25G X 1" 3 ML MISC As directed 50 each 3   traZODone  (DESYREL ) 50 MG tablet TAKE 1 TABLET BY MOUTH AT BEDTIME 90 tablet 3   triamcinolone  cream (KENALOG) 0.1 % Apply 1 Application topically daily as needed (irritation).     TURMERIC PO Take 1 capsule by mouth daily.     VALERIAN ROOT PO Take 1,500 mg by mouth at bedtime.     testosterone  cypionate (DEPOTESTOSTERONE CYPIONATE) 200 MG/ML injection INJECT 1 ML ( 200 MG TOTAL) INTO THE MUSCLE EVERY 14 DAYS 6 mL 1   entacapone (COMTAN) 200 MG tablet Take 200 mg by mouth 4 (four) times daily.     carbidopa -levodopa  (SINEMET  IR) 25-100 MG tablet Take 1 tablet by mouth 4 (four) times daily. (Patient taking differently: Take 2 tablets by mouth 4 (four) times daily.) 360 tablet 3   No facility-administered medications prior to visit.    ROS: Review of Systems  Constitutional:  Negative for appetite change, fatigue and unexpected weight change.  HENT:  Negative for congestion, nosebleeds, sneezing, sore throat and trouble swallowing.   Eyes:  Negative for itching and visual disturbance.  Respiratory:  Negative for cough.   Cardiovascular:  Negative for chest pain, palpitations and leg swelling.  Gastrointestinal:  Negative for abdominal distention, blood in stool, diarrhea and nausea.  Genitourinary:  Negative for frequency and hematuria.  Musculoskeletal:  Positive for gait problem. Negative for back pain, joint swelling and neck pain.  Skin:  Negative for rash.  Neurological:  Negative for dizziness, tremors, speech difficulty and weakness.  Psychiatric/Behavioral:  Negative for agitation, dysphoric mood and sleep disturbance. The patient is not nervous/anxious.     Objective:  BP 110/70   Pulse (!) 49   Temp 98.3 F (36.8 C) (Oral)   Ht 5\' 9"  (1.753 m)   Wt 181 lb (82.1 kg)   SpO2 96%   BMI 26.73 kg/m   BP Readings from Last 3 Encounters:  11/01/23 110/70  03/10/23 120/70   12/08/22 122/74    Wt Readings from Last 3 Encounters:  11/01/23 181 lb (82.1 kg)  03/10/23 188 lb (85.3 kg)  03/10/23 194 lb (88 kg)    Physical Exam Constitutional:      General: He is not in acute distress.    Appearance: He is well-developed.     Comments: NAD  Eyes:     Conjunctiva/sclera: Conjunctivae normal.     Pupils: Pupils are equal, round, and reactive to light.  Neck:     Thyroid : No thyromegaly.     Vascular: No JVD.  Cardiovascular:     Rate and Rhythm: Normal rate and regular rhythm.     Heart sounds: Normal heart sounds. No murmur heard.    No friction rub. No gallop.  Pulmonary:     Effort: Pulmonary effort is normal. No respiratory distress.     Breath sounds: Normal breath sounds. No wheezing or rales.  Chest:     Chest wall: No tenderness.  Abdominal:     General: Bowel sounds are normal. There is no distension.     Palpations: Abdomen is soft. There is no mass.     Tenderness: There is no abdominal tenderness. There is no guarding or rebound.  Musculoskeletal:        General: No tenderness. Normal range of motion.     Cervical back: Normal range of motion.  Lymphadenopathy:     Cervical: No cervical adenopathy.  Skin:    General: Skin is warm and dry.     Findings: No rash.  Neurological:     Mental Status: He is alert and oriented to person, place, and time.     Cranial Nerves: No cranial nerve deficit.     Motor: No abnormal muscle tone.     Coordination: Coordination normal.     Gait: Gait normal.     Deep Tendon Reflexes: Reflexes are normal and symmetric.  Psychiatric:        Behavior: Behavior normal.        Thought Content: Thought content normal.        Judgment: Judgment normal.   Small varicouse veins on B legs, NT  Lab Results  Component Value Date   WBC 5.1 03/14/2023   HGB 14.9 03/14/2023   HCT 45.4 03/14/2023   PLT 151.0 03/14/2023   GLUCOSE 100 (H) 03/14/2023   CHOL 105 06/04/2022   TRIG 91.0 06/04/2022   HDL 53.00  06/04/2022   LDLDIRECT 66.0 05/10/2016   LDLCALC 34 06/04/2022   ALT 6 03/14/2023   AST 17 03/14/2023   NA 137 03/14/2023   K 4.2 03/14/2023   CL 105  03/14/2023   CREATININE 1.32 03/14/2023   BUN 19 03/14/2023   CO2 26 03/14/2023   TSH 3.48 12/08/2022   PSA 1.05 06/04/2022   INR 1.0 RATIO 09/18/2007   HGBA1C 5.6 06/20/2019    MR Angiogram Chest W Wo Contrast Result Date: 11/17/2022 CLINICAL DATA:  aortic aneurysm EXAM: MRA CHEST WITH OR WITHOUT CONTRAST TECHNIQUE: Angiographic images of the chest were obtained using MRA technique without and with intravenous contrast. CONTRAST:  8mL GADAVIST  GADOBUTROL  1 MMOL/ML IV SOLN COMPARISON:  March 2023 FINDINGS: Cardiovascular: Preferential opacification of the thoracic aorta. Ascending thoracic aortic aneurysm measures 4.2 cm, previously estimated 4.3 cm. Normal heart size. No pericardial effusion. Mediastinum/Nodes: No enlarged mediastinal, hilar, or axillary lymph nodes. Lungs/Pleura: No pleural effusion. No pneumothorax. No mass or lobar consolidation. Musculoskeletal: No aggressive osseous lesions. Upper abdomen: Similar appearance of nonenhancing simple appearing hepatic cyst in segment 4B of the liver, which requires no further imaging workup. The visualized upper abdomen is otherwise unremarkable. IMPRESSION: VASCULAR 1. Similar appearance of ascending thoracic aortic aneurysm measuring 4.2 cm on today's exam. Recommend annual imaging followup by CTA or MRA. This recommendation follows 2010 ACCF/AHA/AATS/ACR/ASA/SCA/SCAI/SIR/STS/SVM Guidelines for the Diagnosis and Management of Patients with Thoracic Aortic Disease. Circulation. 2010; 121: O962-X528. Aortic aneurysm NOS (ICD10-I71.9) NON-VASCULAR 1. No acute extravascular findings in the chest. Electronically Signed   By: Maryann Smalls M.D.   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 Check labs      Parkinson disease (HCC)   In PT      Relevant Medications   CREXONT 70-280 MG CPCR   Varicose vein of leg   Small varicouse veins on B legs, NT         Meds ordered this encounter  Medications   testosterone  cypionate (DEPOTESTOSTERONE CYPIONATE) 200 MG/ML injection    Sig: Inject 1 mL (200 mg total) into the muscle every 14 (fourteen) days.    Dispense:  6 mL    Refill:  1      Follow-up: Return in about 3 months (around 02/01/2024) for a follow-up visit.  Anitra Barn, MD

## 2023-11-01 NOTE — Assessment & Plan Note (Signed)
On Levothyroxine Check TSH and FT4 

## 2023-11-01 NOTE — Assessment & Plan Note (Signed)
On Testosterone inj - doing well  Potential benefits of a long term testosterone use as well as potential risks (BPH, MI, OSA, cancer)  and complications were explained to the patient and were aknowledged. Check labs

## 2023-11-01 NOTE — Assessment & Plan Note (Signed)
 Small varicouse veins on B legs, NT

## 2023-11-01 NOTE — Assessment & Plan Note (Signed)
On Vit B12 

## 2023-11-01 NOTE — Assessment & Plan Note (Signed)
In PT 

## 2023-11-01 NOTE — Assessment & Plan Note (Signed)
 Cont on Losartan

## 2023-11-10 NOTE — Progress Notes (Unsigned)
 [facility] Return Visit   Referring Provider Plotnikov, Oakley Bellman, MD [address] [place],  [place] [address]  Primary Care Provider Plotnikov, Oakley Bellman, MD  Patient Profile: Adrian [name] is a 73 y.o. male with a past medical history noteworthy for Parkinson's disease, HLD, HTN, depression, hypothyroidism, trigeminal neuralgia, vitamin B12 deficiency who returns to the [facility] for follow-up of the problem(s) noted below.  Problem List: History of adenomatous and sessile serrated colon polyps; resection of advanced adenomas 2013 Constipation Left colon diverticulosis History of GERD Unintentional weight loss   History of Present Illness   Mr. Lesesne was last seen in the GI office 10/2021 by Dr. Sandrea Cruel at the time of surveillance colonoscopy   Current GI Meds  Daily OTCs she will follow-up Dulcolax as needed  Interval History   [name] presents to the office today to discuss timing of future colonoscopy as well as symptoms of constipation and unintentional weight loss  Reports a longstanding history of constipation which may have worsened after diagnosis of Parkinson's disease Takes daily OTC stool softener and 1 Dulcolax every 3 days On this regimen he is able to pass 1 large stool daily Denies abdominal pain or cramping No straining to defecate No blood or mucus in his stool Denies fecal urgency, tenesmus, nocturnal bowel movements or incontinence Has a history of hypothyroidism and states that this has overall been stable -last TSH checked almost 1 year ago  Xaine has had 5 colonoscopies all of which have shown polyps. Noted that in 2013 he had 2 large adenomatous polyps 1 cm or greater with piecemeal removal of one of them. Histopathology of polyps have been adenomatous and sessile serrated polyps States that he was advised his next recall would be in 2030 based upon a finding of 2 tubular adenomas on last colonoscopy He is  worried about the prospect of developing an interval malignancy or large polyp Discussed that in the setting of his previous history of advanced adenoma could consider a 5-year interval which would be 2028 and he is comfortable with this    Last colonoscopy:  10/2021 - two 5-7 mm polyps (TA), left colon diverticulosis, IH Last endoscopy: None  Last Abd CT/CTE/MRE:  None  GI Review of Symptoms Significant for {[id number]}. Otherwise negative.  General Review of Systems  Review of systems is significant for the pertinent positives and negatives as listed per the HPI.  Full ROS is otherwise negative.  Past Medical History   Past Medical History:  Diagnosis Date   Allergic rhinitis    Arthritis    Cancer (HCC) 2016   basal cell skin cancer nose   Cataract    Chronic kidney disease    Depression    GERD (gastroesophageal reflux disease)    Hyperlipidemia    Hypertension    Hypothyroidism 2010   mild   Hypothyroidism    Parkinson disease (HCC)    Trigeminal neuralgia    L.   Vitamin B12 deficiency 2010     Past Surgical History   Past Surgical History:  Procedure Laterality Date   ARTHRODESIS METATARSALPHALANGEAL JOINT (MTPJ) Right 07/27/2018   Procedure: Right hallux metatarsal phalangeal joint arthrodesis;  Surgeon: Amada Backer, MD;  Location: [facility];  Service: Orthopedics;  Laterality: Right;   arthroscopic surgery for chondromalacia Left 1995   CATARACT EXTRACTION W/ INTRAOCULAR LENS  IMPLANT, BILATERAL  2017   COLONOSCOPY  05/26/2016   Dr.Stark   gamma knife surgery for trigeminal  neuralgia  2005   inguinal herniorrhahpy  1991   L.   POLYPECTOMY     THUMB ARTHROSCOPY Right 07/07/2021   TONSILLECTOMY  1959   TRIGEMINAL NERVE DECOMPRESSION  2008   Left  Dr. Doralee Gallop     Allergies and Medications   Allergies  Allergen Reactions   Bacitra-Neomycin -Polymyxin-Hc Rash   Other     Pt prefers not to have oxycodone  or hydrocone, would like to  have tramadol    Tape Rash    Irritation   Elavil  [Amitriptyline  Hcl]     constipation   Tadalafil      HA   Neomy-Bacit-Polymyx-Pramoxine Rash    Itching    Triple Antibiotic [Bacitracin-Neomycin -Polymyxin] Rash   Wound Dressing Adhesive Rash   @MEDSTODAY @  Family His   Family History  Problem Relation Age of Onset   Heart disease Mother 33       MI   Diabetes Mother    Prostate cancer Father 77       prostate   Breast cancer Sister 24       breast ca   Stroke Brother    Colon cancer Neg Hx    Esophageal cancer Neg Hx    Rectal cancer Neg Hx    Stomach cancer Neg Hx    GI Specific Family History: {[id number]}   Social History   Social History   Tobacco Use   Smoking status: Former    Current packs/day: 0.00    Types: Cigarettes    Quit date: 05/31/1992    Years since quitting: 31.4   Smokeless tobacco: Never  Vaping Use   Vaping status: Never Used  Substance Use Topics   Alcohol use: Yes    Alcohol/week: 14.0 standard drinks of alcohol    Types: 14 Glasses of wine per week    Comment: social   Drug use: No   Kostas reports that he quit smoking about 31 years ago. His smoking use included cigarettes. He has never used smokeless tobacco. He reports current alcohol use of about 14.0 standard drinks of alcohol per week. He reports that he does not use drugs.  Vital Signs and Physical Examination   There were no vitals filed for this visit. There is no height or weight on file to calculate BMI.    General: Well developed, well nourished, no acute distress Head: Normocephalic and atraumatic Eyes: Sclerae anicteric, EOMI Ears: Normal auditory acuity Mouth: No deformities or lesions noted Lungs: Clear throughout to auscultation Heart: Regular rate and rhythm; No murmurs, rubs or bruits Abdomen: Soft, non tender and non distended. No masses, hepatosplenomegaly or hernias noted. Normal Bowel sounds Rectal: Musculoskeletal: Symmetrical with no gross  deformities  Pulses:  Normal pulses noted Extremities: No edema or deformities noted Neurological: Alert oriented x 4, grossly nonfocal Psychological:  Alert and cooperative. Normal mood and affect   Review of Data   The following data was reviewed at the time of this encounter:   Laboratory Studies      Latest Ref Rng & Units 03/14/2023   11:12 AM 09/13/2022   10:26 AM 06/30/2022   10:24 AM  CBC  WBC 4.0 - 10.5 K/uL 5.1   4.7   Hemoglobin 13.0 - 17.0 g/dL 16.1  09.6  04.5   Hematocrit 39.0 - 52.0 % 45.4   43.7   Platelets 150.0 - 400.0 K/uL 151.0   144     No results found for: LIPASE    Latest Ref Rng & Units 03/14/2023  11:12 AM 12/08/2022   10:55 AM 09/13/2022   10:26 AM  CMP  Glucose 70 - 99 mg/dL 161  096  94   BUN 6 - 23 mg/dL 19  19  19    Creatinine 0.40 - 1.50 mg/dL 0.45  4.09  8.11   Sodium 135 - 145 mEq/L 137  140  140   Potassium 3.5 - 5.1 mEq/L 4.2  4.4  4.6   Chloride 96 - 112 mEq/L 105  105  104   CO2 19 - 32 mEq/L 26  29  25    Calcium  8.4 - 10.5 mg/dL 9.5  9.3  9.5   Total Protein 6.0 - 8.3 g/dL 6.7  6.7    Total Bilirubin 0.2 - 1.2 mg/dL 0.6  0.7    Alkaline Phos 39 - 117 U/L 43  52    AST 0 - 37 U/L 17  15    ALT 0 - 53 U/L 6  6       Imaging Studies  None  GI Procedures and Studies  Colonoscopy 11/03/2021 Two 5-7 mm polyps (TA), left colon diverticulosis, IH  Colonoscopy 04/2016 Three 5-7 mm polyps( TA, SSP), left colon diverticulosis  Colonoscopy 04/2013 Two 5-9 mm polyps (SSP/adenoma), left colon diverticulosis, IH  Colonoscopy 03/2012 15 mm sessile polyp in ascending colon removed in piecemeal fashion 1 cm polyp in transverse colon removed with hot snare Path: SSA, TA  Colonoscopy 12/2001 Left colon diverticulosis   Clinical Impression  It is my clinical impression that Mr. Bonelli is a 73 y.o. male with;  ***  Plan  *** *** *** *** ***   Planned Follow Up No follow-ups on file.  The patient or caregiver verbalized  understanding of the material covered, with no barriers to understanding. All questions were answered. Patient or caregiver is agreeable with the plan outlined above.    It was a pleasure to see Loye.  If you have any questions or concerns regarding this evaluation, do not hesitate to contact me.  Eugenia Hess, MD [facility]

## 2023-11-11 ENCOUNTER — Ambulatory Visit: Admitting: Pediatrics

## 2023-11-11 ENCOUNTER — Encounter: Payer: Self-pay | Admitting: Pediatrics

## 2023-11-11 ENCOUNTER — Other Ambulatory Visit (INDEPENDENT_AMBULATORY_CARE_PROVIDER_SITE_OTHER)

## 2023-11-11 VITALS — BP 120/80 | HR 51 | Ht 68.0 in | Wt 179.0 lb

## 2023-11-11 DIAGNOSIS — R634 Abnormal weight loss: Secondary | ICD-10-CM

## 2023-11-11 DIAGNOSIS — K219 Gastro-esophageal reflux disease without esophagitis: Secondary | ICD-10-CM | POA: Diagnosis not present

## 2023-11-11 DIAGNOSIS — K5900 Constipation, unspecified: Secondary | ICD-10-CM | POA: Diagnosis not present

## 2023-11-11 DIAGNOSIS — K635 Polyp of colon: Secondary | ICD-10-CM

## 2023-11-11 LAB — COMPREHENSIVE METABOLIC PANEL WITH GFR
ALT: 4 U/L (ref 0–53)
AST: 16 U/L (ref 0–37)
Albumin: 4.8 g/dL (ref 3.5–5.2)
Alkaline Phosphatase: 47 U/L (ref 39–117)
BUN: 19 mg/dL (ref 6–23)
CO2: 30 meq/L (ref 19–32)
Calcium: 9.6 mg/dL (ref 8.4–10.5)
Chloride: 106 meq/L (ref 96–112)
Creatinine, Ser: 1.42 mg/dL (ref 0.40–1.50)
GFR: 49.25 mL/min — ABNORMAL LOW (ref >60.00)
Glucose, Bld: 101 mg/dL — ABNORMAL HIGH (ref 70–99)
Potassium: 5.1 meq/L (ref 3.5–5.1)
Sodium: 142 meq/L (ref 135–145)
Total Bilirubin: 0.5 mg/dL (ref 0.2–1.2)
Total Protein: 7.1 g/dL (ref 6.0–8.3)

## 2023-11-11 LAB — CBC WITH DIFFERENTIAL/PLATELET
Basophils Absolute: 0 10*3/uL (ref 0.0–0.1)
Basophils Relative: 0.4 % (ref 0.0–3.0)
Eosinophils Absolute: 0.2 10*3/uL (ref 0.0–0.7)
Eosinophils Relative: 4.1 % (ref 0.0–5.0)
HCT: 48 % (ref 39.0–52.0)
Hemoglobin: 16.1 g/dL (ref 13.0–17.0)
Lymphocytes Relative: 31.5 % (ref 12.0–46.0)
Lymphs Abs: 1.5 10*3/uL (ref 0.7–4.0)
MCHC: 33.5 g/dL (ref 30.0–36.0)
MCV: 93.2 fl (ref 78.0–100.0)
Monocytes Absolute: 0.5 10*3/uL (ref 0.1–1.0)
Monocytes Relative: 10.4 % (ref 3.0–12.0)
Neutro Abs: 2.6 10*3/uL (ref 1.4–7.7)
Neutrophils Relative %: 53.6 % (ref 43.0–77.0)
Platelets: 156 10*3/uL (ref 150.0–400.0)
RBC: 5.15 Mil/uL (ref 4.22–5.81)
RDW: 13.6 % (ref 11.5–15.5)
WBC: 4.8 10*3/uL (ref 4.0–10.5)

## 2023-11-11 LAB — IBC + FERRITIN
Ferritin: 94.5 ng/mL (ref 22.0–322.0)
Iron: 128 ug/dL (ref 42–165)
Saturation Ratios: 35.6 % (ref 20.0–50.0)
TIBC: 359.8 ug/dL (ref 250.0–450.0)
Transferrin: 257 mg/dL (ref 212.0–360.0)

## 2023-11-11 LAB — C-REACTIVE PROTEIN: CRP: <1 mg/dL (ref 0.5–20.0)

## 2023-11-11 LAB — B12 AND FOLATE PANEL
Folate: >23.2 ng/mL (ref >5.9)
Vitamin B-12: 384 pg/mL (ref 211–911)

## 2023-11-11 LAB — TSH: TSH: 3.26 u[IU]/mL (ref 0.35–5.50)

## 2023-11-11 LAB — SEDIMENTATION RATE: Sed Rate: 2 mm/h (ref 0–20)

## 2023-11-11 LAB — VITAMIN D 25 HYDROXY (VIT D DEFICIENCY, FRACTURES): VITD: 54.74 ng/mL (ref 30.00–100.00)

## 2023-11-11 NOTE — Patient Instructions (Signed)
 Your provider has requested that you go to the basement level for lab work before leaving today. Press B on the elevator. The lab is located at the first door on the left as you exit the elevator.  Due to recent changes in healthcare laws, you may see the results of your imaging and laboratory studies on MyChart before your provider has had a chance to review them.  We understand that in some cases there may be results that are confusing or concerning to you. Not all laboratory results come back in the same time frame and the provider may be waiting for multiple results in order to interpret others.  Please give us  48 hours in order for your provider to thoroughly review all the results before contacting the office for clarification of your results.    Follow up as needed.  Thank you for entrusting me with your care and for choosing [facility], Dr. Eugenia Hess   _______________________________________________________  If your blood pressure at your visit was 140/90 or greater, please contact your primary care physician to follow up on this.  _______________________________________________________  If you are age 40 or older, your body mass index should be between 23-30. Your Body mass index is 27.22 kg/m. If this is out of the aforementioned range listed, please consider follow up with your Primary Care Provider.  If you are age 17 or younger, your body mass index should be between 19-25. Your Body mass index is 27.22 kg/m. If this is out of the aformentioned range listed, please consider follow up with your Primary Care Provider.   ________________________________________________________  The  GI providers would like to encourage you to use MYCHART to communicate with providers for non-urgent requests or questions.  Due to long hold times on the telephone, sending your provider a message by [facility] may be a faster and more efficient way to get a response.  Please allow 48  business hours for a response.  Please remember that this is for non-urgent requests.  _______________________________________________________

## 2023-11-12 ENCOUNTER — Encounter: Payer: Self-pay | Admitting: Pediatrics

## 2023-11-12 ENCOUNTER — Ambulatory Visit: Payer: Self-pay | Admitting: Pediatrics

## 2023-11-12 LAB — TISSUE TRANSGLUTAMINASE ABS,IGG,IGA
(tTG) Ab, IgA: <1 U/mL
(tTG) Ab, IgG: <1 U/mL

## 2023-11-12 LAB — IGA: Immunoglobulin A: 284 mg/dL (ref 70–320)

## 2023-12-14 ENCOUNTER — Other Ambulatory Visit: Payer: Self-pay | Admitting: Internal Medicine

## 2023-12-26 ENCOUNTER — Encounter: Payer: Self-pay | Admitting: Cardiovascular Disease

## 2023-12-26 ENCOUNTER — Ambulatory Visit: Attending: Cardiovascular Disease | Admitting: Cardiovascular Disease

## 2023-12-26 VITALS — BP 114/70 | HR 59 | Ht 69.0 in | Wt 180.2 lb

## 2023-12-26 DIAGNOSIS — I2510 Atherosclerotic heart disease of native coronary artery without angina pectoris: Secondary | ICD-10-CM

## 2023-12-26 DIAGNOSIS — I10 Essential (primary) hypertension: Secondary | ICD-10-CM

## 2023-12-26 DIAGNOSIS — I7121 Aneurysm of the ascending aorta, without rupture: Secondary | ICD-10-CM | POA: Diagnosis not present

## 2023-12-26 DIAGNOSIS — E785 Hyperlipidemia, unspecified: Secondary | ICD-10-CM | POA: Diagnosis not present

## 2023-12-26 DIAGNOSIS — N1831 Chronic kidney disease, stage 3a: Secondary | ICD-10-CM

## 2023-12-26 LAB — LIPID PANEL
Chol/HDL Ratio: 2 ratio (ref 0.0–5.0)
Cholesterol, Total: 75 mg/dL — ABNORMAL LOW (ref 100–199)
HDL: 37 mg/dL — ABNORMAL LOW (ref >39)
LDL Chol Calc (NIH): 16 mg/dL (ref 0–99)
Triglycerides: 123 mg/dL (ref 0–149)
VLDL Cholesterol Cal: 22 mg/dL (ref 5–40)

## 2023-12-26 NOTE — Patient Instructions (Signed)
 Medication Instructions:  No changes *If you need a refill on your cardiac medications before your next appointment, please call your pharmacy*  Lab Work: Lipid panel If you have labs (blood work) drawn today and your tests are completely normal, you will receive your results only by: MyChart Message (if you have MyChart) OR A paper copy in the mail If you have any lab test that is abnormal or we need to change your treatment, we will call you to review the results.  Testing/Procedures: No orders  Follow-Up: At [facility], you and your health needs are our priority.  As part of our continuing mission to provide you with exceptional heart care, our providers are all part of one team.  This team includes your primary Cardiologist (physician) and Advanced Practice Providers or APPs (Physician Assistants and Nurse Practitioners) who all work together to provide you with the care you need, when you need it.  Your next appointment:   1 year(s)  Provider:   Jerel Balding, MD    We recommend signing up for the patient portal called MyChart.  Sign up information is provided on this After Visit Summary.  MyChart is used to connect with patients for Virtual Visits (Telemedicine).  Patients are able to view lab/test results, encounter notes, upcoming appointments, etc.  Non-urgent messages can be sent to your provider as well.   To learn more about what you can do with MyChart, go to [email or web address].

## 2023-12-26 NOTE — Progress Notes (Signed)
 Cardiology Office Note:    Date:  12/26/2023   ID:  Adrian [name], DOB August 02, 1950, MRN [id number]  PCP:  Garald Karlynn GAILS, MD  Cardiologist:  Jerel Balding, MD  Electrophysiologist:  None   Referring MD: Garald Karlynn GAILS, MD   Chief Complaint  Patient presents with   Coronary Artery Disease     History of Present Illness:    Adrian [name] is a 73 y.o. 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  After the dilated ascending aorta was initially diagnosed on a coronary calcium  score CT, he underwent an MRI which showed a maximum diameter of the ascending aorta at 4.1 cm in 2023 and 4.2 in 2024.  He had a normal treadmill stress test after his elevated coronary calcium  score.  He remains completely asymptomatic.Continues to ride a bike and walk 2 miles, alternating these every other day.  The patient specifically denies any chest pain at rest or with exertion, dyspnea at rest or with exertion, orthopnea, paroxysmal nocturnal dyspnea, syncope, palpitations, focal neurological deficits, intermittent claudication, lower extremity edema, unexplained weight gain, cough, hemoptysis or wheezing. Parkinson's symptoms are well controlled, has occasional involuntary movements. Has occasional orthostatic hypotension.  On statin therapy his most recent LDL cholesterol was excellent at 34, but not rechecked since January 2024.  His most recent hemoglobin A1c was in normal range.  Creatinine is mildly elevated but stable at 1.42 (Dr. Rayburn) and  electrolytes are normal.  His blood pressure is consistently in normal range.  [name] has no symptoms of coronary disease.  Every day he either rides his bike for 20 minutes or goes on a 2 mile walk.  He  splits firewood with an axe and carries that wood into his home, without problems with shortness of breath or chest discomfort.  He does describe occasional palpitations are consistently associated with anxiety.  He does not have leg edema and denies intermittent claudication.  He has resting tremor a little worse in his left hand compared to the right that has been become steadily more noticeable over the last couple of years.  He does not have other focal neurological events and does not have a history of stroke or TIA.  Imaging studies have showed that he has an atrophied right kidney but no evidence of right renal artery stenosis.  He has mild left hydronephrosis.     Past Medical History:  Diagnosis Date   Allergic rhinitis    Arthritis    Cancer (HCC) 2016   basal cell skin cancer nose   Cataract    Chronic kidney disease    Depression    GERD (gastroesophageal reflux disease)    Hyperlipidemia    Hypertension    Hypothyroidism 2010   mild   Hypothyroidism    Parkinson disease (HCC)    Trigeminal neuralgia    L.   Vitamin B12 deficiency 2010    Past Surgical History:  Procedure Laterality Date   ARTHRODESIS METATARSALPHALANGEAL JOINT (MTPJ) Right 07/27/2018   Procedure: Right hallux metatarsal phalangeal joint arthrodesis;  Surgeon: Kit Rush, MD;  Location: [facility];  Service: Orthopedics;  Laterality: Right;   arthroscopic surgery for chondromalacia Left 1995   CATARACT EXTRACTION W/ INTRAOCULAR LENS  IMPLANT, BILATERAL  2017   COLONOSCOPY  05/26/2016   Dr.Stark  gamma knife surgery for trigeminal neuralgia  2005   inguinal herniorrhahpy  1991   L.   POLYPECTOMY     THUMB ARTHROSCOPY Right 07/07/2021   TONSILLECTOMY  1959   TRIGEMINAL NERVE DECOMPRESSION  2008   Left  Dr. Windell    Current Medications: Current Meds  Medication Sig   Amantadine HCl 100 MG tablet Take 100 mg by mouth 2 (two) times daily.   BD HYPODERMIC NEEDLE 18G X 1 MISC  USE THE NEEDLE TO DRAW TESTOSTERONE  OUT OF THE VIAL WEEKLY   bisacodyl (DULCOLAX) 5 MG EC tablet Take 5 mg by mouth every other day.   cetirizine (ZYRTEC) 10 MG tablet Take 10 mg by mouth daily.   CREXONT  70-280 MG CPCR Take 2 capsules by mouth 3 (three) times daily.   halobetasol  (ULTRAVATE ) 0.05 % cream Apply topically 2 times daily as needed   levothyroxine  (SYNTHROID ) 75 MCG tablet Take 1 tablet (75 mcg total) by mouth daily.   losartan  (COZAAR ) 100 MG tablet Take 1 tablet (100 mg total) by mouth daily.   melatonin 5 MG TABS Take 5 mg by mouth at bedtime.   Misc Natural Products (TART CHERRY ADVANCED) CAPS Take 1 capsule by mouth daily.   Multiple Vitamins-Minerals (CENTRUM SILVER  ADULT 50+ PO) Take 1 tablet by mouth at bedtime.   Plecanatide  (TRULANCE ) 3 MG TABS Take 1 tablet (3 mg total) by mouth daily. For constipation   Probiotic Product (PROBIOTIC DAILY PO) Take 1 tablet by mouth at bedtime.   rosuvastatin  (CRESTOR ) 10 MG tablet Take 1 tablet by mouth once daily   sertraline  (ZOLOFT ) 100 MG tablet Take 100 mg by mouth daily.   sildenafil  (VIAGRA ) 100 MG tablet Take 0.5-1 tablets (50-100 mg total) by mouth daily as needed for erectile dysfunction.   SYRINGE-NEEDLE, DISP, 3 ML (BD ECLIPSE SYRINGE) 25G X 1 3 ML MISC As directed   testosterone  cypionate (DEPOTESTOSTERONE CYPIONATE) 200 MG/ML injection Inject 1 mL (200 mg total) into the muscle every 14 (fourteen) days.   traZODone  (DESYREL ) 50 MG tablet TAKE 1 TABLET BY MOUTH AT BEDTIME   triamcinolone  cream (KENALOG) 0.1 % Apply 1 Application topically daily as needed (irritation).   VALERIAN ROOT PO Take 1,500 mg by mouth at bedtime.     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, broadcasting  Tobacco Use   Smoking status: Former    Current packs/day: 0.00    Types: Cigarettes    Quit date: 05/31/1992    Years since quitting: 31.5   Smokeless tobacco: Never  Vaping Use   Vaping status: Never Used  Substance and Sexual Activity   Alcohol use: Yes    Alcohol/week: 14.0 standard drinks of alcohol    Types: 14 Glasses of wine per week    Comment: social   Drug use: No   Sexual activity: Yes  Other Topics Concern   Not on file  Social History Narrative   U[employer]   Married x2: 12 years 1st; 15 years ('12) 2nd   One daughter, step daugfhter, step son   Likes to  bike   Social Drivers of Corporate investment banker Strain: Low Risk  (10/31/2023)   Overall Financial Resource Strain (CARDIA)    Difficulty of Paying Living Expenses: Not hard at all  Food Insecurity: No Food Insecurity (10/31/2023)   Hunger Vital Sign    Worried About Running Out of Food in the Last Year: Never true    Ran Out of Food in the Last Year: Never true  Transportation Needs: No Transportation Needs (10/31/2023)   PRAPARE - Administrator, Civil Service (Medical): No    Lack of Transportation (Non-Medical): No  Physical Activity: Sufficiently Active (10/31/2023)   Exercise Vital Sign    Days of Exercise per Week: 3 days    Minutes of Exercise per Session: 90 min  Stress: No Stress Concern Present (10/31/2023)   [employer] of Occupational Health - Occupational Stress Questionnaire    Feeling of Stress : Only a little  Social Connections: Socially Integrated (10/31/2023)   Social Connection and Isolation Panel    Frequency of Communication with Friends and Family: More than three times a week    Frequency of Social Gatherings with Friends and Family: Once a week    Attends  Religious Services: More than 4 times per year    Active Member of [employer] or Organizations: Yes    Attends Engineer, structural: More than 4 times per year    Marital Status: Married     Family History: The patient's family history includes Breast cancer (age of onset: 83) in his sister; Diabetes in his mother; Heart disease (age of onset: 52) in his mother; Prostate cancer (age of onset: 28) in his father; Stroke in his brother. There is no history of Colon cancer, Esophageal cancer, Rectal cancer, or Stomach cancer.  ROS:   Please see the history of present illness.     All other systems reviewed and are negative.  EKGs/Labs/Other Studies Reviewed:    The following studies were reviewed today: CT calcium  score 07/10/2019 Ascending Aorta: Normal size, measuring 42 mm at the mid ascending aorta, measured double oblique at PA bifurcation. No significant calcification. Pericardium: Normal Coronary arteries: Arise from normal coronary cusps.   IMPRESSION: Coronary calcium  score of 631. This was 80th percentile for age and sex matched control.  Renal artery duplex ultrasound 07/24/2019 1. Chronically atrophied right kidney without evidence of hemodynamically significant renal artery stenosis or occlusion. 2. Compensatory hypertrophy of the left kidney suggests that the right-sided renal atrophy is a chronic abnormality. 3. Mild left-sided hydronephrosis of uncertain etiology. 4. Mild prostatomegaly. 5. No evidence of nephrolithiasis or renal mass.  MRA 06/18/2020 IMPRESSION: Mild aneurysmal disease of the ascending thoracic aorta measuring approximately 4.1 cm in maximum diameter. Recommend annual imaging followup by CTA or MRA  Stress test 06/18/2020 There was no ST segment deviation noted during stress. No T wave inversion was noted during stress.   1. Negative ECG stress test for ischemia but only achieved 84% MPHR. Cannot exclude ischemia at higher heart rate.  2.  Average exercise capacity (6:20 min:s; 7.0 METS).  3. Normal HR/BP response to exercise.  4. 84% MPHR achieved as noted above.    EKG:    EKG Interpretation Date/Time:  Monday December 26 2023 08:00:33 EDT Ventricular Rate:  59 PR Interval:  150 QRS Duration:  92 QT Interval:  386 QTC Calculation: 382 R Axis:   -27  Text Interpretation: Sinus bradycardia Minimal voltage criteria for LVH, may be normal variant (  R in aVL ) Nonspecific T wave abnormality When compared with ECG of 30-Jun-2022 10:28, No significant change was found Confirmed by Evelynn Hench [phone]) on 12/26/2023 8:21:08 AM        Recent Labs: 11/11/2023: ALT 4; BUN 19; Creatinine, Ser 1.42; Hemoglobin 16.1; Platelets 156.0; Potassium 5.1; Sodium 142; TSH 3.26  Recent Lipid Panel    Component Value Date/Time   CHOL 105 06/04/2022 1005   TRIG 91.0 06/04/2022 1005   HDL 53.00 06/04/2022 1005   CHOLHDL 2 06/04/2022 1005   VLDL 18.2 06/04/2022 1005   LDLCALC 34 06/04/2022 1005   LDLDIRECT 66.0 05/10/2016 0820    Physical Exam:    VS:  BP 114/70 (BP Location: Left Arm, Patient Position: Sitting, Cuff Size: Normal)   Pulse (!) 59   Ht 5' 9 (1.753 m)   Wt 180 lb 3.2 oz (81.7 kg)   SpO2 97%   BMI 26.61 kg/m     Wt Readings from Last 3 Encounters:  12/26/23 180 lb 3.2 oz (81.7 kg)  11/11/23 179 lb (81.2 kg)  11/01/23 181 lb (82.1 kg)      General: Alert, oriented x3, no distress Head: no evidence of trauma, PERRL, EOMI, no exophtalmos or lid lag, no myxedema, no xanthelasma; normal ears, nose and oropharynx Neck: normal jugular venous pulsations and no hepatojugular reflux; brisk carotid pulses without delay and no carotid bruits Chest: clear to auscultation, no signs of consolidation by percussion or palpation, normal fremitus, symmetrical and full respiratory excursions Cardiovascular: normal position and quality of the apical impulse, regular rhythm, normal first and second heart sounds, no murmurs, rubs or  gallops Abdomen: no tenderness or distention, no masses by palpation, no abnormal pulsatility or arterial bruits, normal bowel sounds, no hepatosplenomegaly Extremities: no clubbing, cyanosis or edema; 2+ radial, ulnar and brachial pulses bilaterally; 2+ right femoral, posterior tibial and dorsalis pedis pulses; 2+ left femoral, posterior tibial and dorsalis pedis pulses; no subclavian or femoral bruits Neurological: grossly nonfocal. Occasional involuntary leg movement, very mild resting tremor Psych: Normal mood and affect    ASSESSMENT:    1. Coronary artery disease involving native coronary artery of native heart without angina pectoris   2. Aneurysm of ascending aorta without rupture (HCC)   3. Essential hypertension   4. Stage 3a chronic kidney disease (HCC)   5. Hyperlipidemia LDL goal <70     PLAN:    In order of problems listed above:  Coronary calcification: Asymptomatic, physically active. Calcium  score is increased to a CAD equivalent range. Continue to address risk factors. Asc Ao Aneur: Very mild dilation of the ascending aorta.  Stable dimensions on 3 consecutive tests. Change MRAngio to every 4 years (check in 2027) HTN: Controlled, no changes.  Watch for orthostatic hypotension with Parkinson's. ARB and beta blockers preferred for the aneurysm, ARB also for CKD.  CKD 3: Stable creatinine around 1.4. Seems to have a GFR of around 45-50 since 2016. His nephrologist is Dr. Rayburn.  His urologist is Dr. Matilda HLP: time to recheck. Continue statin for target LDL<70.     Medication Adjustments/Labs and Tests Ordered: Current medicines are reviewed at length with the patient today.  Concerns regarding medicines are outlined above.  Orders Placed This Encounter  Procedures   Lipid panel   EKG 12-Lead   No orders of the defined types were placed in this encounter.   Patient Instructions  Medication Instructions:  No changes *If you need a refill on your  cardiac medications  before your next appointment, please call your pharmacy*  Lab Work: Lipid panel If you have labs (blood work) drawn today and your tests are completely normal, you will receive your results only by: MyChart Message (if you have MyChart) OR A paper copy in the mail If you have any lab test that is abnormal or we need to change your treatment, we will call you to review the results.  Testing/Procedures: No orders  Follow-Up: At [facility], you and your health needs are our priority.  As part of our continuing mission to provide you with exceptional heart care, our providers are all part of one team.  This team includes your primary Cardiologist (physician) and Advanced Practice Providers or APPs (Physician Assistants and Nurse Practitioners) who all work together to provide you with the care you need, when you need it.  Your next appointment:   1 year(s)  Provider:   Jerel Balding, MD    We recommend signing up for the patient portal called MyChart.  Sign up information is provided on this After Visit Summary.  MyChart is used to connect with patients for Virtual Visits (Telemedicine).  Patients are able to view lab/test results, encounter notes, upcoming appointments, etc.  Non-urgent messages can be sent to your provider as well.   To learn more about what you can do with MyChart, go to [email or web address].         Signed, Jerel Balding, MD  12/26/2023 11:25 AM    [facility]

## 2023-12-27 ENCOUNTER — Ambulatory Visit: Payer: Self-pay | Admitting: Cardiovascular Disease

## 2024-01-04 ENCOUNTER — Other Ambulatory Visit: Payer: Self-pay | Admitting: Internal Medicine

## 2024-01-12 ENCOUNTER — Telehealth: Payer: Self-pay

## 2024-01-12 NOTE — Telephone Encounter (Signed)
 Copied from CRM [id number]. Topic: Clinical - Medication Question >> Jan 11, 2024  5:07 PM Rea C wrote: Reason for CRM: sertraline  (ZOLOFT ) 100 MG tablet [[id number]]  Patient is needing to get a refill before leaving out of town. However, he doesn't know what PCP last prescribed his medication and in the chart it is showing historical provider. The patient is wondering if Dr. Garald would put in the prescription for him. They are also going to send a message through MyChart.   [phone] (M)

## 2024-01-17 MED ORDER — SERTRALINE HCL 100 MG PO TABS: 100.0000 mg | ORAL_TABLET | Freq: Every day | ORAL | 1 refills | Status: AC

## 2024-01-17 NOTE — Addendum Note (Signed)
 Addended by: Nathon Stefanski V on: 01/17/2024 07:19 AM   Modules accepted: Orders

## 2024-01-17 NOTE — Telephone Encounter (Signed)
 Okay.  Thanks.

## 2024-01-26 ENCOUNTER — Other Ambulatory Visit: Payer: Self-pay | Admitting: Internal Medicine

## 2024-02-06 ENCOUNTER — Ambulatory Visit: Admitting: Internal Medicine

## 2024-02-08 ENCOUNTER — Ambulatory Visit

## 2024-02-08 DIAGNOSIS — Z23 Encounter for immunization: Secondary | ICD-10-CM

## 2024-02-08 NOTE — Progress Notes (Addendum)
 After obtaining consent, and per orders of Dr. Posey Rea, injection of HDF given by Ferdie Ping. Patient instructed to report any adverse reaction to me immediately.  Medical screening examination/treatment/procedure(s) were performed by non-physician practitioner and as supervising physician I was immediately available for consultation/collaboration.  I agree with above. Jacinta Shoe, MD

## 2024-02-09 DIAGNOSIS — N1831 Chronic kidney disease, stage 3a: Secondary | ICD-10-CM | POA: Diagnosis not present

## 2024-02-09 DIAGNOSIS — I10 Essential (primary) hypertension: Secondary | ICD-10-CM | POA: Diagnosis not present

## 2024-02-09 DIAGNOSIS — N261 Atrophy of kidney (terminal): Secondary | ICD-10-CM | POA: Diagnosis not present

## 2024-02-09 DIAGNOSIS — N200 Calculus of kidney: Secondary | ICD-10-CM | POA: Diagnosis not present

## 2024-02-10 LAB — LAB REPORT - SCANNED
Albumin, Urine POC: 5.2
Albumin/Creatinine Ratio, Urine, POC: 3
Creatinine, POC: 148.8 mg/dL
EGFR: 59

## 2024-02-13 ENCOUNTER — Other Ambulatory Visit: Payer: Self-pay | Admitting: Internal Medicine

## 2024-02-14 ENCOUNTER — Telehealth: Payer: Self-pay

## 2024-02-14 ENCOUNTER — Other Ambulatory Visit (HOSPITAL_COMMUNITY): Payer: Self-pay

## 2024-02-14 NOTE — Telephone Encounter (Signed)
 Pharmacy Patient Advocate Encounter   Received notification from CoverMyMeds that prior authorization for Testosterone  Cypionate 200MG /ML intramuscular solution  is required/requested.   Insurance verification completed.   The patient is insured through [facility] MEDICARE .   Per test claim: PA required; PA submitted to above mentioned insurance via Latent Key/confirmation #/EOC [id number] Status is pending

## 2024-02-20 DIAGNOSIS — R0989 Other specified symptoms and signs involving the circulatory and respiratory systems: Secondary | ICD-10-CM | POA: Diagnosis not present

## 2024-02-20 DIAGNOSIS — G20B2 Parkinson's disease with dyskinesia, with fluctuations: Secondary | ICD-10-CM | POA: Diagnosis not present

## 2024-02-20 DIAGNOSIS — K5901 Slow transit constipation: Secondary | ICD-10-CM | POA: Diagnosis not present

## 2024-02-20 DIAGNOSIS — F339 Major depressive disorder, recurrent, unspecified: Secondary | ICD-10-CM | POA: Diagnosis not present

## 2024-02-20 DIAGNOSIS — R1312 Dysphagia, oropharyngeal phase: Secondary | ICD-10-CM | POA: Diagnosis not present

## 2024-02-20 DIAGNOSIS — R350 Frequency of micturition: Secondary | ICD-10-CM | POA: Diagnosis not present

## 2024-02-20 DIAGNOSIS — G4700 Insomnia, unspecified: Secondary | ICD-10-CM | POA: Diagnosis not present

## 2024-02-20 DIAGNOSIS — R4789 Other speech disturbances: Secondary | ICD-10-CM | POA: Diagnosis not present

## 2024-02-22 NOTE — Telephone Encounter (Signed)
 Prior Authorization form/request asks a question that REQUIRES YOUR ASSISTANCE. Please see the question below and advise accordingly. The PA will not be submitted until the necessary information is received.   PLEASE BE ADVISED NEED ASSISTANCE TO CONTINUE WITH PA PROCESS

## 2024-02-23 NOTE — Telephone Encounter (Signed)
 PLEASE BE ADVISED Clinical questions have been answered and PA submitted.TO PLAN. PA currently Pending.

## 2024-02-23 NOTE — Telephone Encounter (Signed)
 Copied from CRM [id number]. Topic: Clinical - Prescription Issue >> Feb 23, 2024  9:21 AM [name] wrote: Reason for CRM: Adrian [name] from [facility] states that the testosterone  cypionate (DEPOTESTOSTERONE CYPIONATE) 200 MG/ML injection is denied because pt does not meet criteria. Per information provided about pt health issues the medication was denied. This can be appealled. Information will be faxed over to office

## 2024-02-23 NOTE — Telephone Encounter (Signed)
 Pharmacy Patient Advocate Encounter  Received notification from [facility] MEdD that Prior Authorization for Testosterone  Cypionate 200MG /ML intramuscular solution  has been DENIED.  No reason given; No denial letter received via Fax or CMM. It has been requested and will be uploaded to the media tab once received.   PA #/Case ID/Reference #: [id number]  PLEASE BE ADVISED MESSAGE FROM PLAN DENIED. WE DENIED COVERAGE FOR THIS DRUG BECAUSE TESTOSTERONE  CYPIONATE DID NOT MEET ANDROGENS INJECTABLE PRIOR AUTHORIZATION CRITERIA REQUIREMENTS. Prior Authorization requires members to meet certain criteria set by the plan before a drug is covered. Testosterone  Cypionate is approved when the member has no FDA labeled contraindications to therapy. In this case, the provider stated that the member has serious cardiac, hepatic, or renal disease, which is an FDA labeled contraindication to use Testosterone 

## 2024-02-23 NOTE — Telephone Encounter (Signed)
Yes. Thx.

## 2024-02-24 NOTE — Telephone Encounter (Signed)
 Adrian [name] had 2 abnormal (low) readings of serum testosterone .  Thanks

## 2024-02-28 ENCOUNTER — Ambulatory Visit (INDEPENDENT_AMBULATORY_CARE_PROVIDER_SITE_OTHER): Admitting: Internal Medicine

## 2024-02-28 ENCOUNTER — Encounter: Payer: Self-pay | Admitting: Internal Medicine

## 2024-02-28 VITALS — BP 110/80 | HR 57 | Temp 98.0°F | Ht 68.5 in | Wt 177.0 lb

## 2024-02-28 DIAGNOSIS — E538 Deficiency of other specified B group vitamins: Secondary | ICD-10-CM | POA: Diagnosis not present

## 2024-02-28 DIAGNOSIS — G4700 Insomnia, unspecified: Secondary | ICD-10-CM | POA: Insufficient documentation

## 2024-02-28 DIAGNOSIS — F5101 Primary insomnia: Secondary | ICD-10-CM

## 2024-02-28 DIAGNOSIS — F329 Major depressive disorder, single episode, unspecified: Secondary | ICD-10-CM

## 2024-02-28 DIAGNOSIS — R634 Abnormal weight loss: Secondary | ICD-10-CM

## 2024-02-28 DIAGNOSIS — N183 Chronic kidney disease, stage 3 unspecified: Secondary | ICD-10-CM

## 2024-02-28 MED ORDER — TRAZODONE HCL 50 MG PO TABS: 50.0000 mg | ORAL_TABLET | Freq: Every day | ORAL | 3 refills | Status: AC

## 2024-02-28 NOTE — Assessment & Plan Note (Signed)
 On Vit B12

## 2024-02-28 NOTE — Progress Notes (Signed)
 Subjective:  Patient ID: Adrian [name], male    DOB: 30-Sep-1950  Age: 73 y.o. MRN: [id number]  CC: Follow-up (Patient states trouble sleeping. Patient has information from appointment from [facility]. )   HPI Adrian [name] presents for insomnia - worse, Parkinson's, gout C/o nightmares...  Outpatient Medications Prior to Visit  Medication Sig Dispense Refill   Amantadine HCl 100 MG tablet Take 100 mg by mouth 2 (two) times daily.     BD HYPODERMIC NEEDLE 18G X 1 MISC USE THE NEEDLE TO DRAW TESTOSTERONE  OUT OF THE VIAL WEEKLY 50 each 3   bisacodyl (DULCOLAX) 5 MG EC tablet Take 5 mg by mouth every other day.     cetirizine (ZYRTEC) 10 MG tablet Take 10 mg by mouth daily.     colchicine  0.6 MG tablet Take 1 tablet (0.6 mg total) by mouth daily as needed (gout or psuedogout pain). 30 tablet 2   CREXONT  70-280 MG CPCR Take 2 capsules by mouth 3 (three) times daily.     halobetasol  (ULTRAVATE ) 0.05 % cream Apply topically 2 times daily as needed 45 g 1   levothyroxine  (SYNTHROID ) 75 MCG tablet Take 1 tablet by mouth once daily 90 tablet 0   losartan  (COZAAR ) 100 MG tablet Take 1 tablet by mouth once daily 90 tablet 0   melatonin 5 MG TABS Take 5 mg by mouth at bedtime.     Misc Natural Products (TART CHERRY ADVANCED) CAPS Take 1 capsule by mouth daily.     Multiple Vitamins-Minerals (CENTRUM SILVER  ADULT 50+ PO) Take 1 tablet by mouth at bedtime.     Plecanatide  (TRULANCE ) 3 MG TABS Take 1 tablet (3 mg total) by mouth daily. For constipation 30 tablet 11   Probiotic Product (PROBIOTIC DAILY PO) Take 1 tablet by mouth at bedtime.     rosuvastatin  (CRESTOR ) 10 MG tablet Take 1 tablet by mouth once daily 90 tablet 3   sertraline  (ZOLOFT ) 100 MG tablet Take 1 tablet (100 mg total) by mouth daily. 90 tablet 1   sildenafil  (VIAGRA ) 100 MG tablet Take 0.5-1 tablets (50-100 mg total) by mouth daily as needed for erectile dysfunction. 12 tablet 11   SYRINGE-NEEDLE, DISP, 3 ML (BD ECLIPSE SYRINGE) 25G  X 1 3 ML MISC As directed 50 each 3   testosterone  cypionate (DEPOTESTOSTERONE CYPIONATE) 200 MG/ML injection INJECT 1 ML (200 MG TOTAL) INTRAMUSCULARLY  EVERY 14 DAYS 6 mL 1   triamcinolone  cream (KENALOG) 0.1 % Apply 1 Application topically daily as needed (irritation).     VALERIAN ROOT PO Take 1,500 mg by mouth at bedtime.     traZODone  (DESYREL ) 50 MG tablet TAKE 1 TABLET BY MOUTH AT BEDTIME 90 tablet 3   No facility-administered medications prior to visit.    ROS: Review of Systems  Constitutional:  Positive for unexpected weight change. Negative for appetite change and fatigue.  HENT:  Negative for congestion, nosebleeds, sneezing, sore throat and trouble swallowing.   Eyes:  Negative for itching and visual disturbance.  Respiratory:  Negative for cough.   Cardiovascular:  Negative for chest pain, palpitations and leg swelling.  Gastrointestinal:  Negative for abdominal distention, blood in stool, diarrhea and nausea.  Genitourinary:  Negative for frequency and hematuria.  Musculoskeletal:  Positive for gait problem. Negative for back pain, joint swelling and neck pain.  Skin:  Negative for rash.  Neurological:  Negative for dizziness, tremors, speech difficulty and weakness.  Hematological:  Does not bruise/bleed easily.  Psychiatric/Behavioral:  Positive for decreased concentration and sleep disturbance. Negative for agitation, dysphoric mood, hallucinations and suicidal ideas. The patient is not nervous/anxious.     Objective:  BP 110/80   Pulse (!) 57   Temp 98 F (36.7 C) (Oral)   Ht 5' 8.5 (1.74 m)   Wt 177 lb (80.3 kg)   SpO2 97%   BMI 26.52 kg/m   BP Readings from Last 3 Encounters:  02/28/24 110/80  12/26/23 114/70  11/11/23 120/80    Wt Readings from Last 3 Encounters:  02/28/24 177 lb (80.3 kg)  12/26/23 180 lb 3.2 oz (81.7 kg)  11/11/23 179 lb (81.2 kg)    Physical Exam Constitutional:      General: He is not in acute distress.    Appearance: He  is well-developed.     Comments: NAD  Eyes:     Conjunctiva/sclera: Conjunctivae normal.     Pupils: Pupils are equal, round, and reactive to light.  Neck:     Thyroid : No thyromegaly.     Vascular: No JVD.  Cardiovascular:     Rate and Rhythm: Normal rate and regular rhythm.     Heart sounds: Normal heart sounds. No murmur heard.    No friction rub. No gallop.  Pulmonary:     Effort: Pulmonary effort is normal. No respiratory distress.     Breath sounds: Normal breath sounds. No wheezing or rales.  Chest:     Chest wall: No tenderness.  Abdominal:     General: Bowel sounds are normal. There is no distension.     Palpations: Abdomen is soft. There is no mass.     Tenderness: There is no abdominal tenderness. There is no guarding or rebound.  Musculoskeletal:        General: No tenderness. Normal range of motion.     Cervical back: Normal range of motion.     Right lower leg: No edema.     Left lower leg: No edema.  Lymphadenopathy:     Cervical: No cervical adenopathy.  Skin:    General: Skin is warm and dry.     Findings: No rash.  Neurological:     Mental Status: He is alert and oriented to person, place, and time.     Cranial Nerves: No cranial nerve deficit.     Motor: No abnormal muscle tone.     Coordination: Coordination abnormal.     Gait: Gait abnormal.     Deep Tendon Reflexes: Reflexes are normal and symmetric.  Psychiatric:        Behavior: Behavior normal.        Thought Content: Thought content normal.        Judgment: Judgment normal.   Mild tremor  Lab Results  Component Value Date   WBC 4.8 11/11/2023   HGB 16.1 11/11/2023   HCT 48.0 11/11/2023   PLT 156.0 11/11/2023   GLUCOSE 101 (H) 11/11/2023   CHOL 75 (L) 12/26/2023   TRIG 123 12/26/2023   HDL 37 (L) 12/26/2023   LDLDIRECT 66.0 05/10/2016   LDLCALC 16 12/26/2023   ALT 4 11/11/2023   AST 16 11/11/2023   NA 142 11/11/2023   K 5.1 11/11/2023   CL 106 11/11/2023   CREATININE 1.42  11/11/2023   BUN 19 11/11/2023   CO2 30 11/11/2023   TSH 3.26 11/11/2023   PSA 1.05 06/04/2022   INR 1.0 RATIO 09/18/2007   HGBA1C 5.6 06/20/2019    MR Angiogram Chest W Wo Contrast Result  Date: 11/17/2022 CLINICAL DATA:  aortic aneurysm EXAM: MRA CHEST WITH OR WITHOUT CONTRAST TECHNIQUE: Angiographic images of the chest were obtained using MRA technique without and with intravenous contrast. CONTRAST:  8mL GADAVIST  GADOBUTROL  1 MMOL/ML IV SOLN COMPARISON:  March 2023 FINDINGS: Cardiovascular: Preferential opacification of the thoracic aorta. Ascending thoracic aortic aneurysm measures 4.2 cm, previously estimated 4.3 cm. Normal heart size. No pericardial effusion. Mediastinum/Nodes: No enlarged mediastinal, hilar, or axillary lymph nodes. Lungs/Pleura: No pleural effusion. No pneumothorax. No mass or lobar consolidation. Musculoskeletal: No aggressive osseous lesions. Upper abdomen: Similar appearance of nonenhancing simple appearing hepatic cyst in segment 4B of the liver, which requires no further imaging workup. The visualized upper abdomen is otherwise unremarkable. IMPRESSION: VASCULAR 1. Similar appearance of ascending thoracic aortic aneurysm measuring 4.2 cm on today's exam. Recommend annual imaging followup by CTA or MRA. This recommendation follows 2010 ACCF/AHA/AATS/ACR/ASA/SCA/SCAI/SIR/STS/SVM Guidelines for the Diagnosis and Management of Patients with Thoracic Aortic Disease. Circulation. 2010; 121: Z733-z630. Aortic aneurysm NOS (ICD10-I71.9) NON-VASCULAR 1. No acute extravascular findings in the chest. Electronically Signed   By: Felton Fry M.D.   On: 11/17/2022 09:45    Assessment & Plan:   Problem List Items Addressed This Visit     B12 deficiency - Primary   On Vit B12      CRF (chronic renal failure), stage 3 (moderate)   Dr Joline q 6 mo      Depression   Cont w/Zoloft       Relevant Medications   traZODone  (DESYREL ) 50 MG tablet   Insomnia disorder    Likely related to Parkinson's Trazodone  50-100 mg at bedtime prn D/c Melatonin, Valerian      Weight loss   Unclear etiology  Wt Readings from Last 3 Encounters:  02/28/24 177 lb (80.3 kg)  12/26/23 180 lb 3.2 oz (81.7 kg)  11/11/23 179 lb (81.2 kg)            Meds ordered this encounter  Medications   traZODone  (DESYREL ) 50 MG tablet    Sig: Take 1-2 tablets (50-100 mg total) by mouth at bedtime.    Dispense:  180 tablet    Refill:  3      Follow-up: Return in about 3 months (around 05/29/2024) for a follow-up visit.  Marolyn Noel, MD

## 2024-02-28 NOTE — Assessment & Plan Note (Signed)
Cont w/Zoloft 

## 2024-02-28 NOTE — Assessment & Plan Note (Signed)
Dr Rayvon Char q 6 mo

## 2024-02-28 NOTE — Assessment & Plan Note (Signed)
 Likely related to Parkinson's Trazodone  50-100 mg at bedtime prn D/c Melatonin, Valerian

## 2024-02-28 NOTE — Assessment & Plan Note (Signed)
 Unclear etiology  Wt Readings from Last 3 Encounters:  02/28/24 177 lb (80.3 kg)  12/26/23 180 lb 3.2 oz (81.7 kg)  11/11/23 179 lb (81.2 kg)

## 2024-02-29 ENCOUNTER — Telehealth: Payer: Self-pay | Admitting: Pharmacist

## 2024-02-29 ENCOUNTER — Telehealth: Payer: Self-pay

## 2024-02-29 NOTE — Telephone Encounter (Signed)
 Copied from CRM #[id number]. Topic: Clinical - Medication Prior Auth >> Feb 29, 2024 11:45 AM Drema MATSU wrote: Reason for CRM: Suzen with [facility] recieved appeal and is going to process it for testosterone  cypionate (DEPOTESTOSTERONE CYPIONATE) 200 MG/ML injection. She stated that there will be a 7 day turn around.  If there is any additonal clinicals needed to fax to [phone]. They will also call when a decision is made.

## 2024-02-29 NOTE — Telephone Encounter (Signed)
 Appeal has been submitted. Will advise when response is received, please be advised that most companies may take 30 days to make a decision. Appeal letter and supporting documentation have been faxed to [phone] on 02/29/2024 @10 :59am.  Thank you, Devere Pandy, PharmD Clinical Pharmacist  [place]  Direct Dial: [phone]

## 2024-03-01 ENCOUNTER — Other Ambulatory Visit (HOSPITAL_COMMUNITY): Payer: Self-pay

## 2024-03-01 NOTE — Telephone Encounter (Unsigned)
 Copied from CRM [id number]. Topic: Clinical - Medication Prior Auth >> Mar 01, 2024 11:19 AM Delon DASEN wrote: Reason for CRM: Shay with [employer] [place]- Testosterone  has been approved until 03/01/2025- fax sent

## 2024-03-01 NOTE — Telephone Encounter (Signed)
 The appeal for testosterone  has been approved by the insurance through 02/28/2025.

## 2024-03-04 ENCOUNTER — Other Ambulatory Visit: Payer: Self-pay | Admitting: Internal Medicine

## 2024-03-06 ENCOUNTER — Ambulatory Visit (INDEPENDENT_AMBULATORY_CARE_PROVIDER_SITE_OTHER): Admitting: Podiatry

## 2024-03-06 ENCOUNTER — Encounter: Payer: Self-pay | Admitting: Podiatry

## 2024-03-06 DIAGNOSIS — B351 Tinea unguium: Secondary | ICD-10-CM | POA: Diagnosis not present

## 2024-03-06 DIAGNOSIS — M2041 Other hammer toe(s) (acquired), right foot: Secondary | ICD-10-CM

## 2024-03-06 DIAGNOSIS — M79674 Pain in right toe(s): Secondary | ICD-10-CM | POA: Diagnosis not present

## 2024-03-06 DIAGNOSIS — M79675 Pain in left toe(s): Secondary | ICD-10-CM

## 2024-03-06 NOTE — Progress Notes (Signed)
  Subjective:  Patient ID: Adrian [name], male    DOB: July 17, 1950,  MRN: [id number]  Chief Complaint  Patient presents with   Nail Problem    RM13 Nail fungus bilateral toenails want to discuss treatment options/several years with no treatment.    Discussed the use of AI scribe software for clinical note transcription with the patient, who gave verbal consent to proceed.  History of Present Illness Adrian [name] is a 73 year old male with nail fungus and Parkinson's disease who presents with nail problems and callus formation.  He has ongoing nail fungus affecting his toes, causing the nails to lift without drainage or fluid. The nails are thick and difficult to trim. Previous nail removal attempts were unsuccessful.  He has a callus on the second toe of his right foot, which is painful, especially when bumped. There is a history of a bunion which did well but he has a pain on the right second hammertoe.  Asking if this could be corrected as it causes discomfort and rub inside of his shoes.       Objective:  There were no vitals filed for this visit.  Physical Exam General: AAO x3, NAD  Dermatological: Nails appear to be hypertrophic, dystrophic with yellow, slight brown discoloration.  There is no hyperpigmentation.  There is tenderness to nails 1-5 bilaterally.  For the nails that been removed previously have come back and bleed partly causing discomfort.  Hyperkeratotic lesions along the distal portions majority of toes most of the right second toe.  No ulcerations.  Vascular: Dorsalis Pedis artery and Posterior Tibial artery pedal pulses are 2/4 bilateral with immedate capillary fill time. There is no pain with calf compression, swelling, warmth, erythema.   Neruologic: Grossly intact via light touch bilateral.  Musculoskeletal: Rigid hammertoe contracture present to right second toe with tenderness on the PIPJ.  Gait: Unassisted, Nonantalgic.     No images are attached  to the encounter.    Results     Assessment:   1. Hammertoe of right foot   2. Dermatophytosis of nail   3. Pain in toes of both feet      Plan:  Patient was evaluated and treated and all questions answered.  Assessment and Plan Assessment & Plan Onychomycosis of toenails with associated nail dystrophy and prior nail avulsion Chronic onychomycosis with nail dystrophy and history of multiple nail avulsions with recurrence of the toenails - Sharply debrided nails x 10 without any complications or bleeding. - Prescribe compounded topical antifungal with urea from specialty pharmacy.  Ordered through [employer].  Callus of right second toe Recurrent callus formation due to pressure and friction. - Trim callus on right second toe. - Apply compounded topical medication with urea.  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 Surgical intervention considered to alleviate pain and correct deformity. - Consider surgical intervention to correct hammer toe deformity.  We discussed that surgery is less postoperative course.  He will consider options.   Return in about 3 months (around 06/06/2024) for nail trim.   Donnice JONELLE Fees DPM

## 2024-03-06 NOTE — Patient Instructions (Signed)
 I have ordered a medication for you that will come from [facility] in [place]. They should be calling you to verify insurance and will mail the medication to you. If you live close by then you can go by their pharmacy to pick up the medication. Their phone number is [phone]. If you do not hear from them in the next few days, please give [place] a call at [phone].

## 2024-03-08 NOTE — Telephone Encounter (Signed)
 Called and informed patient of appeal being approved. Patient expressed understanding

## 2024-03-30 ENCOUNTER — Other Ambulatory Visit: Payer: Self-pay | Admitting: Internal Medicine

## 2024-04-02 ENCOUNTER — Ambulatory Visit (INDEPENDENT_AMBULATORY_CARE_PROVIDER_SITE_OTHER): Admitting: Audiology

## 2024-04-02 ENCOUNTER — Ambulatory Visit (INDEPENDENT_AMBULATORY_CARE_PROVIDER_SITE_OTHER): Admitting: Otolaryngology

## 2024-04-20 ENCOUNTER — Other Ambulatory Visit: Payer: Self-pay | Admitting: Internal Medicine

## 2024-04-23 ENCOUNTER — Other Ambulatory Visit: Payer: Self-pay | Admitting: Internal Medicine

## 2024-05-07 DIAGNOSIS — K080 Exfoliation of teeth due to systemic causes: Secondary | ICD-10-CM | POA: Diagnosis not present

## 2024-05-07 DIAGNOSIS — G20A1 Parkinson's disease without dyskinesia, without mention of fluctuations: Secondary | ICD-10-CM | POA: Diagnosis not present

## 2024-05-07 DIAGNOSIS — R262 Difficulty in walking, not elsewhere classified: Secondary | ICD-10-CM | POA: Diagnosis not present

## 2024-05-07 DIAGNOSIS — G20B2 Parkinson's disease with dyskinesia, with fluctuations: Secondary | ICD-10-CM | POA: Diagnosis not present

## 2024-06-04 ENCOUNTER — Ambulatory Visit: Admitting: Internal Medicine

## 2024-06-05 ENCOUNTER — Ambulatory Visit: Admitting: Podiatry

## 2024-06-27 ENCOUNTER — Other Ambulatory Visit: Payer: Self-pay | Admitting: Internal Medicine
